# Patient Record
Sex: Female | Born: 1950 | Race: White | Hispanic: No | Smoking: Former smoker
Health system: Southern US, Community
[De-identification: ages and names within clinical notes are randomized; demographics above are authoritative.]

## PROBLEM LIST (undated history)

## (undated) DIAGNOSIS — E119 Type 2 diabetes mellitus without complications: Secondary | ICD-10-CM

## (undated) DIAGNOSIS — T7840XA Allergy, unspecified, initial encounter: Secondary | ICD-10-CM

## (undated) DIAGNOSIS — R011 Cardiac murmur, unspecified: Secondary | ICD-10-CM

## (undated) DIAGNOSIS — M1990 Unspecified osteoarthritis, unspecified site: Secondary | ICD-10-CM

## (undated) DIAGNOSIS — I10 Essential (primary) hypertension: Secondary | ICD-10-CM

## (undated) HISTORY — DX: Unspecified osteoarthritis, unspecified site: M19.90

## (undated) HISTORY — DX: Cardiac murmur, unspecified: R01.1

## (undated) HISTORY — PX: TONSILLECTOMY: SUR1361

## (undated) HISTORY — DX: Type 2 diabetes mellitus without complications: E11.9

## (undated) HISTORY — DX: Essential (primary) hypertension: I10

## (undated) HISTORY — DX: Allergy, unspecified, initial encounter: T78.40XA

## (undated) HISTORY — PX: MEDIAL PARTIAL KNEE REPLACEMENT: SHX5965

## (undated) HISTORY — PX: JOINT REPLACEMENT: SHX530

## (undated) HISTORY — PX: HAMMER TOE SURGERY: SHX385

---

## 2014-09-30 LAB — BASIC METABOLIC PANEL
BUN: 18 mg/dL (ref 4–21)
Creatinine: 0.7 mg/dL (ref 0.5–1.1)
Glucose: 123 mg/dL
Potassium: 4.9 mmol/L (ref 3.4–5.3)
SODIUM: 143 mmol/L (ref 137–147)

## 2014-09-30 LAB — VITAMIN D 25 HYDROXY (VIT D DEFICIENCY, FRACTURES): Vit D, 25-Hydroxy: 14.1

## 2014-09-30 LAB — HEMOGLOBIN A1C: Hemoglobin A1C: 6.6

## 2014-09-30 LAB — CBC AND DIFFERENTIAL
HCT: 41 % (ref 36–46)
HEMOGLOBIN: 14.1 g/dL (ref 12.0–16.0)
NEUTROS ABS: 6 /uL
PLATELETS: 288 10*3/uL (ref 150–399)
WBC: 8.6 10*3/mL

## 2014-09-30 LAB — HEPATIC FUNCTION PANEL
ALK PHOS: 55 U/L (ref 25–125)
ALT: 35 U/L (ref 7–35)
AST: 33 U/L (ref 13–35)
Bilirubin, Total: 0.7 mg/dL

## 2014-09-30 LAB — LIPID PANEL
CHOLESTEROL: 211 mg/dL — AB (ref 0–200)
HDL: 44 mg/dL (ref 35–70)
LDL CALC: 130 mg/dL
TRIGLYCERIDES: 185 mg/dL — AB (ref 40–160)

## 2014-09-30 LAB — TSH: TSH: 2.62 u[IU]/mL (ref 0.41–5.90)

## 2014-11-11 LAB — HM DEXA SCAN: HM Dexa Scan: NORMAL

## 2016-08-11 LAB — BASIC METABOLIC PANEL
BUN: 21 mg/dL (ref 4–21)
Creatinine: 0.8 mg/dL (ref 0.5–1.1)
GLUCOSE: 109 mg/dL
POTASSIUM: 4.4 mmol/L (ref 3.4–5.3)
Sodium: 138 mmol/L (ref 137–147)

## 2016-08-11 LAB — TSH: TSH: 3.23 u[IU]/mL (ref 0.41–5.90)

## 2016-08-11 LAB — CBC AND DIFFERENTIAL
HEMATOCRIT: 43 % (ref 36–46)
Hemoglobin: 15 g/dL (ref 12.0–16.0)
NEUTROS ABS: 5 /uL
PLATELETS: 284 10*3/uL (ref 150–399)
WBC: 7.3 10^3/mL

## 2016-08-11 LAB — HEMOGLOBIN A1C: Hemoglobin A1C: 7.4

## 2016-08-11 LAB — HEPATIC FUNCTION PANEL
ALT: 37 U/L — AB (ref 7–35)
AST: 33 U/L (ref 13–35)
Alkaline Phosphatase: 62 U/L (ref 25–125)
Bilirubin, Total: 0.9 mg/dL

## 2016-08-11 LAB — LIPID PANEL
CHOLESTEROL: 199 mg/dL (ref 0–200)
HDL: 38 mg/dL (ref 35–70)
LDL CALC: 122 mg/dL
Triglycerides: 193 mg/dL — AB (ref 40–160)

## 2016-08-11 LAB — VITAMIN D 25 HYDROXY (VIT D DEFICIENCY, FRACTURES): Vit D, 25-Hydroxy: 16.8

## 2016-11-24 ENCOUNTER — Emergency Department
Admission: EM | Admit: 2016-11-24 | Discharge: 2016-11-24 | Disposition: A | Discharge status: Home / Self Care | Payer: Managed Care, Other (non HMO) | Source: Home / Self Care | Attending: Family Medicine | Admitting: Family Medicine

## 2016-11-24 ENCOUNTER — Encounter: Payer: Self-pay | Admitting: Emergency Medicine

## 2016-11-24 DIAGNOSIS — Z23 Encounter for immunization: Secondary | ICD-10-CM

## 2016-11-24 DIAGNOSIS — L03032 Cellulitis of left toe: Secondary | ICD-10-CM | POA: Diagnosis not present

## 2016-11-24 MED ORDER — TETANUS-DIPHTH-ACELL PERTUSSIS 5-2.5-18.5 LF-MCG/0.5 IM SUSP
0.5000 mL | Freq: Once | INTRAMUSCULAR | Status: AC
  Administered 2016-11-24: 0.5 mL via INTRAMUSCULAR

## 2016-11-24 MED ORDER — CLINDAMYCIN HCL 300 MG PO CAPS: 300.0000 mg | ORAL_CAPSULE | Freq: Three times a day (TID) | ORAL | 0 refills | Status: DC

## 2016-11-24 MED ORDER — MUPIROCIN 2 % EX OINT: 1.0000 "application " | TOPICAL_OINTMENT | Freq: Three times a day (TID) | CUTANEOUS | 0 refills | Status: DC

## 2016-11-24 NOTE — Discharge Instructions (Signed)
Change bandage daily.  Elevate foot/leg.  May apply heating pad 2 tor 3 times daily. If symptoms become significantly worse during the night or over the weekend, proceed to the local emergency room.

## 2016-11-24 NOTE — ED Triage Notes (Signed)
Pt hit his left pinky toe getting out of the bed this morning, painful and some swelling.

## 2016-11-24 NOTE — ED Provider Notes (Signed)
[facility]    CSN: [id number] Arrival date & time: 11/24/16  1400     History   Chief Complaint Chief Complaint  Patient presents with  . Toe Pain    HPI Teresa Spence is a 66 y.o. female.   Patient just moved to the area from [place].  She was unpacking boxes earlier this week, wearing "sneakers."  Four days ago she felt a pinching sensation in her left fifth toe and later noticed a dorsal blister.  The blister has persisted and continues to drain.  Today she had increased soreness and some redness on her left dorsal foot.  She recalls having some chills and fatigue two days ago.  She notes that she has chronic decreased sensation in her feet. She has a history of hypertension and type 2 diabetes.  She has not yet established care with a local PCP. Past history of resection neuroma left foot.   The history is provided by the patient.  Foot Pain  This is a new problem. The current episode started more than 2 days ago. The problem occurs constantly. The problem has been rapidly worsening. The symptoms are aggravated by walking. Nothing relieves the symptoms. Treatments tried: bandage. The treatment provided no relief.    History reviewed. No pertinent past medical history.  There are no active problems to display for this patient.   Past Surgical History:  Procedure Laterality Date  . MEDIAL PARTIAL KNEE REPLACEMENT      OB History    No data available       Home Medications    Prior to Admission medications   Medication Sig Start Date End Date Taking? Authorizing Provider  BISOPROLOL FUMARATE PO Take by mouth.   Yes Historical Provider, MD  Losartan Potassium-HCTZ (HYZAAR PO) Take by mouth.   Yes Historical Provider, MD  meloxicam (MOBIC) 15 MG tablet Take 15 mg by mouth daily.   Yes Historical Provider, MD  metFORMIN (GLUCOPHAGE) 1000 MG tablet Take 1,000 mg by mouth 2 (two) times daily with a meal.   Yes Historical Provider, MD  clindamycin (CLEOCIN) 300  MG capsule Take 1 capsule (300 mg total) by mouth 3 (three) times daily. 11/24/16   Kandra Nicolas, MD  mupirocin ointment (BACTROBAN) 2 % Apply 1 application topically 3 (three) times daily. 11/24/16   Kandra Nicolas, MD    Family History Family History  Problem Relation Age of Onset  . Heart attack Brother   . Emphysema Neg Hx     Social History Social History  Substance Use Topics  . Smoking status: Former Research scientist (life sciences)  . Smokeless tobacco: Never Used  . Alcohol use Yes     Comment: 1-2 weekly     Allergies   Patient has no known allergies.   Review of Systems Review of Systems  Constitutional: Positive for chills and fatigue. Negative for activity change, appetite change, diaphoresis and fever.  Neurological: Positive for numbness.  All other systems reviewed and are negative.    Physical Exam Triage Vital Signs ED Triage Vitals  Enc Vitals Group     BP 11/24/16 1431 138/73     Pulse Rate 11/24/16 1431 75     Resp --      Temp 11/24/16 1431 98.3 F (36.8 C)     Temp Source 11/24/16 1431 Oral     SpO2 11/24/16 1431 93 %     Weight 11/24/16 1432 199 lb 12 oz (90.6 kg)     Height  11/24/16 1432 5\' 7"  (1.702 m)     Head Circumference --      Peak Flow --      Pain Score 11/24/16 1436 0     Pain Loc --      Pain Edu? --      Excl. in [place]? --    No data found.   Updated Vital Signs BP (!) 143/94 (BP Location: Left Arm)   Pulse 72   Temp 98.1 F (36.7 C) (Oral)   Ht 5\' 11"  (1.803 m)   Wt 272 lb 4 oz (123.5 kg)   SpO2 93%   BMI 37.97 kg/m   Visual Acuity Right Eye Distance:   Left Eye Distance:   Bilateral Distance:    Right Eye Near:   Left Eye Near:    Bilateral Near:     Physical Exam  Constitutional: She appears well-developed and well-nourished. No distress.  HENT:  Head: Normocephalic.  Mouth/Throat: Oropharynx is clear and moist.  Eyes: Pupils are equal, round, and reactive to light.  Cardiovascular: Normal rate.   Pulmonary/Chest: Effort  normal.  Musculoskeletal:       Left foot: There is swelling and decreased capillary refill. There is normal range of motion, no tenderness and no bony tenderness.       Feet:  Dorsum of left fifth toe has large bulla containing cloudy fluid.  Erythema extends onto dorsum of distal foot as noted on diagram.  Mild generalized edema of foot.  Neurological: She is alert.  Skin: Skin is warm and dry.  Nursing note and vitals reviewed.    UC Treatments / Results  Labs (all labs ordered are listed, but only abnormal results are displayed) Labs Reviewed  WOUND CULTURE    EKG  EKG Interpretation None       Radiology No results found.  Procedures Procedures  Debridement of left fifth toe.   With sterile technique, cleaned left fifth toe with Betadine and lavaged with saline.  Debrided bulla from dorsum of toe.  Applied Bacitracin ointment followed by wrap of xeroform gauze and Kerlix gauze. Wound culture pending.  Medications Ordered in UC Medications  Tdap (BOOSTRIX) injection 0.5 mL (not administered)     Initial Impression / Assessment and Plan / UC Course  I have reviewed the triage vital signs and the nursing notes.  Pertinent labs & imaging results that were available during my care of the patient were reviewed by me and considered in my medical decision making (see chart for details).    Wound culture pending.  Begin Clindamycin and topical bactroban ointment. Administered Tdap  Change bandage daily.  Elevate foot/leg.  May apply heating pad 2 tor 3 times daily. If symptoms become significantly worse during the night or over the weekend, proceed to the local emergency room.  Followup with Family Doctor in 75 hours (or may return to urgent care clinic). Followup with PCP to establish care.    Final Clinical Impressions(s) / UC Diagnoses   Final diagnoses:  Cellulitis of small toe of left foot    New Prescriptions New Prescriptions   CLINDAMYCIN (CLEOCIN) 300  MG CAPSULE    Take 1 capsule (300 mg total) by mouth 3 (three) times daily.   MUPIROCIN OINTMENT (BACTROBAN) 2 %    Apply 1 application topically 3 (three) times daily.     Kandra Nicolas, MD 11/24/16 [phone]

## 2016-11-24 NOTE — ED Triage Notes (Signed)
Pt c/o a large blister on left pinky toe x 3 days, no real pain, some bleeding and redness.

## 2016-11-26 ENCOUNTER — Emergency Department (INDEPENDENT_AMBULATORY_CARE_PROVIDER_SITE_OTHER)
Admission: EM | Admit: 2016-11-26 | Discharge: 2016-11-26 | Disposition: A | Discharge status: Home / Self Care | Payer: Managed Care, Other (non HMO) | Source: Home / Self Care | Attending: Family Medicine | Admitting: Family Medicine

## 2016-11-26 DIAGNOSIS — Z4800 Encounter for change or removal of nonsurgical wound dressing: Secondary | ICD-10-CM

## 2016-11-26 DIAGNOSIS — Z5189 Encounter for other specified aftercare: Secondary | ICD-10-CM | POA: Diagnosis not present

## 2016-11-26 NOTE — ED Provider Notes (Signed)
CSN: [id number]     Arrival date & time 11/26/16  9735 History   First MD Initiated Contact with Patient 11/26/16 [phone]     Chief Complaint  Patient presents with  . Wound Check   (Consider location/radiation/quality/duration/timing/severity/associated sxs/prior Treatment) HPI Teresa Spence is a 66 y.o. female presenting to UC for a wound recheck and bandage change.  She was seen at [facility] on 11/24/16 with a large blister to her Left little toe as well as redness of foot, fatigue, and chills.  She has been taking Clindamycin and using mupirocin ointment with mild relief. Chills and fatigue have seemed to resolve and wound appears to be a little more dry than it was but the redness looks about the same.  She recently moved from [place] and is in the process of moving so it is difficult for her to find time to keep her foot elevated.  She is leaving later today for a trip to [place] but will be back by the end of the week. No other concerns.    History reviewed. No pertinent past medical history. Past Surgical History:  Procedure Laterality Date  . MEDIAL PARTIAL KNEE REPLACEMENT     Family History  Problem Relation Age of Onset  . Heart attack Brother   . Emphysema Neg Hx    Social History  Substance Use Topics  . Smoking status: Former Research scientist (life sciences)  . Smokeless tobacco: Never Used  . Alcohol use Yes     Comment: 1-2 weekly   OB History    No data available     Review of Systems  Constitutional: Negative for chills, fatigue and fever.  Gastrointestinal: Negative for nausea and vomiting.  Skin: Positive for color change and wound.    Allergies  Patient has no known allergies.  Home Medications   Prior to Admission medications   Medication Sig Start Date End Date Taking? Authorizing Provider  BISOPROLOL FUMARATE PO Take by mouth.    Historical Provider, MD  clindamycin (CLEOCIN) 300 MG capsule Take 1 capsule (300 mg total) by mouth 3 (three) times daily. 11/24/16   Kandra Nicolas, MD   Losartan Potassium-HCTZ (HYZAAR PO) Take by mouth.    Historical Provider, MD  meloxicam (MOBIC) 15 MG tablet Take 15 mg by mouth daily.    Historical Provider, MD  metFORMIN (GLUCOPHAGE) 1000 MG tablet Take 1,000 mg by mouth 2 (two) times daily with a meal.    Historical Provider, MD  mupirocin ointment (BACTROBAN) 2 % Apply 1 application topically 3 (three) times daily. 11/24/16   Kandra Nicolas, MD   Meds Ordered and Administered this Visit  Medications - No data to display  BP 134/84 (BP Location: Left Arm)   Pulse 69   Temp 97.9 F (36.6 C) (Oral)   Ht 5\' 7"  (1.702 m)   Wt 199 lb (90.3 kg)   SpO2 97%   BMI 31.17 kg/m  No data found.   Physical Exam  Constitutional: She appears well-developed and well-nourished. No distress.  Cardiovascular: Normal rate.   Pulmonary/Chest: Effort normal. No respiratory distress.  Musculoskeletal: Normal range of motion. She exhibits no edema or tenderness.  Skin: Capillary refill takes less than 2 seconds. She is not diaphoretic. There is erythema.  Left foot, dorsal aspect: faint erythema over distal aspect of 3rd-5th metatarsals and Left 5th toe. Shallow open sore on Left 5th toe. Scant clear discharge. Non-tender. No induration or fluctuance.   Nursing note and vitals reviewed.   Urgent Care  Course     Procedures (including critical care time)  Labs Review Labs Reviewed - No data to display  Imaging Review No results found.  Old bandage removed, wound appears to be healing well.  Mupirocin ointment applied, covered with Xeroform and gauze.     MDM   1. Visit for wound check   2. Dressing change    Wound appears to be healing well. Dressing changed. Home care instructions provided. Pt will be out of town for 1 week. Encouraged to follow up with a medical provider when out of town if symptoms worsening. She may return to this urgent care as needed if not improving by the end of the week.     Noland Fordyce, PA-C 11/26/16  [phone]

## 2016-11-26 NOTE — Discharge Instructions (Signed)
° °  Be sure to  change the dressing at least once daily, more often if the bandage becomes dirty or wet.  You should also try to elevate your foot at least twice daily for 15-20 minutes at a time as well as apply a warm compress to help with blood flow and wound healing.   Please take antibiotics as prescribed and be sure to complete entire course even if you start to feel better to ensure infection does not come back.  If your wound appears to be healing well without concern, no need to return. If you are concerned it is not improving by the time you get back from your trip, either follow up with a medical provider in your area or you may come back to this urgent care when you return.

## 2016-11-26 NOTE — ED Triage Notes (Signed)
Pt seen here on the 17th with blister on left little toe.  Here today for a recheck.

## 2016-11-27 ENCOUNTER — Telehealth: Payer: Self-pay | Admitting: *Deleted

## 2016-11-27 LAB — WOUND CULTURE

## 2016-11-27 NOTE — Telephone Encounter (Signed)
Spoke to pt given Wcx results. She reports that her wound is looking better today.

## 2016-12-17 ENCOUNTER — Ambulatory Visit: Payer: Managed Care, Other (non HMO) | Admitting: Physician Assistant

## 2016-12-27 ENCOUNTER — Encounter: Payer: Self-pay | Admitting: Physician Assistant

## 2016-12-27 ENCOUNTER — Ambulatory Visit (INDEPENDENT_AMBULATORY_CARE_PROVIDER_SITE_OTHER): Payer: Managed Care, Other (non HMO) | Admitting: Physician Assistant

## 2016-12-27 ENCOUNTER — Ambulatory Visit (INDEPENDENT_AMBULATORY_CARE_PROVIDER_SITE_OTHER): Payer: Managed Care, Other (non HMO)

## 2016-12-27 VITALS — BP 138/77 | HR 88 | Ht 67.5 in | Wt 200.0 lb

## 2016-12-27 DIAGNOSIS — E119 Type 2 diabetes mellitus without complications: Secondary | ICD-10-CM

## 2016-12-27 DIAGNOSIS — R002 Palpitations: Secondary | ICD-10-CM | POA: Insufficient documentation

## 2016-12-27 DIAGNOSIS — M25572 Pain in left ankle and joints of left foot: Secondary | ICD-10-CM

## 2016-12-27 DIAGNOSIS — L03032 Cellulitis of left toe: Secondary | ICD-10-CM

## 2016-12-27 DIAGNOSIS — I10 Essential (primary) hypertension: Secondary | ICD-10-CM | POA: Diagnosis not present

## 2016-12-27 DIAGNOSIS — Z79899 Other long term (current) drug therapy: Secondary | ICD-10-CM

## 2016-12-27 DIAGNOSIS — IMO0002 Reserved for concepts with insufficient information to code with codable children: Secondary | ICD-10-CM | POA: Insufficient documentation

## 2016-12-27 DIAGNOSIS — Z131 Encounter for screening for diabetes mellitus: Secondary | ICD-10-CM | POA: Diagnosis not present

## 2016-12-27 DIAGNOSIS — R0989 Other specified symptoms and signs involving the circulatory and respiratory systems: Secondary | ICD-10-CM

## 2016-12-27 DIAGNOSIS — E1122 Type 2 diabetes mellitus with diabetic chronic kidney disease: Secondary | ICD-10-CM | POA: Insufficient documentation

## 2016-12-27 DIAGNOSIS — R230 Cyanosis: Secondary | ICD-10-CM

## 2016-12-27 DIAGNOSIS — M150 Primary generalized (osteo)arthritis: Secondary | ICD-10-CM

## 2016-12-27 DIAGNOSIS — Z13220 Encounter for screening for lipoid disorders: Secondary | ICD-10-CM

## 2016-12-27 DIAGNOSIS — M159 Polyosteoarthritis, unspecified: Secondary | ICD-10-CM

## 2016-12-27 MED ORDER — MELOXICAM 15 MG PO TABS: 15.0000 mg | ORAL_TABLET | Freq: Every day | ORAL | 5 refills | Status: DC

## 2016-12-27 NOTE — Patient Instructions (Signed)
Palpitations A palpitation is the feeling that your heart:  Has an uneven (irregular) heartbeat.  Is beating faster than normal.  Is fluttering.  Is skipping a beat. This is usually not a serious problem. In some cases, you may need more medical tests. Follow these instructions at home:  Avoid:  Caffeine in coffee, tea, soft drinks, diet pills, and energy drinks.  Chocolate.  Alcohol.  Do not use any tobacco products. These include cigarettes, chewing tobacco, and e-cigarettes. If you need help quitting, ask your doctor.  Try to reduce your stress. These things may help:  Yoga.  Meditation.  Physical activity. Swimming, jogging, and walking are good choices.  A method that helps you use your mind to control things in your body, like heartbeats (biofeedback).  Get plenty of rest and sleep.  Take over-the-counter and prescription medicines only as told by your doctor.  Keep all follow-up visits as told by your doctor. This is important. Contact a doctor if:  Your heartbeat is still fast or uneven after 24 hours.  Your palpitations occur more often. Get help right away if:  You have chest pain.  You feel short of breath.  You have a very bad headache.  You feel dizzy.  You pass out (faint). This information is not intended to replace advice given to you by your health care provider. Make sure you discuss any questions you have with your health care provider. Document Released: 06/05/2008 Document Revised: 02/02/2016 Document Reviewed: 05/12/2015 Elsevier Interactive Patient Education  2017 [employer].

## 2016-12-27 NOTE — Progress Notes (Signed)
Call pt: lots of osteoarthritis but no acute process detected.

## 2016-12-27 NOTE — Progress Notes (Signed)
Subjective:    Patient ID: Teresa Spence, female    DOB: [birth year], 66 y.o.   MRN: [id number]  HPI  Pt is a 66 yo female who presents to the clinic to establish care.   .. Active Ambulatory Problems    Diagnosis Date Noted  . Hypertension 12/27/2016  . Type 2 diabetes mellitus without complication, without long-term current use of insulin ([place]) 12/27/2016  . Palpitations 12/27/2016   Resolved Ambulatory Problems    Diagnosis Date Noted  . No Resolved Ambulatory Problems   Past Medical History:  Diagnosis Date  . Diabetes mellitus without complication ([place])   . Hypertension    .[name] Family History  Problem Relation Age of Onset  . Heart attack Brother   . Diabetes Mother   . Hypertension Mother   . Hypertension Father   . COPD Father   . Stroke Maternal Grandmother   . Heart attack Maternal Grandfather   . Emphysema Neg Hx    .[name] Social History   Social History  . Marital status: Married    Spouse name: N/A  . Number of children: N/A  . Years of education: N/A   Occupational History  . Not on file.   Social History Main Topics  . Smoking status: Former Research scientist (life sciences)  . Smokeless tobacco: Never Used  . Alcohol use Yes     Comment: 1-2 weekly  . Drug use: No  . Sexual activity: Yes   Other Topics Concern  . Not on file   Social History Narrative  . No narrative on file    She comes in to have her left pinky toe looked at due to recent cellulitis. Finished abx. No pain. She is a DM controlled with metformin.   She does need mobic refilled for OA all over body in knees, hands, feet.     Review of Systems  All other systems reviewed and are negative.      Objective:   Physical Exam  Constitutional: She appears well-developed and well-nourished.  HENT:  Head: Normocephalic and atraumatic.  Cardiovascular: Normal rate, regular rhythm and normal heart sounds.   Pulmonary/Chest: Effort normal and breath sounds normal.  Musculoskeletal:  Slightly swollen  purple left 5th metatarsal. No warmth or tenderness. No open lesions. Scab where blister healed.   Neurological:  Pedal pulses 2+ left,  Absent right.           Assessment & Plan:  [name] KitchenMarland KitchenLaruen was seen today for establish care.  Diagnoses and all orders for this visit:  Type 2 diabetes mellitus without complication, without long-term current use of insulin (H[facility]) -     Hemoglobin A1c -     DG Foot Complete Left; Future  Palpitations  Hypertension, unspecified type  Screening for diabetes mellitus -     COMPLETE METABOLIC PANEL WITH GFR  Screening for lipoid disorders -     Lipid panel  Medication management -     B12 -     VITAMIN D 25 Hydroxy (Vit-D Deficiency, Fractures)  Cellulitis of toe of left foot -     DG Foot Complete Left; Future  Primary osteoarthritis involving multiple joints -     meloxicam (MOBIC) 15 MG tablet; Take 1 tablet (15 mg total) by mouth daily.   BP went down on 2nd recheck. She has not had palpitations in months. HO given on triggers and prevention. She has had a full work up by cardiology before she moved down here.   Absent pulses in  right foot which is opposite of the cellulitis. Will get ABI's. She does state at times her right foot toes go "black". Likely raynauds. Discussed in office. Will get xray of left 5th metatarsal due to cellulitis and DM. Appears to be healing well.    Will come back for CPE to discuss health maintanence items. Pt feels like she has had most of it done just need to find documentation.

## 2016-12-29 DIAGNOSIS — L03032 Cellulitis of left toe: Secondary | ICD-10-CM | POA: Insufficient documentation

## 2016-12-29 DIAGNOSIS — R230 Cyanosis: Secondary | ICD-10-CM | POA: Insufficient documentation

## 2016-12-29 DIAGNOSIS — R0989 Other specified symptoms and signs involving the circulatory and respiratory systems: Secondary | ICD-10-CM | POA: Insufficient documentation

## 2017-01-03 ENCOUNTER — Ambulatory Visit (HOSPITAL_COMMUNITY)
Admission: RE | Admit: 2017-01-03 | Discharge: 2017-01-03 | Disposition: A | Discharge status: Home / Self Care | Payer: Managed Care, Other (non HMO) | Source: Ambulatory Visit | Attending: Physician Assistant | Admitting: Physician Assistant

## 2017-01-03 DIAGNOSIS — R230 Cyanosis: Secondary | ICD-10-CM | POA: Diagnosis present

## 2017-01-03 DIAGNOSIS — R0989 Other specified symptoms and signs involving the circulatory and respiratory systems: Secondary | ICD-10-CM | POA: Diagnosis present

## 2017-01-03 DIAGNOSIS — E119 Type 2 diabetes mellitus without complications: Secondary | ICD-10-CM | POA: Insufficient documentation

## 2017-01-03 NOTE — Progress Notes (Signed)
VASCULAR LAB PRELIMINARY  ARTERIAL  ABI completed:    RIGHT    LEFT    PRESSURE WAVEFORM  PRESSURE WAVEFORM  BRACHIAL 121 Triphasic BRACHIAL 127 Triphasic  DP 124 Triphasic DP 121 Biphasic  AT   AT    PT 148 Triphasic PT 137 Triphasic  PER   PER    GREAT TOE  NA GREAT TOE  NA    RIGHT LEFT  ABI 1.17 1.08   ABIs are within normal limits at rest bilaterally.  01/03/2017 10:02 AM Maudry Mayhew, BS, RVT, RDCS, RDMS

## 2017-01-15 ENCOUNTER — Encounter: Payer: Self-pay | Admitting: Physician Assistant

## 2017-02-01 ENCOUNTER — Encounter: Payer: Self-pay | Admitting: Physician Assistant

## 2017-04-05 ENCOUNTER — Encounter: Payer: Self-pay | Admitting: Emergency Medicine

## 2017-04-05 ENCOUNTER — Emergency Department
Admission: EM | Admit: 2017-04-05 | Discharge: 2017-04-05 | Disposition: A | Discharge status: Home / Self Care | Payer: Managed Care, Other (non HMO) | Source: Home / Self Care | Attending: Family Medicine | Admitting: Family Medicine

## 2017-04-05 DIAGNOSIS — R002 Palpitations: Secondary | ICD-10-CM

## 2017-04-05 DIAGNOSIS — Z760 Encounter for issue of repeat prescription: Secondary | ICD-10-CM

## 2017-04-05 DIAGNOSIS — Z8679 Personal history of other diseases of the circulatory system: Secondary | ICD-10-CM

## 2017-04-05 MED ORDER — BISOPROLOL FUMARATE 5 MG PO TABS: 5.0000 mg | ORAL_TABLET | Freq: Every day | ORAL | 0 refills | Status: DC

## 2017-04-05 MED ORDER — LOSARTAN POTASSIUM-HCTZ 100-25 MG PO TABS: 1.0000 | ORAL_TABLET | Freq: Every day | ORAL | 0 refills | Status: DC

## 2017-04-05 NOTE — ED Triage Notes (Signed)
Medication Refill for Bisoprolol 5mg , Losartan 100-25

## 2017-04-05 NOTE — ED Provider Notes (Signed)
CSN: [id number]     Arrival date & time 04/05/17  1928 History   First MD Initiated Contact with Patient 04/05/17 1955     Chief Complaint  Patient presents with  . Medication Refill   (Consider location/radiation/quality/duration/timing/severity/associated sxs/prior Treatment) HPI  Teresa Spence is a 66 y.o. female presenting to UC with request for a medication refill for her bisoprolol, which she takes for palpitations, and losartan-hydrochlorothiazide.  Pt has called her PCP office the last 3 days requesting refills but no one has called her back or called in refills to her pharmacy.  Pt has been out of her bisoprolol for 3 days and has started to have mild palpitations but denies chest pain or SOB. Pt split her Hyzaar in half yesterday in order to have some yesterday and today.  Denies HA or dizziness.    Past Medical History:  Diagnosis Date  . Diabetes mellitus without complication ([place])   . Hypertension    Past Surgical History:  Procedure Laterality Date  . HAMMER TOE SURGERY    . MEDIAL PARTIAL KNEE REPLACEMENT    . TONSILLECTOMY     Family History  Problem Relation Age of Onset  . Heart attack Brother   . Diabetes Mother   . Hypertension Mother   . Hypertension Father   . COPD Father   . Stroke Maternal Grandmother   . Heart attack Maternal Grandfather   . Emphysema Neg Hx    Social History  Substance Use Topics  . Smoking status: Former Research scientist (life sciences)  . Smokeless tobacco: Never Used  . Alcohol use Yes     Comment: 1-2 weekly   OB History    No data available     Review of Systems  Constitutional: Negative for chills and fever.  Respiratory: Negative for cough and shortness of breath.   Cardiovascular: Positive for palpitations. Negative for chest pain.  Gastrointestinal: Negative for abdominal pain, diarrhea, nausea and vomiting.  Musculoskeletal: Negative for arthralgias, back pain and myalgias.  Skin: Negative for rash.  Neurological: Negative for dizziness,  light-headedness and headaches.    Allergies  Patient has no known allergies.  Home Medications   Prior to Admission medications   Medication Sig Start Date End Date Taking? Authorizing Provider  meloxicam (MOBIC) 15 MG tablet Take 1 tablet (15 mg total) by mouth daily. 12/27/16  Yes Breeback, [name] L, PA-C  metFORMIN (GLUCOPHAGE) 1000 MG tablet Take 1,000 mg by mouth 2 (two) times daily with a meal.   Yes [provider]  bisoprolol (ZEBETA) 5 MG tablet Take 1 tablet (5 mg total) by mouth daily. 04/05/17   Noe Gens, PA-C  losartan-hydrochlorothiazide (HYZAAR) 100-25 MG tablet Take 1 tablet by mouth daily. 04/05/17   Noe Gens, PA-C   Meds Ordered and Administered this Visit  Medications - No data to display  BP (!) 162/101 (BP Location: Left Arm)   Pulse (!) 104   Temp 99.2 F (37.3 C) (Oral)   Resp 16   Ht 5\' 7"  (1.702 m)   Wt 200 lb (90.7 kg)   SpO2 94%   BMI 31.32 kg/m  No data found.   Physical Exam  Constitutional: She is oriented to person, place, and time. She appears well-developed and well-nourished. No distress.  HENT:  Head: Normocephalic and atraumatic.  Mouth/Throat: Oropharynx is clear and moist.  Eyes: EOM are normal.  Neck: Normal range of motion.  Cardiovascular: Normal rate and regular rhythm.   Pulmonary/Chest: Effort normal and breath  sounds normal. No respiratory distress. She has no wheezes. She has no rales.  Musculoskeletal: Normal range of motion.  Neurological: She is alert and oriented to person, place, and time.  Skin: Skin is warm and dry. She is not diaphoretic.  Psychiatric: She has a normal mood and affect. Her behavior is normal.  Nursing note and vitals reviewed.   Urgent Care Course     Procedures (including critical care time)  Labs Review Labs Reviewed - No data to display  Imaging Review No results found.   MDM   1. Medication refill   2. History of high blood pressure   3. Palpitations    Hx of  HTN and palpitations. Requesting refill on medications as she has tried to contact her PCP office the last 3 days w/o being able to get through or get a call back.  Pt has had mild palpitations since yesterday but denies chest pain or SOB.  30 day supply of bisoprolol and losartan-hydrochlorothiazide sent to pharmacy. Encouraged to f/u within next 30 days with her PCP. Encouraged to try using MyChart online portal as she may be able to get through to the office easier/faster that way.    Noe Gens, [place] 04/06/17 1004

## 2017-05-06 ENCOUNTER — Ambulatory Visit (INDEPENDENT_AMBULATORY_CARE_PROVIDER_SITE_OTHER): Payer: Managed Care, Other (non HMO) | Admitting: Physician Assistant

## 2017-05-06 ENCOUNTER — Encounter: Payer: Self-pay | Admitting: Physician Assistant

## 2017-05-06 VITALS — BP 129/52 | HR 73 | Temp 98.9°F | Ht 67.0 in | Wt 210.4 lb

## 2017-05-06 DIAGNOSIS — R200 Anesthesia of skin: Secondary | ICD-10-CM | POA: Diagnosis not present

## 2017-05-06 DIAGNOSIS — M159 Polyosteoarthritis, unspecified: Secondary | ICD-10-CM

## 2017-05-06 DIAGNOSIS — Z1231 Encounter for screening mammogram for malignant neoplasm of breast: Secondary | ICD-10-CM

## 2017-05-06 DIAGNOSIS — R202 Paresthesia of skin: Secondary | ICD-10-CM | POA: Diagnosis not present

## 2017-05-06 DIAGNOSIS — T17308D Unspecified foreign body in larynx causing other injury, subsequent encounter: Secondary | ICD-10-CM

## 2017-05-06 DIAGNOSIS — Z23 Encounter for immunization: Secondary | ICD-10-CM

## 2017-05-06 DIAGNOSIS — E119 Type 2 diabetes mellitus without complications: Secondary | ICD-10-CM | POA: Diagnosis not present

## 2017-05-06 DIAGNOSIS — I10 Essential (primary) hypertension: Secondary | ICD-10-CM | POA: Diagnosis not present

## 2017-05-06 DIAGNOSIS — M150 Primary generalized (osteo)arthritis: Secondary | ICD-10-CM

## 2017-05-06 LAB — POCT GLYCOSYLATED HEMOGLOBIN (HGB A1C): HEMOGLOBIN A1C: 6.9

## 2017-05-06 MED ORDER — PNEUMOCOCCAL 13-VAL CONJ VACC IM SUSP: 0.5000 mL | INTRAMUSCULAR | Status: DC

## 2017-05-06 MED ORDER — BISOPROLOL FUMARATE 5 MG PO TABS: 5.0000 mg | ORAL_TABLET | Freq: Every day | ORAL | 1 refills | Status: DC

## 2017-05-06 MED ORDER — MELOXICAM 15 MG PO TABS: 15.0000 mg | ORAL_TABLET | Freq: Every day | ORAL | 5 refills | Status: DC

## 2017-05-06 MED ORDER — PNEUMOCOCCAL 13-VAL CONJ VACC IM SUSP
0.5000 mL | Freq: Once | INTRAMUSCULAR | Status: AC
  Administered 2017-05-06: 0.5 mL via INTRAMUSCULAR

## 2017-05-06 MED ORDER — OMEPRAZOLE 40 MG PO CPDR: 40.0000 mg | DELAYED_RELEASE_CAPSULE | Freq: Every day | ORAL | 3 refills | Status: DC

## 2017-05-06 MED ORDER — METFORMIN HCL 1000 MG PO TABS: 1000.0000 mg | ORAL_TABLET | Freq: Two times a day (BID) | ORAL | 1 refills | Status: DC

## 2017-05-06 MED ORDER — LOSARTAN POTASSIUM-HCTZ 100-25 MG PO TABS: 1.0000 | ORAL_TABLET | Freq: Every day | ORAL | 1 refills | Status: DC

## 2017-05-06 NOTE — Patient Instructions (Addendum)
Start omeprazole.   Carpal Tunnel Syndrome Carpal tunnel syndrome is a condition that causes pain in your hand and arm. The carpal tunnel is a narrow area that is on the palm side of your wrist. Repeated wrist motion or certain diseases may cause swelling in the tunnel. This swelling can pinch the main nerve in the wrist (median nerve). Follow these instructions at home: If you have a splint:  Wear it as told by your doctor. Remove it only as told by your doctor.  Loosen the splint if your fingers: ? Become numb and tingle. ? Turn blue and cold.  Keep the splint clean and dry. General instructions  Take over-the-counter and prescription medicines only as told by your doctor.  Rest your wrist from any activity that may be causing your pain. If needed, talk to your employer about changes that can be made in your work, such as getting a wrist pad to use while typing.  If directed, apply ice to the painful area: ? Put ice in a plastic bag. ? Place a towel between your skin and the bag. ? Leave the ice on for 20 minutes, 2-3 times per day.  Keep all follow-up visits as told by your doctor. This is important.  Do any exercises as told by your doctor, physical therapist, or occupational therapist. Contact a doctor if:  You have new symptoms.  Medicine does not help your pain.  Your symptoms get worse. This information is not intended to replace advice given to you by your health care provider. Make sure you discuss any questions you have with your health care provider. Document Released: 08/16/2011 Document Revised: 02/02/2016 Document Reviewed: 01/12/2015 Elsevier Interactive Patient Education  [employer].

## 2017-05-06 NOTE — Progress Notes (Signed)
Subjective:    Patient ID: Teresa Spence, female    DOB: [birth year], 66 y.o.   MRN: [id number]  HPI  Pt is a 66 yo female who presents to the clinic for follow up  DM- on metformin. Not checking sugars. No overt problems with vision or open sores.   HTN- no problems or concerns. No CP, palpitations, headaches.   Pt is having 2 problems. She is having some bilateral numbness and tingling in both hands and fingers with weakness. Noticed ongoing. Seems to be worse in the mornings. No known injury. She works with quilts and typing all day.   She is also for the last 6 months notice that she has been choking or meats. Seems to be only at dinner and only has happened at her home. When they eat out does not happen. She has noticed chicken more than any other meat. She denies any problems swallowing.   .. Active Ambulatory Problems    Diagnosis Date Noted  . Hypertension 12/27/2016  . Type 2 diabetes mellitus without complication, without long-term current use of insulin ([place]) 12/27/2016  . Palpitations 12/27/2016  . Cellulitis of toe of left foot 12/29/2016  . 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 Absent pedal pulses 12/29/2016   Resolved Ambulatory Problems    Diagnosis Date Noted  . No Resolved Ambulatory Problems   Past Medical History:  Diagnosis Date  . Diabetes mellitus without complication ([place])   . Hypertension         Review of Systems See HPI     Objective:   Physical Exam  Constitutional: She is oriented to person, place, and time. She appears well-developed and well-nourished.  HENT:  Head: Normocephalic and atraumatic.  Neck: Normal range of motion. Neck supple. No thyromegaly present.  Cardiovascular: Normal rate, regular rhythm and normal heart sounds.   Pulmonary/Chest: Effort normal and breath sounds normal.  Musculoskeletal:  Normal ROM and strength of bilateral wrist.  Positive tinels bilaterally.  Positive phalens bilateral in the tips of all fingers.  Negative  finkelstein.   Lymphadenopathy:    She has no cervical adenopathy.  Neurological: She is alert and oriented to person, place, and time.  Psychiatric: She has a normal mood and affect. Her behavior is normal.          Assessment & Plan:  [name] KitchenMarland KitchenKyiah was seen today for medication refill.  Diagnoses and all orders for this visit:  Type 2 diabetes mellitus without complication, without long-term current use of insulin (HCC) -     POCT HgB A1C -     metFORMIN (GLUCOPHAGE) 1000 MG tablet; Take 1 tablet (1,000 mg total) by mouth 2 (two) times daily with a meal.  Primary osteoarthritis involving multiple joints -     meloxicam (MOBIC) 15 MG tablet; Take 1 tablet (15 mg total) by mouth daily.  Visit for screening mammogram -     MM DIAG BREAST TOMO BILATERAL; Future -     MM DIAG BREAST TOMO BILATERAL  Choking, subsequent encounter -     omeprazole (PRILOSEC) 40 MG capsule; Take 1 capsule (40 mg total) by mouth daily.  Numbness and tingling in both hands  Need for pneumococcal vaccine -     Discontinue: pneumococcal 13-valent conjugate vaccine (PREVNAR 13) injection 0.5 mL; Inject 0.5 mLs into the muscle tomorrow at 10 am. -     pneumococcal 13-valent conjugate vaccine (PREVNAR 13) injection 0.5 mL; Inject 0.5 mLs into the muscle once.  Essential hypertension -  losartan-hydrochlorothiazide (HYZAAR) 100-25 MG tablet; Take 1 tablet by mouth daily. -     bisoprolol (ZEBETA) 5 MG tablet; Take 1 tablet (5 mg total) by mouth daily.    Lab Results  Component Value Date   HGBA1C 6.9 05/06/2017   At goal today.  Continue metformin.  Needs annual eye exam.  prevnar 13 given today.  Pt declines flu shot.   Pt discussed she will have colonoscopy in march of next year.  Mammogram ordered.   BP controlled and at goal.   Numbness and tingling in hands sounds like carpel tunnel. Encouraged night splints, icing, rest, mobic. If not improving could schedule EMG's  Choking- only with  meats and only at home. Will try PPI to see if helps in next 2-3 months. If not will make referral to GI.

## 2017-05-07 ENCOUNTER — Encounter: Payer: Self-pay | Admitting: Physician Assistant

## 2017-05-07 NOTE — Progress Notes (Signed)
Spoke with Pharmacy, they did receive Rx.

## 2017-05-08 DIAGNOSIS — M159 Polyosteoarthritis, unspecified: Secondary | ICD-10-CM | POA: Insufficient documentation

## 2017-05-08 DIAGNOSIS — T17308A Unspecified foreign body in larynx causing other injury, initial encounter: Secondary | ICD-10-CM | POA: Insufficient documentation

## 2017-05-08 DIAGNOSIS — R200 Anesthesia of skin: Secondary | ICD-10-CM | POA: Insufficient documentation

## 2017-05-08 DIAGNOSIS — M150 Primary generalized (osteo)arthritis: Secondary | ICD-10-CM

## 2017-05-08 DIAGNOSIS — R202 Paresthesia of skin: Secondary | ICD-10-CM

## 2017-06-10 ENCOUNTER — Encounter: Payer: Self-pay | Admitting: Physician Assistant

## 2017-06-10 ENCOUNTER — Ambulatory Visit (INDEPENDENT_AMBULATORY_CARE_PROVIDER_SITE_OTHER): Payer: Managed Care, Other (non HMO) | Admitting: Physician Assistant

## 2017-06-10 VITALS — BP 130/82 | HR 68 | Ht 67.0 in | Wt 213.0 lb

## 2017-06-10 DIAGNOSIS — S90222A Contusion of left lesser toe(s) with damage to nail, initial encounter: Secondary | ICD-10-CM | POA: Diagnosis not present

## 2017-06-10 DIAGNOSIS — Z131 Encounter for screening for diabetes mellitus: Secondary | ICD-10-CM

## 2017-06-10 DIAGNOSIS — Z13820 Encounter for screening for osteoporosis: Secondary | ICD-10-CM | POA: Diagnosis not present

## 2017-06-10 DIAGNOSIS — Z13220 Encounter for screening for lipoid disorders: Secondary | ICD-10-CM | POA: Diagnosis not present

## 2017-06-10 DIAGNOSIS — R5383 Other fatigue: Secondary | ICD-10-CM

## 2017-06-10 DIAGNOSIS — E119 Type 2 diabetes mellitus without complications: Secondary | ICD-10-CM

## 2017-06-10 NOTE — Progress Notes (Addendum)
   Subjective:    Patient ID: Teresa Spence, female    DOB: Jan 24, 1951, 66 y.o.   MRN: [id number]  HPI The patient is a 66 year old female who presents today with a chief complaint of blood under the left big toe. She reports that she had a pedicure two to three weeks ago. The day after the pedicure she noticed what she believed was blood around the cuticle. She states that she assumed her cuticle was cut too short during her pedicure. Two days ago she noted a black line on the nail where her nail polish had begun to grow out. She removed her nail polish last night and believed that she had blood under the nail. She denies swelling, warmth, or redness of the surrounding tissue. She denies injury to the foot, toe, or nail itself.   She reports a history of fungal nail infection one year ago. She states she was treated with Tolnaftate 1% topical cream. She believed that the fungus resolved after several weeks of treatment.   .. Active Ambulatory Problems    Diagnosis Date Noted  . Hypertension 12/27/2016  . Type 2 diabetes mellitus without complication, without long-term current use of insulin ([place]) 12/27/2016  . Palpitations 12/27/2016  . Cellulitis of toe of left foot 12/29/2016  . 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 Absent pedal pulses 12/29/2016  . Choking 05/08/2017  . Numbness and tingling in both hands 05/08/2017  . Primary osteoarthritis involving multiple joints 05/08/2017   Resolved Ambulatory Problems    Diagnosis Date Noted  . No Resolved Ambulatory Problems   Past Medical History:  Diagnosis Date  . Diabetes mellitus without complication ([place])   . Hypertension       Review of Systems  All other systems reviewed and are negative.      Objective:   Physical Exam  Constitutional: She is oriented to person, place, and time. She appears well-developed and well-nourished.  Cardiovascular: Normal rate, regular rhythm and normal heart sounds.   Neurological: She is alert and  oriented to person, place, and time.  Skin: Skin is warm and dry.  Subungual hematoma of the left hallux noted on visual inspection.  No tenderness to palpation. No surrounding tissue erythema, warmth, or swelling.   Thick left toenail with yellow overall tint.       Assessment & Plan:  [name] KitchenMarland KitchenDiagnoses and all orders for this visit:  Toenail bruise, left, initial encounter  Screening for lipid disorders -     Lipid Panel w/reflex Direct LDL  Screening for diabetes mellitus -     COMPLETE METABOLIC PANEL WITH GFR  Diabetes mellitus without complication (HCC) -     Hemoglobin A1c  Osteoporosis screening -     VITAMIN D 25 Hydroxy (Vit-D Deficiency, Fractures)  No energy -     Vitamin B12  I suspect that the patient has a subungual hematoma of the left hallux.pt denies any trauma to nailbed other than nic with pedicure. We will send a picture to Dr. Caffie Pinto to confirm. At this time I do not believe that treatment is necessary due to patient not having any concerning symptoms. . We will monitor the nail growth and follow up if healthy nail does not begin to grow from the nail bed.  We will start oral Lamisil pending liver enzyme levels for onychomycosis of the left hallux. Lab orders were re-printed today and she will complete fasting labs.

## 2017-06-11 ENCOUNTER — Encounter: Payer: Self-pay | Admitting: Podiatry

## 2017-06-12 NOTE — Progress Notes (Signed)
Will communicate this to patient that our podiatrist recommendation is: see note attached regarding her toenail.

## 2017-06-13 LAB — VITAMIN B12: Vitamin B-12: 500 pg/mL (ref 200–1100)

## 2017-06-13 LAB — COMPLETE METABOLIC PANEL WITH GFR
AG RATIO: 1.8 (calc) (ref 1.0–2.5)
ALBUMIN MSPROF: 4.3 g/dL (ref 3.6–5.1)
ALKALINE PHOSPHATASE (APISO): 54 U/L (ref 33–130)
ALT: 25 U/L (ref 6–29)
AST: 21 U/L (ref 10–35)
BUN: 20 mg/dL (ref 7–25)
CALCIUM: 9.6 mg/dL (ref 8.6–10.4)
CO2: 29 mmol/L (ref 20–32)
CREATININE: 0.85 mg/dL (ref 0.50–0.99)
Chloride: 101 mmol/L (ref 98–110)
GFR, EST NON AFRICAN AMERICAN: 71 mL/min/{1.73_m2} (ref >=60)
GFR, Est African American: 82 mL/min/{1.73_m2} (ref >=60)
GLOBULIN: 2.4 g/dL (ref 1.9–3.7)
Glucose, Bld: 174 mg/dL — ABNORMAL HIGH (ref 65–99)
POTASSIUM: 4.3 mmol/L (ref 3.5–5.3)
SODIUM: 139 mmol/L (ref 135–146)
Total Bilirubin: 0.6 mg/dL (ref 0.2–1.2)
Total Protein: 6.7 g/dL (ref 6.1–8.1)

## 2017-06-13 LAB — HEMOGLOBIN A1C
Hgb A1c MFr Bld: 7.3 % of total Hgb — ABNORMAL HIGH (ref <5.7)
Mean Plasma Glucose: 163 (calc)
eAG (mmol/L): 9 (calc)

## 2017-06-13 LAB — LIPID PANEL W/REFLEX DIRECT LDL
CHOLESTEROL: 183 mg/dL (ref <200)
HDL: 31 mg/dL — AB (ref >50)
LDL CHOLESTEROL (CALC): 117 mg/dL — AB
Non-HDL Cholesterol (Calc): 152 mg/dL (calc) — ABNORMAL HIGH (ref <130)
Total CHOL/HDL Ratio: 5.9 (calc) — ABNORMAL HIGH (ref <5.0)
Triglycerides: 228 mg/dL — ABNORMAL HIGH (ref <150)

## 2017-06-13 LAB — VITAMIN D 25 HYDROXY (VIT D DEFICIENCY, FRACTURES): Vit D, 25-Hydroxy: 22 ng/mL — ABNORMAL LOW (ref 30–100)

## 2017-06-14 NOTE — Progress Notes (Signed)
Call pt: A!C is positive for diabetes. Please schedule a time where we can discuss diagnoses and protocols and increased risk. LDL not at goal consider DM. Really needs statin. I would like to talk to you about all of this.  Vitamin D is low D3 2000 units daily.  Liver looks fine. Would you like to start lamsil for toenail fungus?

## 2017-06-17 MED ORDER — TERBINAFINE HCL 250 MG PO TABS: 250.0000 mg | ORAL_TABLET | Freq: Every day | ORAL | 0 refills | Status: DC

## 2017-06-17 MED ORDER — ATORVASTATIN CALCIUM 40 MG PO TABS: 40.0000 mg | ORAL_TABLET | Freq: Every day | ORAL | 1 refills | Status: DC

## 2017-06-17 NOTE — Addendum Note (Signed)
Addended by: Donella Stade on: 06/17/2017 09:13 AM   Modules accepted: Orders

## 2017-06-17 NOTE — Progress Notes (Signed)
Sent medications.

## 2017-07-03 ENCOUNTER — Telehealth: Payer: Self-pay | Admitting: Physician Assistant

## 2017-07-03 DIAGNOSIS — S90222D Contusion of left lesser toe(s) with damage to nail, subsequent encounter: Secondary | ICD-10-CM

## 2017-07-03 NOTE — Telephone Encounter (Signed)
Patient left vm advising her toe isnt any better and would like to see podiartrist.

## 2017-07-03 NOTE — Telephone Encounter (Signed)
made

## 2017-07-08 ENCOUNTER — Ambulatory Visit (INDEPENDENT_AMBULATORY_CARE_PROVIDER_SITE_OTHER): Payer: Managed Care, Other (non HMO) | Admitting: Podiatry

## 2017-07-08 ENCOUNTER — Encounter: Payer: Self-pay | Admitting: Podiatry

## 2017-07-08 VITALS — BP 142/78 | HR 69 | Ht 67.0 in | Wt 203.0 lb

## 2017-07-08 DIAGNOSIS — M2041 Other hammer toe(s) (acquired), right foot: Secondary | ICD-10-CM | POA: Diagnosis not present

## 2017-07-08 DIAGNOSIS — E0842 Diabetes mellitus due to underlying condition with diabetic polyneuropathy: Secondary | ICD-10-CM | POA: Diagnosis not present

## 2017-07-08 DIAGNOSIS — B351 Tinea unguium: Secondary | ICD-10-CM | POA: Diagnosis not present

## 2017-07-08 NOTE — Progress Notes (Signed)
SUBJECTIVE: 66 y.o. year old female presents complaining of problematic toe nails. She noted of left great toe nail turning dark brown 2 months ago, which was after a visit to her nail salon. Stated that she was prescribed with Lamisil and been taking for the last 2 weeks. She is also been getting upset stomach, diarrhea, and excess gas since taking Lamisil. Last A1C was over 7.  Hx of left foot surgery, neuroma removal, hammer toe repair 2nd and 3rd left. Diagnosed with Osteoarthritis of hands, knee, and feet. Heart palpitation. Bilateral knee surgery 2008 and 2009.  Review of Systems  Constitutional: Negative.   HENT: Negative.   Respiratory: Negative.   Cardiovascular: Negative.   Gastrointestinal: Positive for diarrhea. Negative for abdominal pain, heartburn, nausea and vomiting.  Genitourinary: Negative.   Musculoskeletal: Positive for joint pain. Negative for back pain and neck pain.  Neurological: Negative.   Psychiatric/Behavioral: Negative.    OBJECTIVE: DERMATOLOGIC EXAMINATION: Brown discolored nail left great toe at proximal 1/4. Thick and mild discolored both great toe nails.  VASCULAR EXAMINATION OF LOWER LIMBS: All pedal pulses are palpable with normal pulsation.  Capillary Filling times within 3 seconds in all digits.  No edema or erythema noted. Temperature gradient from tibial crest to dorsum of foot is within normal bilateral.  NEUROLOGIC EXAMINATION OF THE LOWER LIMBS: Achilles DTR is present and within normal. Failed to respond to Monofilament (Semmes-Weinstein 10-gm) sensory testing bilateral. Vibratory sensations(128Hz  turning fork) intact at medial and lateral forefoot bilateral.  Sharp and Dull discriminatory sensations at the plantar ball of hallux is intact bilateral.   MUSCULOSKELETAL EXAMINATION: Positive for hallux valgus with bunion deformity left. Severe contracted 2nd digit right with hyperemia at IPJ dorsum. Post surgical lateral deviation of  2nd and 3rd toe left.   ASSESSMENT: Trauma to nail bed and discolerrating nail left great toe. Mild onychomycosis both great toes. Severe digital deformity 2nd right with risk of skin break down. Digital deformity, post surgical 2nd and 3rd left. Diabetic neuropathy. GI complication with Lamisil.  PLAN: Reviewed findings and available treatment options. May D/C Lamisil to prevent further GI complication. Recommended topical antifungal treatment. and periodic nail debridement.  Reviewed the benefit of soft tissue procedure, Tenotomy of 2nd toe right to prevent skin breakdown.  Patient will return for possible office procedure to reduce digital deformity 2nd toe right foot.

## 2017-07-08 NOTE — Patient Instructions (Signed)
Seen for nail problem left great toe and digital deformity. Noted of possible diabetic neuropathy lower limbs. Severe contracted 2nd digit right. Brown discolored nail at proximal 1/4 from old trauma left great toe. Reviewed findings and available treatment options. May use topical antifungal medication and discontinue Lamisil since developing G-I trouble.  Discussed possible benefit of Tenotomy of 2nd toe right. Patient will return for office surgery.

## 2017-07-30 ENCOUNTER — Ambulatory Visit: Payer: Managed Care, Other (non HMO) | Admitting: Podiatry

## 2017-07-30 ENCOUNTER — Encounter: Payer: Self-pay | Admitting: Podiatry

## 2017-07-30 VITALS — BP 131/82 | HR 74

## 2017-07-30 DIAGNOSIS — E0842 Diabetes mellitus due to underlying condition with diabetic polyneuropathy: Secondary | ICD-10-CM

## 2017-07-30 DIAGNOSIS — M2041 Other hammer toe(s) (acquired), right foot: Secondary | ICD-10-CM | POA: Diagnosis not present

## 2017-07-30 NOTE — Progress Notes (Signed)
Patient presents for scheduled office surgery 2nd toe right. Dx. Severe hammer toe deformity 2nd toe right with normal vascular status. All pedal pulses were palpable. Procedure done: Flexor and extensor tenotomy 2nd toe right. Local used: 5 ml of 50/50 mixture 0.5% Marcaine with epinephrine. Procedure done as follow: Right foot was prepped with Iodine and sterile technique utilized. Extensor tendon was transected at subcutaneous level after making a stab incision at the dorsal surface near the base of proximal phalanx 2nd toe. Dorsal capsul was also release. The digit was found to be still stiff from plantar contracture. Another plantar stab incision placed at the level of proximal inter phalangeal joint level. Flexor tenotomy was also done with #15 blade. The digit was found to be more relaxed with osseous deformity still remaining at the PIPJ. Surgical site flushed well with sterile saline and closed with 5/0 Nylon suture. Betadine soaked gauzed dressing applied with dry sterile dressing. Home care instruction to keep the dressing intact and dry till the next appointment. May take Advil as needed. Return in one week.

## 2017-07-30 NOTE — Patient Instructions (Signed)
Office procedure done 2nd toe right. Flexor and Extensor tenotomy on 2nd toe under local anesthesia done. Tolerated well. Keep the bandage dry and may take Advil as needed. Return in one week.

## 2017-08-06 ENCOUNTER — Ambulatory Visit (INDEPENDENT_AMBULATORY_CARE_PROVIDER_SITE_OTHER): Payer: Managed Care, Other (non HMO) | Admitting: Podiatry

## 2017-08-06 ENCOUNTER — Encounter: Payer: Self-pay | Admitting: Podiatry

## 2017-08-06 DIAGNOSIS — Z9889 Other specified postprocedural states: Secondary | ICD-10-CM

## 2017-08-06 NOTE — Progress Notes (Signed)
Had some discomfort for the first 2 days. Managed pain with Tylenol. Clean dry surgical site without redness or edema. Suture removed. Mefix tape placed and supply dispensed. Ok to get it wet. Return as needed.

## 2017-08-06 NOTE — Patient Instructions (Signed)
1 week post op wound healing normal. Suture removed. May get it wet and use tape provided. Return as needed.

## 2017-09-27 LAB — HM DIABETES EYE EXAM

## 2017-10-15 LAB — HM COLONOSCOPY

## 2017-10-22 ENCOUNTER — Ambulatory Visit: Payer: Managed Care, Other (non HMO) | Admitting: Physician Assistant

## 2017-10-22 ENCOUNTER — Encounter: Payer: Self-pay | Admitting: Physician Assistant

## 2017-10-22 VITALS — BP 129/65 | HR 70 | Ht 67.0 in | Wt 204.0 lb

## 2017-10-22 DIAGNOSIS — B351 Tinea unguium: Secondary | ICD-10-CM | POA: Diagnosis not present

## 2017-10-22 DIAGNOSIS — J0140 Acute pansinusitis, unspecified: Secondary | ICD-10-CM

## 2017-10-22 DIAGNOSIS — J209 Acute bronchitis, unspecified: Secondary | ICD-10-CM | POA: Diagnosis not present

## 2017-10-22 MED ORDER — FLUTICASONE PROPIONATE 50 MCG/ACT NA SUSP: 2.0000 | Freq: Every day | NASAL | 2 refills | Status: DC

## 2017-10-22 MED ORDER — PREDNISONE 20 MG PO TABS: ORAL_TABLET | ORAL | 0 refills | Status: DC

## 2017-10-22 MED ORDER — HYDROCODONE-HOMATROPINE 5-1.5 MG/5ML PO SYRP: 5.0000 mL | ORAL_SOLUTION | Freq: Every evening | ORAL | 0 refills | Status: DC | PRN

## 2017-10-22 NOTE — Patient Instructions (Signed)
Sinusitis, Adult Sinusitis is soreness and inflammation of your sinuses. Sinuses are hollow spaces in the bones around your face. They are located:  Around your eyes.  In the middle of your forehead.  Behind your nose.  In your cheekbones.  Your sinuses and nasal passages are lined with a stringy fluid (mucus). Mucus normally drains out of your sinuses. When your nasal tissues get inflamed or swollen, the mucus can get trapped or blocked so air cannot flow through your sinuses. This lets bacteria, viruses, and funguses grow, and that leads to infection. Follow these instructions at home: Medicines  Take, use, or apply over-the-counter and prescription medicines only as told by your doctor. These may include nasal sprays.  If you were prescribed an antibiotic medicine, take it as told by your doctor. Do not stop taking the antibiotic even if you start to feel better. Hydrate and Humidify  Drink enough water to keep your pee (urine) clear or pale yellow.  Use a cool mist humidifier to keep the humidity level in your home above 50%.  Breathe in steam for 10-15 minutes, 3-4 times a day or as told by your doctor. You can do this in the bathroom while a hot shower is running.  Try not to spend time in cool or dry air. Rest  Rest as much as possible.  Sleep with your head raised (elevated).  Make sure to get enough sleep each night. General instructions  Put a warm, moist washcloth on your face 3-4 times a day or as told by your doctor. This will help with discomfort.  Wash your hands often with soap and water. If there is no soap and water, use hand sanitizer.  Do not smoke. Avoid being around people who are smoking (secondhand smoke).  Keep all follow-up visits as told by your doctor. This is important. Contact a doctor if:  You have a fever.  Your symptoms get worse.  Your symptoms do not get better within 10 days. Get help right away if:  You have a very bad  headache.  You cannot stop throwing up (vomiting).  You have pain or swelling around your face or eyes.  You have trouble seeing.  You feel confused.  Your neck is stiff.  You have trouble breathing. This information is not intended to replace advice given to you by your health care provider. Make sure you discuss any questions you have with your health care provider. Document Released: 02/13/2008 Document Revised: 04/22/2016 Document Reviewed: 06/22/2015 Elsevier Interactive Patient Education  2018 Else[employer] Inc[employer] Acute Bronchitis, Adult Acute bronchitis is when air tubes (bronchi) in the lungs suddenly get swollen. The condition can make it hard to breathe. It can also cause these symptoms:  A cough.  Coughing up clear, yellow, or green mucus.  Wheezing.  Chest congestion.  Shortness of breath.  A fever.  Body aches.  Chills.  A sore throat.  Follow these instructions at home: Medicines  Take over-the-counter and prescription medicines only as told by your doctor.  If you were prescribed an antibiotic medicine, take it as told by your doctor. Do not stop taking the antibiotic even if you start to feel better. General instructions  Rest.  Drink enough fluids to keep your pee (urine) clear or pale yellow.  Avoid smoking and secondhand smoke. If you smoke and you need help quitting, ask your doctor. Quitting will help your lungs heal faster.  Use an inhaler, cool mist vaporizer, or humidifier as told by your   doctor.  Keep all follow-up visits as told by your doctor. This is important. How is this prevented? To lower your risk of getting this condition again:  Wash your hands often with soap and water. If you cannot use soap and water, use hand sanitizer.  Avoid contact with people who have cold symptoms.  Try not to touch your hands to your mouth, nose, or eyes.  Make sure to get the flu shot every year.  Contact a doctor if:  Your symptoms do not get  better in 2 weeks. Get help right away if:  You cough up blood.  You have chest pain.  You have very bad shortness of breath.  You become dehydrated.  You faint (pass out) or keep feeling like you are going to pass out.  You keep throwing up (vomiting).  You have a very bad headache.  Your fever or chills gets worse. This information is not intended to replace advice given to you by your health care provider. Make sure you discuss any questions you have with your health care provider. Document Released: 02/13/2008 Document Revised: 04/04/2016 Document Reviewed: 02/15/2016 Elsevier Interactive Patient Education  2018 Elsevier Inc.  

## 2017-10-22 NOTE — Progress Notes (Signed)
Subjective:    Patient ID: Teresa Spence, female    DOB: 26-Jul-1951, 67 y.o.   MRN: [id number]  HPI  Pt is 67 yo female who presents to the clinic with persistent cough that is productive. 2 weeks ago she was treated in [place] at [facility] with zpak and albuterol for URI. She finished zpak Monday. She is concerned about cough. She denies any fever, ear pain, ST. She does have a lot of sinus pressure and congestion. She states "her head feels full". She continues to take mucinex DM. No wheezing or SOB. Cough is keeping her up at night.   She did go to podiatry for toenail fungus and hammer toe. She did have surgery on hammer toe. She was not able to tolerate lamsil due to GI symptoms. She is using penlac right now. She would like for me to look at this today. Her toenail did fall off a few weeks ago.   .. Active Ambulatory Problems    Diagnosis Date Noted  . Hypertension 12/27/2016  . Type 2 diabetes mellitus without complication, without long-term current use of insulin ([place]) 12/27/2016  . Palpitations 12/27/2016  . Cellulitis of toe of left foot 12/29/2016  . 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 Absent pedal pulses 12/29/2016  . Choking 05/08/2017  . Numbness and tingling in both hands 05/08/2017  . Primary osteoarthritis involving multiple joints 05/08/2017   Resolved Ambulatory Problems    Diagnosis Date Noted  . No Resolved Ambulatory Problems   Past Medical History:  Diagnosis Date  . Diabetes mellitus without complication ([place])   . Hypertension          Review of Systems  All other systems reviewed and are negative.      Objective:   Physical Exam  Constitutional: She appears well-developed and well-nourished.  HENT:  Head: Normocephalic and atraumatic.  Right Ear: External ear normal.  Left Ear: External ear normal.  Mouth/Throat: Oropharynx is clear and moist. No oropharyngeal exudate.  TM"s clear bilaterally.  Bilateral nasal turbinates red and swollen.  No tonsil swelling or  exudate.  Tenderness over maxillary, frontal and nasal bridge.   Eyes: Conjunctivae are normal. Right eye exhibits no discharge. Left eye exhibits no discharge.  Neck: Normal range of motion. Neck supple.  Cardiovascular: Normal rate, regular rhythm and normal heart sounds.  Pulmonary/Chest: Effort normal and breath sounds normal. She has no wheezes.  Lymphadenopathy:    She has no cervical adenopathy.  Skin:  Left great toenail: 1/2 distal portion thick yellow and flaky.  1/2 proximal nail healthy appearance.           Assessment & Plan:  [name] KitchenMarland KitchenDiagnoses and all orders for this visit:  Acute bronchitis, unspecified organism -     fluticasone (FLONASE) 50 MCG/ACT nasal spray; Place 2 sprays into both nostrils daily. -     predniSONE (DELTASONE) 20 MG tablet; Take 3 tablets for 3 days, take 2 tablets for 3 days, take 1 tablet for 3 days, take 1/2 tablet for 4 days. -     HYDROcodone-homatropine (HYCODAN) 5-1.5 MG/5ML syrup; Take 5 mLs by mouth at bedtime as needed.  Acute non-recurrent pansinusitis -     fluticasone (FLONASE) 50 MCG/ACT nasal spray; Place 2 sprays into both nostrils daily. -     predniSONE (DELTASONE) 20 MG tablet; Take 3 tablets for 3 days, take 2 tablets for 3 days, take 1 tablet for 3 days, take 1/2 tablet for 4 days.   Pt just finished zpak.  Pt seems stable on PE today. Vitals great. Will add prednisone, flonase, and cough syrup. Continue albuterol if needed for cough. Continue mucinex D as needed. Consider humidifer. Rest and hydrate. Suck on some cough drops.  [place] controlled substance database reviewed with no concerns.   Reassured I did see new healthy toenail growth. This is great. I do see some nail with fungus as well but only at the tip of left great toe. Continue to use penlac. Follow up as needed.   [name] KitchenSpent 30 minutes with patient and greater than 50 percent of visit spent counseling patient regarding treatment plan.

## 2017-10-25 ENCOUNTER — Telehealth: Payer: Self-pay | Admitting: *Deleted

## 2017-10-25 ENCOUNTER — Encounter: Payer: Self-pay | Admitting: Physician Assistant

## 2017-10-25 DIAGNOSIS — B351 Tinea unguium: Secondary | ICD-10-CM | POA: Insufficient documentation

## 2017-10-25 NOTE — Telephone Encounter (Signed)
Call pt: since she has already had a zpack and the congestion is breaking up I think she is getting better. unfortunatley the cough can linger much longer.  We can all in some prednisone if she would like if she is noticing any shortness of breath or wheezing.  If not some  better by Monday then may need a chest xray.

## 2017-10-25 NOTE — Telephone Encounter (Signed)
Pt notified of recommendations.  She has Prednisone & Hycodan currently so I advised her to continue on those and to call Monday if no better.

## 2017-10-25 NOTE — Telephone Encounter (Addendum)
Pt saw [name] earlier this week for cough.  She left vm stating that she still has the cough.  She stated that her congestion is starting to break up some and the cough syrup is helping her sleep at night but she just can't get rid of it.  Please advise.

## 2017-11-05 ENCOUNTER — Encounter: Payer: Self-pay | Admitting: Physician Assistant

## 2017-11-05 ENCOUNTER — Ambulatory Visit: Payer: Managed Care, Other (non HMO) | Admitting: Physician Assistant

## 2017-11-05 VITALS — BP 128/72 | HR 73 | Ht 67.0 in | Wt 210.0 lb

## 2017-11-05 DIAGNOSIS — R002 Palpitations: Secondary | ICD-10-CM

## 2017-11-05 DIAGNOSIS — E6609 Other obesity due to excess calories: Secondary | ICD-10-CM | POA: Diagnosis not present

## 2017-11-05 DIAGNOSIS — E782 Mixed hyperlipidemia: Secondary | ICD-10-CM

## 2017-11-05 DIAGNOSIS — Z23 Encounter for immunization: Secondary | ICD-10-CM

## 2017-11-05 DIAGNOSIS — I10 Essential (primary) hypertension: Secondary | ICD-10-CM

## 2017-11-05 DIAGNOSIS — Z6830 Body mass index (BMI) 30.0-30.9, adult: Secondary | ICD-10-CM | POA: Insufficient documentation

## 2017-11-05 DIAGNOSIS — Z6832 Body mass index (BMI) 32.0-32.9, adult: Secondary | ICD-10-CM | POA: Diagnosis not present

## 2017-11-05 DIAGNOSIS — E66811 Obesity, class 1: Secondary | ICD-10-CM | POA: Insufficient documentation

## 2017-11-05 DIAGNOSIS — E119 Type 2 diabetes mellitus without complications: Secondary | ICD-10-CM

## 2017-11-05 LAB — POCT GLYCOSYLATED HEMOGLOBIN (HGB A1C): Hemoglobin A1C: 7.6

## 2017-11-05 MED ORDER — DAPAGLIFLOZIN PROPANEDIOL 10 MG PO TABS: 10.0000 mg | ORAL_TABLET | Freq: Every day | ORAL | 2 refills | Status: DC

## 2017-11-05 MED ORDER — ATORVASTATIN CALCIUM 40 MG PO TABS: 40.0000 mg | ORAL_TABLET | Freq: Every day | ORAL | 1 refills | Status: DC

## 2017-11-05 MED ORDER — METFORMIN HCL 1000 MG PO TABS: 1000.0000 mg | ORAL_TABLET | Freq: Two times a day (BID) | ORAL | 1 refills | Status: DC

## 2017-11-05 MED ORDER — LOSARTAN POTASSIUM-HCTZ 100-25 MG PO TABS: 1.0000 | ORAL_TABLET | Freq: Every day | ORAL | 1 refills | Status: DC

## 2017-11-05 NOTE — Progress Notes (Signed)
Subjective:    Patient ID: Teresa Spence, female    DOB: 1951-04-16, 67 y.o.   MRN: [id number]  HPI  Pt is a 67 yo obese female with type II diabetes, HTN, palpitations who presents to the clinic for 3 month DM follow up.   DM- not checking sugars. Only on metformin. Denies any hypoglycemia. She admits she has not been dieting or exericising. She starts a weight loss study at [facility] 11th of march and will be doing a very strict diet and exercise program. Eye exam in December 2018.  She would like to go off of bisoprolol. She has been on for palpitations. She has not had any and trying to limit medications. She was told her palpitations were coming from caffeine consumption. She has really cut back on caffiene since then.   .. Active Ambulatory Problems    Diagnosis Date Noted  . Hypertension 12/27/2016  . Type 2 diabetes mellitus without complication, without long-term current use of insulin ([place]) 12/27/2016  . Palpitations 12/27/2016  . Cellulitis of toe of left foot 12/29/2016  . 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 Absent pedal pulses 12/29/2016  . Choking 05/08/2017  . Numbness and tingling in both hands 05/08/2017  . Primary osteoarthritis involving multiple joints 05/08/2017  . Toenail fungus 10/25/2017  . Class 1 obesity due to excess calories with serious comorbidity and body mass index (BMI) of 32.0 to 32.9 in adult 11/05/2017   Resolved Ambulatory Problems    Diagnosis Date Noted  . No Resolved Ambulatory Problems   Past Medical History:  Diagnosis Date  . Diabetes mellitus without complication ([place])   . Hypertension     Review of Systems  All other systems reviewed and are negative.      Objective:   Physical Exam  Constitutional: She is oriented to person, place, and time. She appears well-developed and well-nourished.  HENT:  Head: Normocephalic and atraumatic.  Neck: Normal range of motion. Neck supple.  Cardiovascular: Normal rate, regular rhythm and normal  heart sounds.  Pulmonary/Chest: Effort normal and breath sounds normal. She has no wheezes.  Lymphadenopathy:    She has no cervical adenopathy.  Neurological: She is alert and oriented to person, place, and time.  Skin:  See foot exam.   Psychiatric: She has a normal mood and affect. Her behavior is normal.          Assessment & Plan:  [name] KitchenMarland KitchenNadeen was seen today for medication follow up.  Diagnoses and all orders for this visit:  Type 2 diabetes mellitus without complication, without long-term current use of insulin (HCC) -     POCT HgB A1C -     dapagliflozin propanediol (FARXIGA) 10 MG TABS tablet; Take 10 mg by mouth daily. -     metFORMIN (GLUCOPHAGE) 1000 MG tablet; Take 1 tablet (1,000 mg total) by mouth 2 (two) times daily with a meal.  Essential hypertension -     losartan-hydrochlorothiazide (HYZAAR) 100-25 MG tablet; Take 1 tablet by mouth daily.  Need for pneumococcal vaccination -     Pneumococcal polysaccharide vaccine 23-valent greater than or equal to 2yo subcutaneous/IM  Class 1 obesity due to excess calories with serious comorbidity and body mass index (BMI) of 32.0 to 32.9 in adult  Palpitations  Mixed hyperlipidemia -     atorvastatin (LIPITOR) 40 MG tablet; Take 1 tablet (40 mg total) by mouth daily.  .. Results for orders placed or performed in visit on 11/05/17  POCT HgB A1C  Result Value Ref Range   Hemoglobin A1C 7.6    A!C has increased to 7.6.  Added farxiga to metformin. Discussed side effects. Checked kidney function and GFR good.  .. Diabetic Foot Exam - Simple   Simple Foot Form Visual Inspection No deformities, no ulcerations, no other skin breakdown bilaterally:  Yes Sensation Testing See comments:  Yes Pulse Check Posterior Tibialis and Dorsalis pulse intact bilaterally:  Yes Comments No sensation to touch bilateral feet. She did have sensation to vibration in the great toe.  Capillary refill 2 sec.     Will call to get eye exam  from my eye doctor in [place].  On ARB.  On STATIN.  On ASA.  pneumococcal 23 given today.   BP on 2nd recheck great. Refilled medication as needed.   Will cut beta blocker in half for 4 weeks then stop. If palpations resume we may go back on BB.    Patient is due for her mammogram, bone density, colonoscopy.  She declines these today.  I strongly urged her to make appointment for Medicare wellness once entering the program.  We can discuss all of her screenings at that time.

## 2018-05-06 ENCOUNTER — Ambulatory Visit (INDEPENDENT_AMBULATORY_CARE_PROVIDER_SITE_OTHER): Payer: Managed Care, Other (non HMO) | Admitting: Physician Assistant

## 2018-05-06 ENCOUNTER — Encounter: Payer: Self-pay | Admitting: Physician Assistant

## 2018-05-06 ENCOUNTER — Telehealth: Payer: Self-pay | Admitting: Physician Assistant

## 2018-05-06 VITALS — BP 144/65 | HR 71 | Ht 67.0 in | Wt 188.0 lb

## 2018-05-06 DIAGNOSIS — D2321 Other benign neoplasm of skin of right ear and external auricular canal: Secondary | ICD-10-CM

## 2018-05-06 DIAGNOSIS — L821 Other seborrheic keratosis: Secondary | ICD-10-CM | POA: Insufficient documentation

## 2018-05-06 DIAGNOSIS — Z1231 Encounter for screening mammogram for malignant neoplasm of breast: Secondary | ICD-10-CM

## 2018-05-06 DIAGNOSIS — E1169 Type 2 diabetes mellitus with other specified complication: Secondary | ICD-10-CM | POA: Diagnosis not present

## 2018-05-06 DIAGNOSIS — Z79899 Other long term (current) drug therapy: Secondary | ICD-10-CM

## 2018-05-06 DIAGNOSIS — E785 Hyperlipidemia, unspecified: Secondary | ICD-10-CM

## 2018-05-06 DIAGNOSIS — Z1211 Encounter for screening for malignant neoplasm of colon: Secondary | ICD-10-CM

## 2018-05-06 DIAGNOSIS — Z131 Encounter for screening for diabetes mellitus: Secondary | ICD-10-CM

## 2018-05-06 DIAGNOSIS — R0789 Other chest pain: Secondary | ICD-10-CM | POA: Insufficient documentation

## 2018-05-06 NOTE — Telephone Encounter (Signed)
My eye doctor in [place] last eye exam for DM.

## 2018-05-06 NOTE — Progress Notes (Signed)
Subjective:    Patient ID: Teresa Spence, female    DOB: 07-13-1951, 67 y.o.   MRN: [id number]  HPI  Pt is a 67 yo female with T2DM, HTN, HLD who presents to the clinic for follow up and to get some referrals.   She is actively working on weight loss. She has lost 22lbs in a [employer] study. She is only using exercise and diet changes. She is only on metformin for DM. Denies any hypoglycemia. Not checking sugars.   She has stopped farxiga and lipitor. She did not like the potential side effects.   She has some skin concerns. She has dark spots on right upper leg in inguinal area as well as around bra line. She also has an ear cyst of right ear lobe. Noticed it when when to put her 2nd piercing in.   She is having some chest tightness but most often in the am and relieved by burping. tums also help. She is taking tylenol pm every night to sleep.   .. Active Ambulatory Problems    Diagnosis Date Noted  . Hypertension 12/27/2016  . Type 2 diabetes mellitus without complication, without long-term current use of insulin ([place]) 12/27/2016  . Palpitations 12/27/2016  . Cellulitis of toe of left foot 12/29/2016  . 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 Absent pedal pulses 12/29/2016  . Choking 05/08/2017  . Numbness and tingling in both hands 05/08/2017  . Primary osteoarthritis involving multiple joints 05/08/2017  . Toenail fungus 10/25/2017  . Class 1 obesity due to excess calories with serious comorbidity and body mass index (BMI) of 32.0 to 32.9 in adult 11/05/2017  . Hyperlipidemia associated with type 2 diabetes mellitus ([place]) 05/06/2018  . Seborrheic keratoses 05/06/2018  . Dermoid cyst of right ear 05/06/2018  . Chest tightness 05/06/2018   Resolved Ambulatory Problems    Diagnosis Date Noted  . No Resolved Ambulatory Problems   Past Medical History:  Diagnosis Date  . Diabetes mellitus without complication ([place])       Review of Systems  All other systems reviewed and are  negative.      Objective:   Physical Exam  Constitutional: She is oriented to person, place, and time. She appears well-developed and well-nourished.  HENT:  Head: Normocephalic and atraumatic.  Cardiovascular: Normal rate and regular rhythm.  Pulmonary/Chest: Effort normal and breath sounds normal.  Neurological: She is alert and oriented to person, place, and time.  Skin:  Right ear lobe 1cm by 1cm firm cyst posteriorly.   Multiple sebhorreic keratosis but right inguinal area have very hyperpigmented asymmetric presentation.   Psychiatric: She has a normal mood and affect. Her behavior is normal.          Assessment & Plan:  [name] KitchenMarland KitchenDiagnoses and all orders for this visit:  Hyperlipidemia associated with type 2 diabetes mellitus ([place]) -     Hemoglobin A1c -     Lipid Panel w/reflex Direct LDL -     COMPLETE METABOLIC PANEL WITH GFR  Screening for diabetes mellitus  Medication management -     Lipid Panel w/reflex Direct LDL -     COMPLETE METABOLIC PANEL WITH GFR  Visit for screening mammogram -     MM 3D SCREEN BREAST BILATERAL  Colon cancer screening -     Ambulatory referral to Gastroenterology  Seborrheic keratoses -     Ambulatory referral to Dermatology  Dermoid cyst of right ear  Chest tightness   Colon cancer and mammogram  ordered.   Derm referral made for SK's and cyst of right ear lobe. Pt aware SK's are benign but concerned somewhat about the asymmetric hyperpigmentation of the right inguinal SK's.   Labs ordered to evaluate DM and HLD. Discussed risk of not controlling the 2. Continue with good diet and exercise changes.   Chest tightness relieved by burping sounds GI related. Could be having tylenol PM stuck in esophagus. Continue to monitor. If first thing in am consider zantac before bed. Needs baseline EKG. Pt will follow up in near future.

## 2018-05-07 LAB — HEMOGLOBIN A1C
EAG (MMOL/L): 7.3 (calc)
Hgb A1c MFr Bld: 6.2 % of total Hgb — ABNORMAL HIGH (ref <5.7)
Mean Plasma Glucose: 131 (calc)

## 2018-05-07 LAB — COMPLETE METABOLIC PANEL WITH GFR
AG RATIO: 1.8 (calc) (ref 1.0–2.5)
ALBUMIN MSPROF: 4.6 g/dL (ref 3.6–5.1)
ALKALINE PHOSPHATASE (APISO): 52 U/L (ref 33–130)
ALT: 12 U/L (ref 6–29)
AST: 16 U/L (ref 10–35)
BILIRUBIN TOTAL: 0.5 mg/dL (ref 0.2–1.2)
BUN / CREAT RATIO: 34 (calc) — AB (ref 6–22)
BUN: 27 mg/dL — ABNORMAL HIGH (ref 7–25)
CHLORIDE: 102 mmol/L (ref 98–110)
CO2: 27 mmol/L (ref 20–32)
Calcium: 9.9 mg/dL (ref 8.6–10.4)
Creat: 0.79 mg/dL (ref 0.50–0.99)
GFR, EST AFRICAN AMERICAN: 89 mL/min/{1.73_m2} (ref >=60)
GFR, Est Non African American: 77 mL/min/{1.73_m2} (ref >=60)
GLOBULIN: 2.5 g/dL (ref 1.9–3.7)
Glucose, Bld: 122 mg/dL — ABNORMAL HIGH (ref 65–99)
Potassium: 4.3 mmol/L (ref 3.5–5.3)
SODIUM: 139 mmol/L (ref 135–146)
TOTAL PROTEIN: 7.1 g/dL (ref 6.1–8.1)

## 2018-05-07 LAB — LIPID PANEL W/REFLEX DIRECT LDL
CHOLESTEROL: 171 mg/dL (ref <200)
HDL: 35 mg/dL — AB (ref >50)
LDL CHOLESTEROL (CALC): 99 mg/dL
Non-HDL Cholesterol (Calc): 136 mg/dL (calc) — ABNORMAL HIGH (ref <130)
Total CHOL/HDL Ratio: 4.9 (calc) (ref <5.0)
Triglycerides: 262 mg/dL — ABNORMAL HIGH (ref <150)

## 2018-05-07 NOTE — Progress Notes (Signed)
Call pt: weight loss and diet changes have done GREAT for you. A!C down from 7.6 to 6.2. Keep it up. I would stay on metformin.  LDL did improve to 99. Goal is under 70 for a diabetic but you are moving in the right direction. We can talk about what guidelines say about statin and diabetes at next visit.  TG still elevated some. I would start fish oil 4000mg  a day.

## 2018-05-08 ENCOUNTER — Ambulatory Visit (INDEPENDENT_AMBULATORY_CARE_PROVIDER_SITE_OTHER): Payer: Medicare Other

## 2018-05-08 DIAGNOSIS — Z1231 Encounter for screening mammogram for malignant neoplasm of breast: Secondary | ICD-10-CM | POA: Diagnosis not present

## 2018-05-13 NOTE — Telephone Encounter (Signed)
Done

## 2018-05-15 DIAGNOSIS — D123 Benign neoplasm of transverse colon: Secondary | ICD-10-CM | POA: Diagnosis not present

## 2018-05-15 DIAGNOSIS — Z86010 Personal history of colonic polyps: Secondary | ICD-10-CM | POA: Diagnosis not present

## 2018-05-15 DIAGNOSIS — D120 Benign neoplasm of cecum: Secondary | ICD-10-CM | POA: Diagnosis not present

## 2018-05-19 ENCOUNTER — Ambulatory Visit: Payer: Self-pay | Admitting: Physician Assistant

## 2018-05-19 NOTE — Progress Notes (Signed)
Call pt: normal mammogram. Follow up in 1 year.

## 2018-05-20 ENCOUNTER — Ambulatory Visit: Payer: Medicare Other | Admitting: Physician Assistant

## 2018-05-21 ENCOUNTER — Ambulatory Visit (INDEPENDENT_AMBULATORY_CARE_PROVIDER_SITE_OTHER): Payer: Medicare Other | Admitting: Physician Assistant

## 2018-05-21 ENCOUNTER — Encounter: Payer: Self-pay | Admitting: Physician Assistant

## 2018-05-21 VITALS — BP 106/57 | HR 78 | Temp 98.4°F | Ht 67.0 in | Wt 189.0 lb

## 2018-05-21 DIAGNOSIS — E119 Type 2 diabetes mellitus without complications: Secondary | ICD-10-CM | POA: Diagnosis not present

## 2018-05-21 DIAGNOSIS — E785 Hyperlipidemia, unspecified: Secondary | ICD-10-CM | POA: Diagnosis not present

## 2018-05-21 DIAGNOSIS — K635 Polyp of colon: Secondary | ICD-10-CM

## 2018-05-21 DIAGNOSIS — E1169 Type 2 diabetes mellitus with other specified complication: Secondary | ICD-10-CM | POA: Diagnosis not present

## 2018-05-21 DIAGNOSIS — R031 Nonspecific low blood-pressure reading: Secondary | ICD-10-CM

## 2018-05-21 DIAGNOSIS — I10 Essential (primary) hypertension: Secondary | ICD-10-CM | POA: Diagnosis not present

## 2018-05-21 MED ORDER — LOSARTAN POTASSIUM-HCTZ 100-25 MG PO TABS: 1.0000 | ORAL_TABLET | Freq: Every day | ORAL | 1 refills | Status: DC

## 2018-05-21 MED ORDER — ATORVASTATIN CALCIUM 40 MG PO TABS: 40.0000 mg | ORAL_TABLET | Freq: Every day | ORAL | 1 refills | Status: DC

## 2018-05-21 MED ORDER — METFORMIN HCL 1000 MG PO TABS: 1000.0000 mg | ORAL_TABLET | Freq: Two times a day (BID) | ORAL | 1 refills | Status: DC

## 2018-05-21 NOTE — Patient Instructions (Signed)
Take one half tablet of hyzaar. Keep monitoring BP at home. Goal under 130/90.

## 2018-05-22 ENCOUNTER — Encounter: Payer: Self-pay | Admitting: Physician Assistant

## 2018-05-22 NOTE — Progress Notes (Signed)
Subjective:    Patient ID: Teresa Spence, female    DOB: 1950-12-29, 67 y.o.   MRN: [id number]  HPI Pt is a 67 yo female who presents to the clinic to go over labs and follow up.   She has no complaints today.   .. Active Ambulatory Problems    Diagnosis Date Noted  . Hypertension 12/27/2016  . Type 2 diabetes mellitus without complication, without long-term current use of insulin ([place]) 12/27/2016  . Palpitations 12/27/2016  . Cellulitis of toe of left foot 12/29/2016  . 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 Absent pedal pulses 12/29/2016  . Choking 05/08/2017  . Numbness and tingling in both hands 05/08/2017  . Primary osteoarthritis involving multiple joints 05/08/2017  . Toenail fungus 10/25/2017  . Hyperlipidemia associated with type 2 diabetes mellitus ([place]) 05/06/2018  . Seborrheic keratoses 05/06/2018  . Dermoid cyst of right ear 05/06/2018  . Chest tightness 05/06/2018  . Colon polyps 05/21/2018   Resolved Ambulatory Problems    Diagnosis Date Noted  . Class 1 obesity due to excess calories with serious comorbidity and body mass index (BMI) of 32.0 to 32.9 in adult 11/05/2017   Past Medical History:  Diagnosis Date  . Diabetes mellitus without complication ([place])       Review of Systems  All other systems reviewed and are negative.      Objective:   Physical Exam  Constitutional: She is oriented to person, place, and time. She appears well-developed and well-nourished.  HENT:  Head: Normocephalic and atraumatic.  Cardiovascular: Normal rate and regular rhythm.  Pulmonary/Chest: Effort normal and breath sounds normal.  Neurological: She is alert and oriented to person, place, and time.  Psychiatric: She has a normal mood and affect. Her behavior is normal.          Assessment & Plan:  [name] KitchenMarland KitchenChavie was seen today for follow-up.  Diagnoses and all orders for this visit:  Hyperlipidemia associated with type 2 diabetes mellitus ([place]) -     atorvastatin (LIPITOR) 40 MG  tablet; Take 1 tablet (40 mg total) by mouth daily.  Polyp of colon, unspecified part of colon, unspecified type  Type 2 diabetes mellitus without complication, without long-term current use of insulin (HCC) -     metFORMIN (GLUCOPHAGE) 1000 MG tablet; Take 1 tablet (1,000 mg total) by mouth 2 (two) times daily with a meal.  Hypertension, unspecified type  Essential hypertension -     losartan-hydrochlorothiazide (HYZAAR) 100-25 MG tablet; Take 1 tablet by mouth daily.  Low blood pressure reading   .[name] Depression screen [facility] 2/9 05/21/2018 05/06/2018 06/10/2017 05/06/2017 12/27/2016  Decreased Interest 0 0 0 0 0  Down, Depressed, Hopeless 0 0 0 0 0  PHQ - 2 Score 0 0 0 0 0  Altered sleeping 0 0 - - -  Tired, decreased energy 0 0 - - -  Change in appetite 0 0 - - -  Feeling bad or failure about yourself  0 0 - - -  Trouble concentrating 0 0 - - -  Moving slowly or fidgety/restless 0 0 - - -  Suicidal thoughts 0 0 - - -  PHQ-9 Score 0 0 - - -   Reviewed labs. A!C keeps coming down. LDL has improved but not to goal of under 70. But patient has not been taking lipitor regularly. She agrees to start taking it daily. She will start fish oil for elevated TG. Continue metformin.  Pt aware she needs eye exam for DM.  Continue to work on weight loss with diet and exercise.   Pt just had colonoscopy. Awaiting official report. She did have polyps uncertain pathology.   BP low today. Complains of some dizziness. Will cut hyzaar in half. Continue to monitor at home. Follow up in 3 months.

## 2018-06-13 ENCOUNTER — Encounter: Payer: Self-pay | Admitting: Physician Assistant

## 2018-06-19 DIAGNOSIS — Z23 Encounter for immunization: Secondary | ICD-10-CM | POA: Diagnosis not present

## 2018-06-23 DIAGNOSIS — H40022 Open angle with borderline findings, high risk, left eye: Secondary | ICD-10-CM | POA: Diagnosis not present

## 2018-06-23 DIAGNOSIS — E119 Type 2 diabetes mellitus without complications: Secondary | ICD-10-CM | POA: Diagnosis not present

## 2018-06-23 DIAGNOSIS — H2513 Age-related nuclear cataract, bilateral: Secondary | ICD-10-CM | POA: Diagnosis not present

## 2018-06-23 DIAGNOSIS — H5213 Myopia, bilateral: Secondary | ICD-10-CM | POA: Diagnosis not present

## 2018-06-23 DIAGNOSIS — H524 Presbyopia: Secondary | ICD-10-CM | POA: Diagnosis not present

## 2018-06-23 LAB — HM DIABETES EYE EXAM

## 2018-07-03 ENCOUNTER — Encounter: Payer: Self-pay | Admitting: Physician Assistant

## 2018-08-12 DIAGNOSIS — H40022 Open angle with borderline findings, high risk, left eye: Secondary | ICD-10-CM | POA: Diagnosis not present

## 2018-08-20 ENCOUNTER — Ambulatory Visit: Payer: Medicare Other | Admitting: Physician Assistant

## 2018-08-27 ENCOUNTER — Ambulatory Visit: Payer: Medicare Other | Admitting: Physician Assistant

## 2018-09-09 ENCOUNTER — Ambulatory Visit (INDEPENDENT_AMBULATORY_CARE_PROVIDER_SITE_OTHER): Payer: Medicare Other | Admitting: Physician Assistant

## 2018-09-09 ENCOUNTER — Encounter: Payer: Self-pay | Admitting: Physician Assistant

## 2018-09-09 VITALS — BP 152/69 | HR 71 | Ht 67.0 in | Wt 188.2 lb

## 2018-09-09 DIAGNOSIS — J069 Acute upper respiratory infection, unspecified: Secondary | ICD-10-CM

## 2018-09-09 DIAGNOSIS — M159 Polyosteoarthritis, unspecified: Secondary | ICD-10-CM

## 2018-09-09 DIAGNOSIS — I10 Essential (primary) hypertension: Secondary | ICD-10-CM

## 2018-09-09 DIAGNOSIS — E119 Type 2 diabetes mellitus without complications: Secondary | ICD-10-CM | POA: Diagnosis not present

## 2018-09-09 LAB — POCT GLYCOSYLATED HEMOGLOBIN (HGB A1C): Hemoglobin A1C: 6.5 % — AB (ref 4.0–5.6)

## 2018-09-09 MED ORDER — DICLOFENAC SODIUM 1 % TD GEL: 4.0000 g | Freq: Four times a day (QID) | TRANSDERMAL | 1 refills | Status: DC

## 2018-09-09 MED ORDER — MELOXICAM 15 MG PO TABS: 15.0000 mg | ORAL_TABLET | Freq: Every day | ORAL | 5 refills | Status: DC

## 2018-09-09 MED ORDER — LOSARTAN POTASSIUM-HCTZ 100-25 MG PO TABS: 1.0000 | ORAL_TABLET | Freq: Every day | ORAL | 1 refills | Status: DC

## 2018-09-09 NOTE — Progress Notes (Signed)
Subjective:    Patient ID: Teresa Spence, female    DOB: [birth year], 67 y.o.   MRN: [id number]  HPI  Pt is a 67 yo female with T2DM, HTN who presents to the clinic for 3 month follow up.   DM- not checking sugars. Denies any hypoglycemia. No open wounds. Taking metformin daily. Enrolled in [employer] [facility] [place]. Continues to maintain weight loss but no more major weight loss.   She does mention sinus and cold symptoms for the last 2-3 weeks. She feels like she is getting over it. She is feeling pretty good today. Wanted to be looked at. Not taken any medications currently. Took some OTC medications when first started.   HTN- doing well on medication. Did not take medication this morning. No CP, palpitations, headache or vision changes.   She does mention some bilateral hand joint pain. Ibuprofen helps some but mobic helped a lot in the past. She has started to do some strengthening exercises. Some days are worse than others.   .. Active Ambulatory Problems    Diagnosis Date Noted  . Hypertension 12/27/2016  . Type 2 diabetes mellitus without complication, without long-term current use of insulin ([place]) 12/27/2016  . Palpitations 12/27/2016  . Cellulitis of toe of left foot 12/29/2016  . 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 Absent pedal pulses 12/29/2016  . Choking 05/08/2017  . Numbness and tingling in both hands 05/08/2017  . Primary osteoarthritis involving multiple joints 05/08/2017  . Toenail fungus 10/25/2017  . Hyperlipidemia associated with type 2 diabetes mellitus ([place]) 05/06/2018  . Seborrheic keratoses 05/06/2018  . Dermoid cyst of right ear 05/06/2018  . Chest tightness 05/06/2018  . Colon polyps 05/21/2018  . Generalized osteoarthritis of hand 09/11/2018   Resolved Ambulatory Problems    Diagnosis Date Noted  . Class 1 obesity due to excess calories with serious comorbidity and body mass index (BMI) of 32.0 to 32.9 in adult 11/05/2017   Past Medical History:  Diagnosis Date   . Diabetes mellitus without complication ([place])          Review of Systems See HPI.     Objective:   Physical Exam Vitals signs reviewed.  Constitutional:      Appearance: Normal appearance.  HENT:     Head: Normocephalic and atraumatic.     Right Ear: Tympanic membrane and ear canal normal.     Left Ear: Tympanic membrane and ear canal normal.     Nose: Nose normal.     Mouth/Throat:     Mouth: Mucous membranes are moist.  Eyes:     Extraocular Movements: Extraocular movements intact.     Conjunctiva/sclera: Conjunctivae normal.     Pupils: Pupils are equal, round, and reactive to light.  Cardiovascular:     Rate and Rhythm: Normal rate and regular rhythm.  Pulmonary:     Effort: Pulmonary effort is normal.     Breath sounds: Normal breath sounds.  Neurological:     General: No focal deficit present.     Mental Status: She is alert and oriented to person, place, and time.  Psychiatric:        Mood and Affect: Mood normal.        Behavior: Behavior normal.           Assessment & Plan:  [name] KitchenMarland KitchenMinervia was seen today for hypertension and diabetes.  Diagnoses and all orders for this visit:  Type 2 diabetes mellitus without complication, without long-term current use of insulin ([place]) -  POCT HgB A1C  Essential hypertension -     losartan-hydrochlorothiazide (HYZAAR) 100-25 MG tablet; Take 1 tablet by mouth daily.  Generalized osteoarthritis of hand -     meloxicam (MOBIC) 15 MG tablet; Take 1 tablet (15 mg total) by mouth daily. -     diclofenac sodium (VOLTAREN) 1 % GEL; Apply 4 g topically 4 (four) times daily. To affected joint.  Viral upper respiratory tract infection    Lab Results  Component Value Date   HGBA1C 6.5 (A) 09/09/2018   A!C looks good.  Stay on same medications.  Continue to work on diet and weight loss.  BP not at goal today but did not take BP medication. Refilled. Encouraged patient to make log and report via mychart.  Vaccines up  to date.   Given hand exercises to help build strength and help with OA. mobic to use as needed. Given voltaren gel as needed as well. Will watch kidney function if taking regularly.   Sounds like had URI. Seems to be improving. Follow up if symptoms do not improve or worsen. Discussed post viral dry cough can last for weeks.

## 2018-09-11 ENCOUNTER — Encounter: Payer: Self-pay | Admitting: Physician Assistant

## 2018-09-11 DIAGNOSIS — M159 Polyosteoarthritis, unspecified: Secondary | ICD-10-CM | POA: Insufficient documentation

## 2018-09-16 DIAGNOSIS — L718 Other rosacea: Secondary | ICD-10-CM | POA: Diagnosis not present

## 2018-09-16 DIAGNOSIS — L720 Epidermal cyst: Secondary | ICD-10-CM | POA: Diagnosis not present

## 2018-09-16 DIAGNOSIS — L821 Other seborrheic keratosis: Secondary | ICD-10-CM | POA: Diagnosis not present

## 2019-02-24 ENCOUNTER — Other Ambulatory Visit: Payer: Self-pay | Admitting: Family Medicine

## 2019-02-24 DIAGNOSIS — E119 Type 2 diabetes mellitus without complications: Secondary | ICD-10-CM

## 2019-04-05 DIAGNOSIS — T25222A Burn of second degree of left foot, initial encounter: Secondary | ICD-10-CM | POA: Diagnosis not present

## 2019-04-05 DIAGNOSIS — T25221A Burn of second degree of right foot, initial encounter: Secondary | ICD-10-CM | POA: Diagnosis not present

## 2019-04-05 DIAGNOSIS — X19XXXA Contact with other heat and hot substances, initial encounter: Secondary | ICD-10-CM | POA: Diagnosis not present

## 2019-04-05 DIAGNOSIS — T25022A Burn of unspecified degree of left foot, initial encounter: Secondary | ICD-10-CM | POA: Diagnosis not present

## 2019-04-05 DIAGNOSIS — E119 Type 2 diabetes mellitus without complications: Secondary | ICD-10-CM | POA: Diagnosis not present

## 2019-04-06 DIAGNOSIS — T25222A Burn of second degree of left foot, initial encounter: Secondary | ICD-10-CM | POA: Diagnosis not present

## 2019-04-06 DIAGNOSIS — T25221A Burn of second degree of right foot, initial encounter: Secondary | ICD-10-CM | POA: Diagnosis not present

## 2019-04-07 ENCOUNTER — Encounter: Payer: Self-pay | Admitting: Physician Assistant

## 2019-04-08 DIAGNOSIS — S91301A Unspecified open wound, right foot, initial encounter: Secondary | ICD-10-CM | POA: Diagnosis not present

## 2019-04-08 DIAGNOSIS — S91302A Unspecified open wound, left foot, initial encounter: Secondary | ICD-10-CM | POA: Diagnosis not present

## 2019-04-10 DIAGNOSIS — S91302A Unspecified open wound, left foot, initial encounter: Secondary | ICD-10-CM | POA: Diagnosis not present

## 2019-04-22 DIAGNOSIS — M21611 Bunion of right foot: Secondary | ICD-10-CM | POA: Diagnosis not present

## 2019-04-22 DIAGNOSIS — M2041 Other hammer toe(s) (acquired), right foot: Secondary | ICD-10-CM | POA: Diagnosis not present

## 2019-04-22 DIAGNOSIS — M25571 Pain in right ankle and joints of right foot: Secondary | ICD-10-CM | POA: Diagnosis not present

## 2019-04-22 DIAGNOSIS — M25572 Pain in left ankle and joints of left foot: Secondary | ICD-10-CM | POA: Diagnosis not present

## 2019-04-22 DIAGNOSIS — M21612 Bunion of left foot: Secondary | ICD-10-CM | POA: Diagnosis not present

## 2019-04-23 DIAGNOSIS — E785 Hyperlipidemia, unspecified: Secondary | ICD-10-CM | POA: Diagnosis not present

## 2019-04-23 DIAGNOSIS — Z8249 Family history of ischemic heart disease and other diseases of the circulatory system: Secondary | ICD-10-CM | POA: Diagnosis not present

## 2019-04-23 DIAGNOSIS — G8929 Other chronic pain: Secondary | ICD-10-CM | POA: Diagnosis not present

## 2019-04-23 DIAGNOSIS — M1990 Unspecified osteoarthritis, unspecified site: Secondary | ICD-10-CM | POA: Diagnosis not present

## 2019-04-23 DIAGNOSIS — E1140 Type 2 diabetes mellitus with diabetic neuropathy, unspecified: Secondary | ICD-10-CM | POA: Diagnosis not present

## 2019-04-23 DIAGNOSIS — Z823 Family history of stroke: Secondary | ICD-10-CM | POA: Diagnosis not present

## 2019-04-23 DIAGNOSIS — K219 Gastro-esophageal reflux disease without esophagitis: Secondary | ICD-10-CM | POA: Diagnosis not present

## 2019-04-23 DIAGNOSIS — Z7984 Long term (current) use of oral hypoglycemic drugs: Secondary | ICD-10-CM | POA: Diagnosis not present

## 2019-04-23 DIAGNOSIS — I10 Essential (primary) hypertension: Secondary | ICD-10-CM | POA: Diagnosis not present

## 2019-04-23 DIAGNOSIS — E669 Obesity, unspecified: Secondary | ICD-10-CM | POA: Diagnosis not present

## 2019-05-06 DIAGNOSIS — R69 Illness, unspecified: Secondary | ICD-10-CM | POA: Diagnosis not present

## 2019-05-09 ENCOUNTER — Other Ambulatory Visit: Payer: Self-pay | Admitting: Physician Assistant

## 2019-05-09 DIAGNOSIS — E1169 Type 2 diabetes mellitus with other specified complication: Secondary | ICD-10-CM

## 2019-05-09 DIAGNOSIS — I10 Essential (primary) hypertension: Secondary | ICD-10-CM

## 2019-05-13 DIAGNOSIS — M21612 Bunion of left foot: Secondary | ICD-10-CM | POA: Diagnosis not present

## 2019-05-13 DIAGNOSIS — Z01812 Encounter for preprocedural laboratory examination: Secondary | ICD-10-CM | POA: Diagnosis not present

## 2019-05-13 DIAGNOSIS — M21611 Bunion of right foot: Secondary | ICD-10-CM | POA: Diagnosis not present

## 2019-05-13 DIAGNOSIS — E119 Type 2 diabetes mellitus without complications: Secondary | ICD-10-CM | POA: Diagnosis not present

## 2019-05-20 DIAGNOSIS — M21611 Bunion of right foot: Secondary | ICD-10-CM | POA: Diagnosis not present

## 2019-05-20 DIAGNOSIS — M2041 Other hammer toe(s) (acquired), right foot: Secondary | ICD-10-CM | POA: Diagnosis not present

## 2019-05-26 ENCOUNTER — Other Ambulatory Visit: Payer: Self-pay | Admitting: Family Medicine

## 2019-05-26 DIAGNOSIS — E119 Type 2 diabetes mellitus without complications: Secondary | ICD-10-CM

## 2019-05-29 ENCOUNTER — Other Ambulatory Visit: Payer: Self-pay | Admitting: Family Medicine

## 2019-05-29 DIAGNOSIS — E119 Type 2 diabetes mellitus without complications: Secondary | ICD-10-CM

## 2019-06-02 NOTE — Telephone Encounter (Signed)
Pt needs follow up for diabetes.

## 2019-06-03 ENCOUNTER — Ambulatory Visit (INDEPENDENT_AMBULATORY_CARE_PROVIDER_SITE_OTHER): Payer: Medicare HMO | Admitting: Physician Assistant

## 2019-06-03 ENCOUNTER — Encounter: Payer: Self-pay | Admitting: Physician Assistant

## 2019-06-03 ENCOUNTER — Other Ambulatory Visit: Payer: Self-pay

## 2019-06-03 VITALS — BP 130/74 | HR 75 | Ht 66.5 in | Wt 194.0 lb

## 2019-06-03 DIAGNOSIS — G629 Polyneuropathy, unspecified: Secondary | ICD-10-CM | POA: Diagnosis not present

## 2019-06-03 DIAGNOSIS — Z6830 Body mass index (BMI) 30.0-30.9, adult: Secondary | ICD-10-CM | POA: Diagnosis not present

## 2019-06-03 DIAGNOSIS — E119 Type 2 diabetes mellitus without complications: Secondary | ICD-10-CM

## 2019-06-03 DIAGNOSIS — E6609 Other obesity due to excess calories: Secondary | ICD-10-CM

## 2019-06-03 DIAGNOSIS — Z780 Asymptomatic menopausal state: Secondary | ICD-10-CM | POA: Insufficient documentation

## 2019-06-03 DIAGNOSIS — I10 Essential (primary) hypertension: Secondary | ICD-10-CM

## 2019-06-03 DIAGNOSIS — Z13820 Encounter for screening for osteoporosis: Secondary | ICD-10-CM | POA: Diagnosis not present

## 2019-06-03 DIAGNOSIS — E1169 Type 2 diabetes mellitus with other specified complication: Secondary | ICD-10-CM

## 2019-06-03 DIAGNOSIS — Z1159 Encounter for screening for other viral diseases: Secondary | ICD-10-CM

## 2019-06-03 DIAGNOSIS — E785 Hyperlipidemia, unspecified: Secondary | ICD-10-CM | POA: Diagnosis not present

## 2019-06-03 LAB — POCT GLYCOSYLATED HEMOGLOBIN (HGB A1C): Hemoglobin A1C: 6.9 % — AB (ref 4.0–5.6)

## 2019-06-03 MED ORDER — ATORVASTATIN CALCIUM 40 MG PO TABS: 40.0000 mg | ORAL_TABLET | Freq: Every day | ORAL | 3 refills | Status: DC

## 2019-06-03 MED ORDER — LOSARTAN POTASSIUM-HCTZ 100-25 MG PO TABS: 1.0000 | ORAL_TABLET | Freq: Every day | ORAL | 3 refills | Status: DC

## 2019-06-03 MED ORDER — METFORMIN HCL 1000 MG PO TABS: 1000.0000 mg | ORAL_TABLET | Freq: Two times a day (BID) | ORAL | 1 refills | Status: DC
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
## 2019-06-03 NOTE — Patient Instructions (Signed)

## 2019-06-04 ENCOUNTER — Encounter: Payer: Self-pay | Admitting: Physician Assistant

## 2019-06-04 LAB — LIPID PANEL W/REFLEX DIRECT LDL
Cholesterol: 110 mg/dL (ref <200)
HDL: 34 mg/dL — ABNORMAL LOW (ref >=50)
LDL Cholesterol (Calc): 52 mg/dL (calc)
Non-HDL Cholesterol (Calc): 76 mg/dL (calc) (ref <130)
Total CHOL/HDL Ratio: 3.2 (calc) (ref <5.0)
Triglycerides: 153 mg/dL — ABNORMAL HIGH (ref <150)

## 2019-06-04 LAB — HEPATITIS C ANTIBODY
Hepatitis C Ab: NONREACTIVE
SIGNAL TO CUT-OFF: 0.04 (ref <1.00)

## 2019-06-04 LAB — COMPLETE METABOLIC PANEL WITH GFR
AG Ratio: 1.8 (calc) (ref 1.0–2.5)
ALT: 25 U/L (ref 6–29)
AST: 23 U/L (ref 10–35)
Albumin: 4.2 g/dL (ref 3.6–5.1)
Alkaline phosphatase (APISO): 51 U/L (ref 37–153)
BUN: 18 mg/dL (ref 7–25)
CO2: 27 mmol/L (ref 20–32)
Calcium: 9.4 mg/dL (ref 8.6–10.4)
Chloride: 101 mmol/L (ref 98–110)
Creat: 0.83 mg/dL (ref 0.50–0.99)
GFR, Est African American: 83 mL/min/{1.73_m2} (ref >=60)
GFR, Est Non African American: 72 mL/min/{1.73_m2} (ref >=60)
Globulin: 2.3 g/dL (calc) (ref 1.9–3.7)
Glucose, Bld: 137 mg/dL — ABNORMAL HIGH (ref 65–99)
Potassium: 3.8 mmol/L (ref 3.5–5.3)
Sodium: 139 mmol/L (ref 135–146)
Total Bilirubin: 0.6 mg/dL (ref 0.2–1.2)
Total Protein: 6.5 g/dL (ref 6.1–8.1)

## 2019-06-04 NOTE — Progress Notes (Signed)
Lan,   LDL to goal under 70. TG just a hair elevated but MUCH better than 1 year ago. Kidney and liver look great.   Hoping for a wonderful outcome with your surgery!  -Luvenia Starch

## 2019-06-05 DIAGNOSIS — M21611 Bunion of right foot: Secondary | ICD-10-CM | POA: Diagnosis not present

## 2019-06-05 DIAGNOSIS — M2041 Other hammer toe(s) (acquired), right foot: Secondary | ICD-10-CM | POA: Diagnosis not present

## 2019-06-10 DIAGNOSIS — M25571 Pain in right ankle and joints of right foot: Secondary | ICD-10-CM | POA: Diagnosis not present

## 2019-06-10 HISTORY — PX: BUNIONECTOMY: SHX129

## 2019-06-22 ENCOUNTER — Other Ambulatory Visit: Payer: Self-pay | Admitting: Physician Assistant

## 2019-06-22 DIAGNOSIS — Z1231 Encounter for screening mammogram for malignant neoplasm of breast: Secondary | ICD-10-CM

## 2019-06-24 DIAGNOSIS — M25571 Pain in right ankle and joints of right foot: Secondary | ICD-10-CM | POA: Diagnosis not present

## 2019-07-08 DIAGNOSIS — M25571 Pain in right ankle and joints of right foot: Secondary | ICD-10-CM | POA: Diagnosis not present

## 2019-07-15 ENCOUNTER — Other Ambulatory Visit: Payer: Self-pay

## 2019-07-15 ENCOUNTER — Ambulatory Visit (INDEPENDENT_AMBULATORY_CARE_PROVIDER_SITE_OTHER): Payer: Medicare HMO

## 2019-07-15 DIAGNOSIS — Z1231 Encounter for screening mammogram for malignant neoplasm of breast: Secondary | ICD-10-CM

## 2019-07-15 DIAGNOSIS — Z13820 Encounter for screening for osteoporosis: Secondary | ICD-10-CM | POA: Diagnosis not present

## 2019-07-15 DIAGNOSIS — Z780 Asymptomatic menopausal state: Secondary | ICD-10-CM | POA: Diagnosis not present

## 2019-07-15 NOTE — Progress Notes (Signed)
GREAT news bone density is normal.

## 2019-07-17 NOTE — Progress Notes (Signed)
Normal mammogram. Follow up next year.

## 2019-07-22 ENCOUNTER — Other Ambulatory Visit: Payer: Medicare HMO

## 2019-07-30 DIAGNOSIS — H5213 Myopia, bilateral: Secondary | ICD-10-CM | POA: Diagnosis not present

## 2019-07-30 DIAGNOSIS — I10 Essential (primary) hypertension: Secondary | ICD-10-CM | POA: Diagnosis not present

## 2019-07-30 DIAGNOSIS — E7800 Pure hypercholesterolemia, unspecified: Secondary | ICD-10-CM | POA: Diagnosis not present

## 2019-07-30 DIAGNOSIS — E13311 Other specified diabetes mellitus with unspecified diabetic retinopathy with macular edema: Secondary | ICD-10-CM | POA: Diagnosis not present

## 2019-08-03 ENCOUNTER — Encounter: Payer: Self-pay | Admitting: Physician Assistant

## 2019-08-31 ENCOUNTER — Encounter: Payer: Self-pay | Admitting: Physician Assistant

## 2019-09-01 NOTE — Telephone Encounter (Signed)
Please schedule this am.

## 2019-09-01 NOTE — Telephone Encounter (Signed)
Ok perfect

## 2019-09-02 ENCOUNTER — Ambulatory Visit: Payer: Medicare HMO | Admitting: Physician Assistant

## 2019-09-08 ENCOUNTER — Other Ambulatory Visit: Payer: Self-pay

## 2019-09-08 ENCOUNTER — Ambulatory Visit (INDEPENDENT_AMBULATORY_CARE_PROVIDER_SITE_OTHER): Payer: Medicare HMO | Admitting: Physician Assistant

## 2019-09-08 VITALS — BP 154/84 | HR 80 | Ht 66.5 in | Wt 196.0 lb

## 2019-09-08 DIAGNOSIS — E66811 Obesity, class 1: Secondary | ICD-10-CM

## 2019-09-08 DIAGNOSIS — J320 Chronic maxillary sinusitis: Secondary | ICD-10-CM

## 2019-09-08 DIAGNOSIS — M150 Primary generalized (osteo)arthritis: Secondary | ICD-10-CM

## 2019-09-08 DIAGNOSIS — E6609 Other obesity due to excess calories: Secondary | ICD-10-CM

## 2019-09-08 DIAGNOSIS — H6983 Other specified disorders of Eustachian tube, bilateral: Secondary | ICD-10-CM

## 2019-09-08 DIAGNOSIS — Z6830 Body mass index (BMI) 30.0-30.9, adult: Secondary | ICD-10-CM | POA: Diagnosis not present

## 2019-09-08 DIAGNOSIS — I10 Essential (primary) hypertension: Secondary | ICD-10-CM | POA: Diagnosis not present

## 2019-09-08 DIAGNOSIS — M8949 Other hypertrophic osteoarthropathy, multiple sites: Secondary | ICD-10-CM

## 2019-09-08 DIAGNOSIS — M159 Polyosteoarthritis, unspecified: Secondary | ICD-10-CM

## 2019-09-08 DIAGNOSIS — H9312 Tinnitus, left ear: Secondary | ICD-10-CM | POA: Diagnosis not present

## 2019-09-08 DIAGNOSIS — H6993 Unspecified Eustachian tube disorder, bilateral: Secondary | ICD-10-CM

## 2019-09-08 DIAGNOSIS — E119 Type 2 diabetes mellitus without complications: Secondary | ICD-10-CM | POA: Diagnosis not present

## 2019-09-08 DIAGNOSIS — G629 Polyneuropathy, unspecified: Secondary | ICD-10-CM

## 2019-09-08 LAB — POCT GLYCOSYLATED HEMOGLOBIN (HGB A1C): Hemoglobin A1C: 7 % — AB (ref 4.0–5.6)

## 2019-09-08 MED ORDER — AMOXICILLIN-POT CLAVULANATE 875-125 MG PO TABS: 1.0000 | ORAL_TABLET | Freq: Two times a day (BID) | ORAL | 0 refills | Status: DC

## 2019-09-08 MED ORDER — AMLODIPINE BESYLATE 2.5 MG PO TABS: 2.5000 mg | ORAL_TABLET | Freq: Every day | ORAL | 2 refills | Status: DC

## 2019-09-08 MED ORDER — METHYLPREDNISOLONE 4 MG PO TBPK: ORAL_TABLET | ORAL | 0 refills | Status: DC

## 2019-09-08 NOTE — Progress Notes (Signed)
Subjective:    Patient ID: Teresa Spence, female    DOB: [birth year], 68 y.o.   MRN: [id number]  HPI  Pt is a 67 yo obese female with T2DM, HTN who presents to the clinic for 3 month follow up.   DM she is not checking her sugars. She is taking metformin regularly. She admits her DM diet is not going very well. She is also not exercising. She denies any hypoglycemic events. No open sores or wounds.   She denies any CP, palpitations, headaches, or vision changes.   She is having some ringing in left ear and intermittent pressure and popping. She had a URI a few weeks ago but now left with left sinus pressure and teeth pain. No fever, cough, SOB.   Her hand arthritis is getting worse. She is using diclofenac gel with little relief. She wonders about gabapentin for pain.   .. Active Ambulatory Problems    Diagnosis Date Noted  . Essential hypertension 12/27/2016  . Type 2 diabetes mellitus without complication, without long-term current use of insulin ([place]) 12/27/2016  . Palpitations 12/27/2016  . Cellulitis of toe of left foot 12/29/2016  . 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 Absent pedal pulses 12/29/2016  . Choking 05/08/2017  . Numbness and tingling in both hands 05/08/2017  . Primary osteoarthritis involving multiple joints 05/08/2017  . Toenail fungus 10/25/2017  . Class 1 obesity due to excess calories with serious comorbidity and body mass index (BMI) of 30.0 to 30.9 in adult 11/05/2017  . Hyperlipidemia associated with type 2 diabetes mellitus ([place]) 05/06/2018  . Seborrheic keratoses 05/06/2018  . Dermoid cyst of right ear 05/06/2018  . Chest tightness 05/06/2018  . Colon polyps 05/21/2018  . Generalized osteoarthritis of hand 09/11/2018  . Neuropathy 06/03/2019  . Post-menopausal 06/03/2019   Resolved Ambulatory Problems    Diagnosis Date Noted  . No Resolved Ambulatory Problems   Past Medical History:  Diagnosis Date  . Diabetes mellitus without complication ([place])   .  Hypertension       Review of Systems See HPI.     Objective:   Physical Exam Vitals reviewed.  Constitutional:      Appearance: Normal appearance. She is obese.  HENT:     Head: Normocephalic.     Comments: Tenderness over left maxillary sinus.     Right Ear: Tympanic membrane, ear canal and external ear normal. There is no impacted cerumen.     Left Ear: Tympanic membrane, ear canal and external ear normal. There is no impacted cerumen.     Nose: Nose normal.     Mouth/Throat:     Mouth: Mucous membranes are moist.  Eyes:     Extraocular Movements: Extraocular movements intact.     Pupils: Pupils are equal, round, and reactive to light.  Cardiovascular:     Rate and Rhythm: Normal rate and regular rhythm.     Pulses: Normal pulses.  Musculoskeletal:     Cervical back: Normal range of motion.  Lymphadenopathy:     Cervical: No cervical adenopathy.  Neurological:     General: No focal deficit present.     Mental Status: She is alert.  Psychiatric:        Mood and Affect: Mood normal.           Assessment & Plan:  [name] KitchenMarland KitchenLatonya was seen today for diabetes.  Diagnoses and all orders for this visit:  Type 2 diabetes mellitus without complication, without long-term current use of insulin (  [place]) -     POCT glycosylated hemoglobin (Hb A1C)  Left maxillary sinusitis -     methylPREDNISolone (MEDROL DOSEPAK) 4 MG TBPK tablet; Take as directed by package insert. -     amoxicillin-clavulanate (AUGMENTIN) 875-125 MG tablet; Take 1 tablet by mouth 2 (two) times daily. Take 1 tab twice a day for 10 days.  ETD (Eustachian tube dysfunction), bilateral  Essential hypertension -     amLODipine (NORVASC) 2.5 MG tablet; Take 1 tablet (2.5 mg total) by mouth daily.  Neuropathy  Primary osteoarthritis involving multiple joints  Class 1 obesity due to excess calories with serious comorbidity and body mass index (BMI) of 30.0 to 30.9 in adult    .[name] Lab Results  Component Value  Date   HGBA1C 7.0 (A) 09/08/2019   A1C is stable but increasing. If no improvement of A1C in 3 month suggest adding another medication.  Discussed DM diet and importance of exercise.  On STATIN.  On ARB. BP not to goal today. Added norvasc. Continue to monitor BP at home.  Flu and pneumonia vaccine UTD.  Eye exam UTD.  Follow up in 3 months.   Appears to have some left maxillary sinusitis. Treated with medrol dose pack and augmentin. Consider OTC flonase. Follow up as needed.   Discussed gabapentin for pain. Will hold for now due to side effects discussed. Consider glucoasamine chondrotin and hand exercises. Follow up in 3 months.

## 2019-09-13 ENCOUNTER — Encounter: Payer: Self-pay | Admitting: Physician Assistant

## 2019-09-14 DIAGNOSIS — H9312 Tinnitus, left ear: Secondary | ICD-10-CM | POA: Insufficient documentation

## 2019-09-14 DIAGNOSIS — H6993 Unspecified Eustachian tube disorder, bilateral: Secondary | ICD-10-CM | POA: Insufficient documentation

## 2019-09-14 DIAGNOSIS — H6983 Other specified disorders of Eustachian tube, bilateral: Secondary | ICD-10-CM | POA: Insufficient documentation

## 2019-09-16 DIAGNOSIS — M25572 Pain in left ankle and joints of left foot: Secondary | ICD-10-CM | POA: Diagnosis not present

## 2019-10-02 DIAGNOSIS — E119 Type 2 diabetes mellitus without complications: Secondary | ICD-10-CM | POA: Diagnosis not present

## 2019-10-02 DIAGNOSIS — Z01812 Encounter for preprocedural laboratory examination: Secondary | ICD-10-CM | POA: Diagnosis not present

## 2019-10-02 DIAGNOSIS — M21612 Bunion of left foot: Secondary | ICD-10-CM | POA: Diagnosis not present

## 2019-10-09 NOTE — Progress Notes (Signed)
Subjective:   Teresa Spence is a 69 y.o. female who presents for an Initial Medicare Annual Wellness Visit.  Review of Systems    No ROS.  Medicare Wellness Virtual Visit.  Visual/audio telehealth visit, UTA vital signs.   See social history for additional risk factors.     Cardiac Risk Factors include: advanced age (>44men, >88 women);diabetes mellitus;hypertension Sleep patterns: Getting 8 hours of sleep a night. Does not wake up to void at night. Wakes up and feels rested and ready for the day.  Home Safety/Smoke Alarms: Feels safe in home. Smoke alarms in place.  Living environment; Lives with husband in a 2 story home and hand rails are on steps. Shower is a walk in shower and grab bars in place. Seat Belt Safety/Bike Helmet: Wears seat belt.   Female:   Pap-  Aged out     Mammo- UTD       Dexa scan- UTD       CCS- UTD     Objective:    Today's Vitals   10/13/19 0931  BP: 138/62  Pulse: 77  Weight: 188 lb (85.3 kg)  Height: 5' 6.5" (1.689 m)   Body mass index is 29.89 kg/m.  Advanced Directives 10/13/2019 12/27/2016  Does Patient Have a Medical Advance Directive? Yes Yes  Type of Paramedic of [place];Living will [place]  Does patient want to make changes to medical advance directive? No - Patient declined -  Copy of [place] in Chart? No - copy requested -    Current Medications (verified) Outpatient Encounter Medications as of 10/13/2019  Medication Sig  . atorvastatin (LIPITOR) 40 MG tablet Take 1 tablet (40 mg total) by mouth daily.  . B Complex-C (B-COMPLEX WITH VITAMIN C) tablet Take 1 tablet by mouth daily.  . cholecalciferol (VITAMIN D3) 25 MCG (1000 UT) tablet Take 1,000 Units by mouth daily.  . diclofenac sodium (VOLTAREN) 1 % GEL Apply 4 g topically 4 (four) times daily. To affected joint.  [name] losartan-hydrochlorothiazide (HYZAAR) 100-25 MG tablet Take 1 tablet by mouth daily.  .  meloxicam (MOBIC) 15 MG tablet Take 1 tablet (15 mg total) by mouth daily.  . metFORMIN (GLUCOPHAGE) 1000 MG tablet Take 1 tablet (1,000 mg total) by mouth 2 (two) times daily with a meal.  . Misc Natural Products (GLUCOSAMINE CHOND COMPLEX/MSM PO) Take by mouth 3 (three) times daily.  . Misc Natural Products (TURMERIC CURCUMIN) CAPS Take by mouth.  [name] amLODipine (NORVASC) 2.5 MG tablet Take 1 tablet (2.5 mg total) by mouth daily. (Patient not taking: Reported on 10/13/2019)  . methylPREDNISolone (MEDROL DOSEPAK) 4 MG TBPK tablet Take as directed by package insert. (Patient not taking: Reported on 10/13/2019)  . [DISCONTINUED] amoxicillin-clavulanate (AUGMENTIN) 875-125 MG tablet Take 1 tablet by mouth 2 (two) times daily. Take 1 tab twice a day for 10 days.   No facility-administered encounter medications on file as of 10/13/2019.    Allergies (verified) Lamisil [terbinafine]   History: Past Medical History:  Diagnosis Date  . Diabetes mellitus without complication ([place])   . Hypertension    Past Surgical History:  Procedure Laterality Date  . BUNIONECTOMY Right 06/10/2019  . HAMMER TOE SURGERY    . MEDIAL PARTIAL KNEE REPLACEMENT    . TONSILLECTOMY     Family History  Problem Relation Age of Onset  . Heart attack Brother   . Diabetes Mother   . Hypertension Mother   . Hypertension  Father   . COPD Father   . Stroke Maternal Grandmother   . Heart attack Maternal Grandfather   . Emphysema Neg Hx    Social History   Socioeconomic History  . Marital status: Married    Spouse name: [name]  . Number of children: 1  . Years of education: 6  . Highest education level: Master's degree (e.g., MA, MS, MEng, MEd, MSW, MBA)  Occupational History  . Occupation: Optometrist    Comment: retired  Tobacco Use  . Smoking status: Former Research scientist (life sciences)  . Smokeless tobacco: Never Used  Substance and Sexual Activity  . Alcohol use: Yes    Alcohol/week: 1.0 standard drinks    Types: 1 Glasses of  wine per week    Comment: 1 glass a month  . Drug use: No  . Sexual activity: Not Currently  Other Topics Concern  . Not on file  Social History Narrative   Clean house. Moved recently and unpacking boxes. Knitting is a hobby, painting is a hobby.   Social Determinants of Health   Financial Resource Strain:   . Difficulty of Paying Living Expenses: Not on file  Food Insecurity:   . Worried About Charity fundraiser in the Last Year: Not on file  . Ran Out of Food in the Last Year: Not on file  Transportation Needs:   . Lack of Transportation (Medical): Not on file  . Lack of Transportation (Non-Medical): Not on file  Physical Activity:   . Days of Exercise per Week: Not on file  . Minutes of Exercise per Session: Not on file  Stress:   . Feeling of Stress : Not on file  Social Connections:   . Frequency of Communication with Friends and Family: Not on file  . Frequency of Social Gatherings with Friends and Family: Not on file  . Attends Religious Services: Not on file  . Active Member of Clubs or Organizations: Not on file  . Attends Archivist Meetings: Not on file  . Marital Status: Not on file    Tobacco Counseling Counseling given: Not Answered   Clinical Intake:  Pre-visit preparation completed: Yes  Pain : No/denies pain     Nutritional Risks: None Diabetes: Yes CBG done?: No Did pt. bring in CBG monitor from home?: No  How often do you need to have someone help you when you read instructions, pamphlets, or other written materials from your doctor or pharmacy?: 1 - Never What is the last grade level you completed in school?: 17  Interpreter Needed?: No  Information entered by :: Orlie Dakin, LPN   Activities of Daily Living In your present state of health, do you have any difficulty performing the following activities: 10/13/2019  Hearing? N  Vision? N  Comment wears contacts  Difficulty concentrating or making decisions? N  Walking or  climbing stairs? N  Dressing or bathing? N  Doing errands, shopping? N  Preparing Food and eating ? N  Using the Toilet? N  In the past six months, have you accidently leaked urine? Y  Comment at ttimes if doesn't get there in time  Do you have problems with loss of bowel control? N  Managing your Medications? N  Managing your Finances? N  Housekeeping or managing your Housekeeping? N  Some recent data might be hidden     Immunizations and Health Maintenance Immunization History  Administered Date(s) Administered  . Influenza,inj,Quad PF,6+ Mos 08/09/2017  . Influenza-Unspecified 06/19/2018  . Pneumococcal Conjugate-13  05/06/2017  . Pneumococcal Polysaccharide-23 11/05/2017  . Tdap 11/24/2016   There are no preventive care reminders to display for this patient.  Patient Care Team: Lavada Mesi as PCP - General (Family Medicine)  Indicate any recent Medical Services you may have received from other than [facility] providers in the past year (date may be approximate).     Assessment:   This is a routine wellness examination for Teresa Spence.Physical assessment deferred to PCP.   Hearing/Vision screen No exam data present  Dietary issues and exercise activities discussed: Current Exercise Habits: The patient does not participate in regular exercise at present, Exercise limited by: None identified Diet Eats a healthy diet of vegetables, fruits and proteins. Breakfast: English muffin, egg and sausage and coffee Lunch: Meat and vegetables or soups Dinner: Fruit and yogurt, waffle with strawberries. Drinks 6-8 glasses of water daily       Goals    . Patient Stated     Patient stated increasing more vegetables into her diet.      Depression Screen PHQ 2/9 Scores 10/13/2019 09/09/2018 05/21/2018 05/06/2018 06/10/2017 05/06/2017 12/27/2016  PHQ - 2 Score 0 0 0 0 0 0 0  PHQ- 9 Score - - 0 0 - - -    Fall Risk Fall Risk  10/13/2019 05/21/2018  Falls in the past year? 0 No  Risk for  fall due to : Impaired balance/gait -  Risk for fall due to: Comment balance issues- one leg shorter than the other -  Follow up Falls prevention discussed -    Is the patient's home free of loose throw rugs in walkways, pet beds, electrical cords, etc?   yes      Grab bars in the bathroom? yes      Handrails on the stairs?   yes      Adequate lighting?   yes  Cognitive Function:     6CIT Screen 10/13/2019  What Year? 0 points  What month? 0 points  What time? 0 points  Count back from 20 0 points  Months in reverse 0 points  Repeat phrase 0 points  Total Score 0    Screening Tests Health Maintenance  Topic Date Due  . HEMOGLOBIN A1C  03/08/2020  . FOOT EXAM  06/02/2020  . OPHTHALMOLOGY EXAM  06/29/2020  . MAMMOGRAM  07/14/2021  . COLONOSCOPY  05/15/2028  . TETANUS/TDAP  04/04/2029  . INFLUENZA VACCINE  Completed  . DEXA SCAN  Completed  . Hepatitis C Screening  Completed  . PNA vac Low Risk Adult  Completed     Plan:  Please schedule your next medicare wellness visit with me in 1 yr.  Teresa Spence , Thank you for taking time to come for your Medicare Wellness Visit. I appreciate your ongoing commitment to your health goals. Please review the following plan we discussed and let me know if I can assist you in the future.  Continue doing brain stimulating activities (puzzles, reading, adult coloring books, staying active) to keep memory sharp.  Bring a copy of your living will and/or healthcare power of attorney to your next office visit.   These are the goals we discussed: Goals    . Patient Stated     Patient stated increasing more vegetables into her diet.       This is a list of the screening recommended for you and due dates:  Health Maintenance  Topic Date Due  . Hemoglobin A1C  03/08/2020  . Complete foot  exam   06/02/2020  . Eye exam for diabetics  06/29/2020  . Mammogram  07/14/2021  . Colon Cancer Screening  05/15/2028  . Tetanus Vaccine  04/04/2029   . Flu Shot  Completed  . DEXA scan (bone density measurement)  Completed  .  Hepatitis C: One time screening is recommended by Center for Disease Control  (CDC) for  adults born from [birth year] through 1966.   Completed  . Pneumonia vaccines  Completed     I have personally reviewed and noted the following in the patient's chart:   . Medical and social history . Use of alcohol, tobacco or illicit drugs  . Current medications and supplements . Functional ability and status . Nutritional status . Physical activity . Advanced directives . List of other physicians . Hospitalizations, surgeries, and ER visits in previous 12 months . Vitals . Screenings to include cognitive, depression, and falls . Referrals and appointments  In addition, I have reviewed and discussed with patient certain preventive protocols, quality metrics, and best practice recommendations. A written personalized care plan for preventive services as well as general preventive health recommendations were provided to patient.     Joanne Chars, LPN   [id number]

## 2019-10-13 ENCOUNTER — Ambulatory Visit (INDEPENDENT_AMBULATORY_CARE_PROVIDER_SITE_OTHER): Payer: Medicare HMO | Admitting: *Deleted

## 2019-10-13 VITALS — BP 138/62 | HR 77 | Ht 66.5 in | Wt 188.0 lb

## 2019-10-13 DIAGNOSIS — Z0000 Encounter for general adult medical examination without abnormal findings: Secondary | ICD-10-CM

## 2019-10-13 NOTE — Patient Instructions (Signed)
Please schedule your next medicare wellness visit with me in 1 yr.  Teresa Spence , Thank you for taking time to come for your Medicare Wellness Visit. I appreciate your ongoing commitment to your health goals. Please review the following plan we discussed and let me know if I can assist you in the future.  Continue doing brain stimulating activities (puzzles, reading, adult coloring books, staying active) to keep memory sharp.  Bring a copy of your living will and/or healthcare power of attorney to your next office visit.  These are the goals we discussed: Goals    . Patient Stated     Patient stated increasing more vegetables into her diet.

## 2019-10-19 DIAGNOSIS — Z01812 Encounter for preprocedural laboratory examination: Secondary | ICD-10-CM | POA: Diagnosis not present

## 2019-10-19 DIAGNOSIS — Z20822 Contact with and (suspected) exposure to covid-19: Secondary | ICD-10-CM | POA: Diagnosis not present

## 2019-10-22 DIAGNOSIS — I119 Hypertensive heart disease without heart failure: Secondary | ICD-10-CM | POA: Diagnosis not present

## 2019-10-22 DIAGNOSIS — K219 Gastro-esophageal reflux disease without esophagitis: Secondary | ICD-10-CM | POA: Diagnosis not present

## 2019-10-22 DIAGNOSIS — M21612 Bunion of left foot: Secondary | ICD-10-CM | POA: Diagnosis not present

## 2019-10-22 DIAGNOSIS — R011 Cardiac murmur, unspecified: Secondary | ICD-10-CM | POA: Diagnosis not present

## 2019-10-22 DIAGNOSIS — I10 Essential (primary) hypertension: Secondary | ICD-10-CM | POA: Diagnosis not present

## 2019-10-22 DIAGNOSIS — E119 Type 2 diabetes mellitus without complications: Secondary | ICD-10-CM | POA: Diagnosis not present

## 2019-10-22 DIAGNOSIS — G8918 Other acute postprocedural pain: Secondary | ICD-10-CM | POA: Diagnosis not present

## 2019-10-22 DIAGNOSIS — Z7984 Long term (current) use of oral hypoglycemic drugs: Secondary | ICD-10-CM | POA: Diagnosis not present

## 2019-10-22 DIAGNOSIS — M2012 Hallux valgus (acquired), left foot: Secondary | ICD-10-CM | POA: Diagnosis not present

## 2019-10-26 DIAGNOSIS — M25572 Pain in left ankle and joints of left foot: Secondary | ICD-10-CM | POA: Diagnosis not present

## 2019-10-28 ENCOUNTER — Other Ambulatory Visit: Payer: Self-pay

## 2019-10-28 ENCOUNTER — Ambulatory Visit (INDEPENDENT_AMBULATORY_CARE_PROVIDER_SITE_OTHER): Payer: Medicare HMO

## 2019-10-28 ENCOUNTER — Encounter: Payer: Self-pay | Admitting: Medical-Surgical

## 2019-10-28 ENCOUNTER — Ambulatory Visit (INDEPENDENT_AMBULATORY_CARE_PROVIDER_SITE_OTHER): Payer: Medicare HMO | Admitting: Medical-Surgical

## 2019-10-28 VITALS — BP 135/77 | HR 82 | Temp 98.0°F | Ht 66.5 in | Wt 198.1 lb

## 2019-10-28 DIAGNOSIS — M62838 Other muscle spasm: Secondary | ICD-10-CM | POA: Diagnosis not present

## 2019-10-28 DIAGNOSIS — M50321 Other cervical disc degeneration at C4-C5 level: Secondary | ICD-10-CM | POA: Diagnosis not present

## 2019-10-28 DIAGNOSIS — R202 Paresthesia of skin: Secondary | ICD-10-CM

## 2019-10-28 MED ORDER — METHOCARBAMOL 500 MG PO TABS: 500.0000 mg | ORAL_TABLET | Freq: Three times a day (TID) | ORAL | 0 refills | Status: DC

## 2019-10-28 NOTE — Progress Notes (Signed)
Subjective:    CC: Left hand numbness/tingling, decreased fine motor movements  HPI: Pleasant 69 year old female presenting today reporting new onset left arm weakness after showering yesterday morning.  Patient is left-handed and able to do gross motor movements without difficulty.  Hand does feel numb with some tingling.  Has tightness in the left shoulder and trapezius area.  Difficulty with activities such as writing with a pen, brushing teeth, and using a knife to make avocado toast.  No difficulty with knitting or water coloring.  Has not taken any medications but did try heating pad which helped some.  Tried her husband's TENS unit, no relief.  Had foot surgery last Friday and did not start prednisone Dosepak that was prescribed for her bilateral hand arthritis.  Also did not start amlodipine 2.5 mg daily as prescribed.  Denies headache, lightheadedness/dizziness, vision changes, hearing changes, confusion, difficulty with speech, headaches, or gait changes.  Endorses history of cervical degeneration diagnosed approximately 10 years ago.  I reviewed the past medical history, family history, social history, surgical history, and allergies today and no changes were needed.  Please see the problem list section below in epic for further details.  Past Medical History: Past Medical History:  Diagnosis Date  . Diabetes mellitus without complication ([place])   . Hypertension    Past Surgical History: Past Surgical History:  Procedure Laterality Date  . BUNIONECTOMY Right 06/10/2019  . HAMMER TOE SURGERY    . MEDIAL PARTIAL KNEE REPLACEMENT    . TONSILLECTOMY     Social History: Social History   Socioeconomic History  . Marital status: Married    Spouse name: [name]  . Number of children: 1  . Years of education: 46  . Highest education level: Master's degree (e.g., MA, MS, MEng, MEd, MSW, MBA)  Occupational History  . Occupation: Optometrist    Comment: retired  Tobacco Use  .  Smoking status: Former Research scientist (life sciences)  . Smokeless tobacco: Never Used  Substance and Sexual Activity  . Alcohol use: Yes    Alcohol/week: 1.0 standard drinks    Types: 1 Glasses of wine per week    Comment: 1 glass a month  . Drug use: No  . Sexual activity: Not Currently  Other Topics Concern  . Not on file  Social History Narrative   Clean house. Moved recently and unpacking boxes. Knitting is a hobby, painting is a hobby.   Social Determinants of Health   Financial Resource Strain:   . Difficulty of Paying Living Expenses: Not on file  Food Insecurity:   . Worried About Charity fundraiser in the Last Year: Not on file  . Ran Out of Food in the Last Year: Not on file  Transportation Needs:   . Lack of Transportation (Medical): Not on file  . Lack of Transportation (Non-Medical): Not on file  Physical Activity:   . Days of Exercise per Week: Not on file  . Minutes of Exercise per Session: Not on file  Stress:   . Feeling of Stress : Not on file  Social Connections:   . Frequency of Communication with Friends and Family: Not on file  . Frequency of Social Gatherings with Friends and Family: Not on file  . Attends Religious Services: Not on file  . Active Member of Clubs or Organizations: Not on file  . Attends Archivist Meetings: Not on file  . Marital Status: Not on file   Family History: Family History  Problem Relation Age of  Onset  . Heart attack Brother   . Diabetes Mother   . Hypertension Mother   . Hypertension Father   . COPD Father   . Stroke Maternal Grandmother   . Heart attack Maternal Grandfather   . Emphysema Neg Hx    Allergies: Allergies  Allergen Reactions  . Lamisil [Terbinafine]     Stomach upset and Diarrhea with excess gas in tummy.    Medications: See med rec.  Review of Systems: No fevers, chills, night sweats, weight loss, chest pain, or shortness of breath.   Objective:    General: Well Developed, well nourished, and in no  acute distress.  Neuro: Alert and oriented x3, sensation grossly intact.  BUE DTRs normal. HEENT: Normocephalic, atraumatic.  Skin: Warm and dry. Cardiac: Regular rate and rhythm, no murmurs rubs or gallops, no lower extremity edema.  Bilateral upper extremity radial and brachial pulses 2+.  Cap refill less than 3 seconds. Respiratory: Clear to auscultation bilaterally. Not using accessory muscles, speaking in full sentences. MSK: Bilateral upper extremity strength 5/5.  Hand strength equal bilaterally.  Left trapezius from lower edge of scapula to top of shoulder tender, firm, swollen.  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  Will get updated cervical spine x-rays today.  Encouraged conservative treatment including heat/ice, stretching, massage, and cervical exercises (handout given).  Discussed starting prednisone.  She will contact her foot surgeon and get clearance for this then start the [place] she has at home.  If no improvement in symptoms, will need to see Dr. Darene Lamer for further evaluation.  Trapezius muscle spasm Methocarbamol 500 mg 3 times daily as needed for muscle spasm.  Conservative treatment as described above.  Return if symptoms worsen or fail to improve.  ___________________________________________ Clearnce Sorrel, DNP, APRN, FNP-BC [facility] [place]

## 2019-10-30 ENCOUNTER — Encounter: Payer: Self-pay | Admitting: Physician Assistant

## 2019-11-09 DIAGNOSIS — M25572 Pain in left ankle and joints of left foot: Secondary | ICD-10-CM | POA: Diagnosis not present

## 2019-11-16 ENCOUNTER — Other Ambulatory Visit: Payer: Self-pay | Admitting: Physician Assistant

## 2019-11-16 DIAGNOSIS — E119 Type 2 diabetes mellitus without complications: Secondary | ICD-10-CM

## 2019-11-16 DIAGNOSIS — M159 Polyosteoarthritis, unspecified: Secondary | ICD-10-CM

## 2019-11-16 NOTE — Telephone Encounter (Signed)
Last filled 09/09/2018 #30 with 5 refills.  Has appt scheduled 11/30/2019

## 2019-11-23 DIAGNOSIS — M25572 Pain in left ankle and joints of left foot: Secondary | ICD-10-CM | POA: Diagnosis not present

## 2019-11-30 ENCOUNTER — Encounter: Payer: Self-pay | Admitting: Physician Assistant

## 2019-11-30 ENCOUNTER — Other Ambulatory Visit: Payer: Self-pay

## 2019-11-30 ENCOUNTER — Ambulatory Visit (INDEPENDENT_AMBULATORY_CARE_PROVIDER_SITE_OTHER): Payer: Medicare HMO | Admitting: Physician Assistant

## 2019-11-30 VITALS — BP 131/63 | HR 100 | Ht 66.5 in | Wt 195.0 lb

## 2019-11-30 DIAGNOSIS — E119 Type 2 diabetes mellitus without complications: Secondary | ICD-10-CM

## 2019-11-30 DIAGNOSIS — M5030 Other cervical disc degeneration, unspecified cervical region: Secondary | ICD-10-CM | POA: Diagnosis not present

## 2019-11-30 DIAGNOSIS — R202 Paresthesia of skin: Secondary | ICD-10-CM

## 2019-11-30 DIAGNOSIS — R200 Anesthesia of skin: Secondary | ICD-10-CM | POA: Diagnosis not present

## 2019-11-30 DIAGNOSIS — I10 Essential (primary) hypertension: Secondary | ICD-10-CM

## 2019-11-30 DIAGNOSIS — R29818 Other symptoms and signs involving the nervous system: Secondary | ICD-10-CM | POA: Diagnosis not present

## 2019-11-30 DIAGNOSIS — R29898 Other symptoms and signs involving the musculoskeletal system: Secondary | ICD-10-CM | POA: Diagnosis not present

## 2019-11-30 LAB — POCT GLYCOSYLATED HEMOGLOBIN (HGB A1C): Hemoglobin A1C: 6.8 % — AB (ref 4.0–5.6)

## 2019-11-30 MED ORDER — METFORMIN HCL 1000 MG PO TABS: ORAL_TABLET | ORAL | 2 refills | Status: DC

## 2019-11-30 MED ORDER — DIAZEPAM 5 MG PO TABS: ORAL_TABLET | ORAL | 0 refills | Status: DC

## 2019-11-30 MED ORDER — AMLODIPINE BESYLATE 2.5 MG PO TABS: 2.5000 mg | ORAL_TABLET | Freq: Every day | ORAL | 1 refills | Status: DC

## 2019-11-30 NOTE — Patient Instructions (Signed)
After MRI schedule with Dr. Darene Lamer.

## 2019-11-30 NOTE — Progress Notes (Signed)
67 

## 2019-11-30 NOTE — Progress Notes (Signed)
Subjective:    Patient ID: Teresa Spence, female    DOB: 1950-10-09, 69 y.o.   MRN: [id number]  HPI  Pt is a 69 yo female with T2DM, HTN, HLD, neuropathy who presents to the clinic for 3 months follow up and medication refills.   HTN- no CP, palpitations, headaches or vision changes. She did add norvasc to hyzaar.    DM- she is not really checking sugars. She is trying to be more active and make better choices. Taking metformin daily. No hypoglycemic events. No open sores or wounds. She has known neuropathy of feet.   She continues to have numbness and tingling of both hands and index fingers. She saw Teresa Bouche, NP in office for left sided numbness and tingling and sudden weakness a month ago after drying her hair and left arm going numb and weak. Her strength came mostly back but she still has fine motor control. If she is not looking at her hands then its like they do not work. It is really hard to knit. Her index and middle fingers are very numb all the time. She is does not have and signficant neck pain. Her upper left back and shoulders feel tight at times. She has been doing exercise given and was told she had a lot of arthritis in neck.   .. Active Ambulatory Problems    Diagnosis Date Noted  . Essential hypertension 12/27/2016  . Type 2 diabetes mellitus without complication, without long-term current use of insulin (Teresa Spence) 12/27/2016  . Palpitations 12/27/2016  . Cellulitis of toe of left foot 12/29/2016  . 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 Absent pedal pulses 12/29/2016  . Choking 05/08/2017  . Numbness and tingling in both hands 05/08/2017  . Primary osteoarthritis involving multiple joints 05/08/2017  . Toenail fungus 10/25/2017  . Class 1 obesity due to excess calories with serious comorbidity and body mass index (BMI) of 30.0 to 30.9 in adult 11/05/2017  . Hyperlipidemia associated with type 2 diabetes mellitus ([place]) 05/06/2018  . Seborrheic keratoses 05/06/2018  . Dermoid cyst of  right ear 05/06/2018  . Chest tightness 05/06/2018  . Colon polyps 05/21/2018  . Generalized osteoarthritis of hand 09/11/2018  . Neuropathy 06/03/2019  . Post-menopausal 06/03/2019  . Tinnitus, left 09/14/2019  . ETD (Eustachian tube dysfunction), bilateral 09/14/2019  . DDD (degenerative disc disease), cervical 12/01/2019  . Fine motor skill loss 12/01/2019   Resolved Ambulatory Problems    Diagnosis Date Noted  . No Resolved Ambulatory Problems   Past Medical History:  Diagnosis Date  . Diabetes mellitus without complication ([place])   . Hypertension       Review of Systems See HPI.     Objective:   Physical Exam Vitals reviewed.  Constitutional:      Appearance: Normal appearance.  HENT:     Head: Normocephalic.  Neck:     Vascular: No carotid bruit.  Cardiovascular:     Rate and Rhythm: Normal rate and regular rhythm.     Pulses: Normal pulses.  Pulmonary:     Effort: Pulmonary effort is normal.     Breath sounds: Normal breath sounds.  Musculoskeletal:     Cervical back: Normal range of motion.     Comments: No pain over cervical spine to palpation.  NROM of neck.  Tightness over upper left back.  NROM of bilateral shoulders.  Negative phalens/tinels.   Lymphadenopathy:     Cervical: No cervical adenopathy.  Neurological:     General: No  focal deficit present.     Mental Status: She is alert and oriented to person, place, and time.  Psychiatric:        Mood and Affect: Mood normal.           Assessment & Plan:  [name] KitchenMarland KitchenShamya was seen today for diabetes.  Diagnoses and all orders for this visit:  Type 2 diabetes mellitus without complication, without long-term current use of insulin (HCC) -     POCT glycosylated hemoglobin (Hb A1C) -     metFORMIN (GLUCOPHAGE) 1000 MG tablet; TAKE ONE TABLET BY MOUTH TWICE A DAY WITH A MEAL  Essential hypertension -     amLODipine (NORVASC) 2.5 MG tablet; Take 1 tablet (2.5 mg total) by mouth daily.  Numbness and  tingling in both hands -     NCV with EMG(electromyography)  DDD (degenerative disc disease), cervical -     NCV with EMG(electromyography)  Other cervical disc degeneration, unspecified cervical region -     MR Cervical Spine Wo Contrast  Fine motor skill loss  Other orders -     diazepam (VALIUM) 5 MG tablet; Take 2 tab PO 1 hours before procedure or imaging.   .. Results for orders placed or performed in visit on 11/30/19  POCT glycosylated hemoglobin (Hb A1C)  Result Value Ref Range   Hemoglobin A1C 6.8 (A) 4.0 - 5.6 %   HbA1c POC (<> result, manual entry)     HbA1c, POC (prediabetic range)     HbA1c, POC (controlled diabetic range)     A1C improving and under goal of 7.  Continue metformin.  Continue to work on diet and staying active.  On ARB. BP to goal. Refilled hyzaar and norvasc. On STATIN.  Eye and foot exam UTD.  Pneumonia and flu shot UTD.   Encouraged covid vaccine and answered questions.   Pt has cervical DDD. Will get MRI of neck and EMG for numbness and tingling.  Continue mobic daily and exercises.  Follow up with Teresa Spence after MRI.  Could be her neuropathy worsening but she has never hand in finger tips. She does have a lot of arthritis in neck.  Valium due to claustraphobia.

## 2019-12-01 DIAGNOSIS — R29898 Other symptoms and signs involving the musculoskeletal system: Secondary | ICD-10-CM | POA: Insufficient documentation

## 2019-12-01 DIAGNOSIS — R29818 Other symptoms and signs involving the nervous system: Secondary | ICD-10-CM | POA: Insufficient documentation

## 2019-12-01 DIAGNOSIS — M5030 Other cervical disc degeneration, unspecified cervical region: Secondary | ICD-10-CM | POA: Insufficient documentation

## 2019-12-04 ENCOUNTER — Encounter: Payer: Self-pay | Admitting: Physician Assistant

## 2019-12-07 ENCOUNTER — Ambulatory Visit: Payer: Medicare HMO | Admitting: Physician Assistant

## 2019-12-09 DIAGNOSIS — S91302A Unspecified open wound, left foot, initial encounter: Secondary | ICD-10-CM | POA: Diagnosis not present

## 2019-12-09 DIAGNOSIS — S91109A Unspecified open wound of unspecified toe(s) without damage to nail, initial encounter: Secondary | ICD-10-CM | POA: Diagnosis not present

## 2019-12-09 DIAGNOSIS — L97522 Non-pressure chronic ulcer of other part of left foot with fat layer exposed: Secondary | ICD-10-CM | POA: Diagnosis not present

## 2019-12-16 DIAGNOSIS — L97522 Non-pressure chronic ulcer of other part of left foot with fat layer exposed: Secondary | ICD-10-CM | POA: Diagnosis not present

## 2019-12-19 ENCOUNTER — Ambulatory Visit (INDEPENDENT_AMBULATORY_CARE_PROVIDER_SITE_OTHER): Payer: Medicare HMO

## 2019-12-19 ENCOUNTER — Other Ambulatory Visit: Payer: Self-pay

## 2019-12-19 DIAGNOSIS — M5030 Other cervical disc degeneration, unspecified cervical region: Secondary | ICD-10-CM

## 2019-12-19 DIAGNOSIS — M4802 Spinal stenosis, cervical region: Secondary | ICD-10-CM | POA: Diagnosis not present

## 2019-12-21 NOTE — Progress Notes (Signed)
Teresa Spence,   MRI shows lots of degeneration/arthritis in neck and thoracic spine. You also have some narrowing in the cervical spine as well. Certainly reason for pain and even radiating symptoms down arms. I would like for you to schedule with Dr. Darene Lamer for treatment planing.

## 2019-12-22 ENCOUNTER — Encounter: Payer: Self-pay | Admitting: Physician Assistant

## 2019-12-22 NOTE — Telephone Encounter (Signed)
Routing to provider  

## 2019-12-23 DIAGNOSIS — Z79899 Other long term (current) drug therapy: Secondary | ICD-10-CM | POA: Diagnosis not present

## 2019-12-23 DIAGNOSIS — M10079 Idiopathic gout, unspecified ankle and foot: Secondary | ICD-10-CM | POA: Diagnosis not present

## 2019-12-23 DIAGNOSIS — M86172 Other acute osteomyelitis, left ankle and foot: Secondary | ICD-10-CM | POA: Diagnosis not present

## 2019-12-25 DIAGNOSIS — R011 Cardiac murmur, unspecified: Secondary | ICD-10-CM | POA: Diagnosis not present

## 2019-12-25 DIAGNOSIS — M86172 Other acute osteomyelitis, left ankle and foot: Secondary | ICD-10-CM | POA: Diagnosis not present

## 2019-12-25 DIAGNOSIS — Z7984 Long term (current) use of oral hypoglycemic drugs: Secondary | ICD-10-CM | POA: Diagnosis not present

## 2019-12-25 DIAGNOSIS — E1169 Type 2 diabetes mellitus with other specified complication: Secondary | ICD-10-CM | POA: Diagnosis not present

## 2019-12-25 DIAGNOSIS — K219 Gastro-esophageal reflux disease without esophagitis: Secondary | ICD-10-CM | POA: Diagnosis not present

## 2019-12-25 DIAGNOSIS — E11621 Type 2 diabetes mellitus with foot ulcer: Secondary | ICD-10-CM | POA: Diagnosis not present

## 2019-12-25 DIAGNOSIS — I119 Hypertensive heart disease without heart failure: Secondary | ICD-10-CM | POA: Diagnosis not present

## 2019-12-25 DIAGNOSIS — L97524 Non-pressure chronic ulcer of other part of left foot with necrosis of bone: Secondary | ICD-10-CM | POA: Diagnosis not present

## 2019-12-25 DIAGNOSIS — M87878 Other osteonecrosis, left toe(s): Secondary | ICD-10-CM | POA: Diagnosis not present

## 2019-12-29 DIAGNOSIS — R269 Unspecified abnormalities of gait and mobility: Secondary | ICD-10-CM | POA: Diagnosis not present

## 2019-12-29 DIAGNOSIS — Z823 Family history of stroke: Secondary | ICD-10-CM | POA: Diagnosis not present

## 2019-12-29 DIAGNOSIS — Z7984 Long term (current) use of oral hypoglycemic drugs: Secondary | ICD-10-CM | POA: Diagnosis not present

## 2019-12-29 DIAGNOSIS — Z008 Encounter for other general examination: Secondary | ICD-10-CM | POA: Diagnosis not present

## 2019-12-29 DIAGNOSIS — G8929 Other chronic pain: Secondary | ICD-10-CM | POA: Diagnosis not present

## 2019-12-29 DIAGNOSIS — M1990 Unspecified osteoarthritis, unspecified site: Secondary | ICD-10-CM | POA: Diagnosis not present

## 2019-12-29 DIAGNOSIS — I10 Essential (primary) hypertension: Secondary | ICD-10-CM | POA: Diagnosis not present

## 2019-12-29 DIAGNOSIS — E785 Hyperlipidemia, unspecified: Secondary | ICD-10-CM | POA: Diagnosis not present

## 2019-12-29 DIAGNOSIS — Z791 Long term (current) use of non-steroidal anti-inflammatories (NSAID): Secondary | ICD-10-CM | POA: Diagnosis not present

## 2019-12-29 DIAGNOSIS — E1140 Type 2 diabetes mellitus with diabetic neuropathy, unspecified: Secondary | ICD-10-CM | POA: Diagnosis not present

## 2019-12-29 DIAGNOSIS — K219 Gastro-esophageal reflux disease without esophagitis: Secondary | ICD-10-CM | POA: Diagnosis not present

## 2019-12-30 DIAGNOSIS — M86172 Other acute osteomyelitis, left ankle and foot: Secondary | ICD-10-CM | POA: Diagnosis not present

## 2020-01-06 DIAGNOSIS — M86172 Other acute osteomyelitis, left ankle and foot: Secondary | ICD-10-CM | POA: Diagnosis not present

## 2020-01-08 ENCOUNTER — Encounter: Payer: Self-pay | Admitting: Physician Assistant

## 2020-01-20 ENCOUNTER — Encounter: Payer: Self-pay | Admitting: Neurology

## 2020-01-20 ENCOUNTER — Ambulatory Visit (INDEPENDENT_AMBULATORY_CARE_PROVIDER_SITE_OTHER): Payer: Medicare HMO | Admitting: Neurology

## 2020-01-20 ENCOUNTER — Ambulatory Visit: Payer: Medicare HMO | Admitting: Neurology

## 2020-01-20 ENCOUNTER — Other Ambulatory Visit: Payer: Self-pay

## 2020-01-20 DIAGNOSIS — Z79899 Other long term (current) drug therapy: Secondary | ICD-10-CM | POA: Diagnosis not present

## 2020-01-20 DIAGNOSIS — G5603 Carpal tunnel syndrome, bilateral upper limbs: Secondary | ICD-10-CM

## 2020-01-20 DIAGNOSIS — M86172 Other acute osteomyelitis, left ankle and foot: Secondary | ICD-10-CM | POA: Diagnosis not present

## 2020-01-20 HISTORY — DX: Carpal tunnel syndrome, bilateral upper limbs: G56.03

## 2020-01-20 NOTE — Progress Notes (Signed)
Please refer to EMG and nerve conduction procedure note.  

## 2020-01-20 NOTE — Progress Notes (Signed)
[place]    Nerve / Sites Muscle Latency Ref. Amplitude Ref. Rel Amp Segments Distance Velocity Ref. Area    ms ms mV mV %  cm m/s m/s mVms  R Median - APB     Wrist APB 8.1 ?4.4 5.4 ?4.0 100 Wrist - APB 7   27.0     Upper arm APB 12.9  5.3  97.7 Upper arm - Wrist 23 49 ?49 27.3  L Median - APB     Wrist APB 9.8 ?4.4 1.6 ?4.0 100 Wrist - APB 7   5.7     Upper arm APB 13.4  2.8  178 Upper arm - Wrist 20 56 ?49 14.3  R Ulnar - ADM     Wrist ADM 3.2 ?3.3 10.0 ?6.0 100 Wrist - ADM 7   33.6     B.Elbow ADM 7.3  8.5  84.3 B.Elbow - Wrist 20 49 ?49 28.7     A.Elbow ADM 10.7  8.0  94.3 A.Elbow - B.Elbow 10 30 ?49 30.3  L Ulnar - ADM     Wrist ADM 3.3 ?3.3 12.3 ?6.0 100 Wrist - ADM 7   35.2     B.Elbow ADM 7.6  10.4  84.4 B.Elbow - Wrist 21 49 ?49 34.6     A.Elbow ADM 9.6  10.2  98.9 A.Elbow - B.Elbow 10 49 ?49 34.8             SNC    Nerve / Sites Rec. Site Peak Lat Ref.  Amp Ref. Segments Distance    ms ms V V  cm  R Radial - Anatomical snuff box (Forearm)     Forearm Wrist 2.9 ?2.9 40 ?15 Forearm - Wrist 10  L Radial - Anatomical snuff box (Forearm)     Forearm Wrist 2.7 ?2.9 18 ?15 Forearm - Wrist 10  R Median - Orthodromic (Dig II, Mid palm)     Dig II Wrist NR ?3.4 NR ?10 Dig II - Wrist 13  L Median - Orthodromic (Dig II, Mid palm)     Dig II Wrist NR ?3.4 NR ?10 Dig II - Wrist 13  R Ulnar - Orthodromic, (Dig V, Mid palm)     Dig V Wrist 3.6 ?3.1 3 ?5 Dig V - Wrist 11  L Ulnar - Orthodromic, (Dig V, Mid palm)     Dig V Wrist 3.4 ?3.1 3 ?5 Dig V - Wrist 66                 F  Wave    Nerve F Lat Ref.   ms ms  R Ulnar - ADM 36.1 ?32.0  L Ulnar - ADM 31.6 ?32.0

## 2020-01-20 NOTE — Procedures (Signed)
     HISTORY:  Teresa Spence is a 69 year old patient with a history of diabetes who noted sudden onset of weakness and numbness of the left hand about 3 to 4 months ago.  Over time, this has gradually improved.  Most of the improvement occurred within 2 hours.  The patient denied any pain in the neck radiating down the arm, she does have some occasional neck pain.  For the most part, the event was painless.  She has noted some numbness in the hands bilaterally since that time.  She has numbness in the feet associated with her diabetes.  NERVE CONDUCTION STUDIES:  Nerve conduction studies were performed on both upper extremities.  The distal motor latencies for the median nerves were significantly prolonged bilaterally with a low motor amplitude on the left and normal motor amplitude on the right.  The distal motor latencies and motor amplitudes for the ulnar nerves were normal bilaterally.  The nerve conduction velocities for the median nerves were normal bilaterally but were slowed across the elbow for the right ulnar nerve, normal for the left ulnar nerve.  The radial sensory latencies were within normal limits bilaterally.  The median sensory latencies were unobtainable bilaterally.  Prolongation of the ulnar sensory latencies were seen bilaterally.  The F-wave latency for the right ulnar nerve was prolonged, normal for the left ulnar nerve.  EMG STUDIES:  EMG study was performed on the left upper extremity:  The first dorsal interosseous muscle reveals 2 to 4 K units with full recruitment. No fibrillations or positive waves were noted. The abductor pollicis brevis muscle reveals 2 to 6 K units with significantly reduced recruitment. No fibrillations or positive waves were noted. The extensor indicis proprius muscle reveals 1 to 3 K units with full recruitment. No fibrillations or positive waves were noted. The pronator teres muscle reveals 2 to 3 K units with full recruitment. No fibrillations  or positive waves were noted. The biceps muscle reveals 1 to 2 K units with full recruitment. No fibrillations or positive waves were noted. The triceps muscle reveals 2 to 4 K units with full recruitment. No fibrillations or positive waves were noted. The anterior deltoid muscle reveals 2 to 3 K units with full recruitment. No fibrillations or positive waves were noted. The cervical paraspinal muscles were tested at 2 levels. No abnormalities of insertional activity were seen at either level tested. There was poor relaxation.   IMPRESSION:  Nerve conduction studies done on both upper extremities shows evidence of bilateral carpal tunnel syndrome of moderate severity on the right and moderate to severe severity on the left.  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  EMG evaluation of the left upper extremity shows findings consistent with carpal tunnel syndrome, otherwise there is no evidence of an overlying cervical radiculopathy.  Jill Alexanders MD 01/20/2020 1:52 PM  [facility] [address] [place], [place] [address]  Phone [phone] Fax [phone]

## 2020-01-25 ENCOUNTER — Encounter: Payer: Self-pay | Admitting: Physician Assistant

## 2020-01-25 NOTE — Progress Notes (Signed)
Teresa Spence,   Your EMG nerve studies show moderate carpal tunnel on right and moderate to severe on the left. It does not look like your symptoms are coming from your neck which is great. Would you like to be referred to surgeon? That would likely be your best option due to severity. You could also consider a procedure Dr. Darene Lamer does in office called hydrodissection of the median nerve that can provide temporary relief of symptoms. Thoughts??

## 2020-01-27 ENCOUNTER — Telehealth (INDEPENDENT_AMBULATORY_CARE_PROVIDER_SITE_OTHER): Payer: Medicare HMO | Admitting: Physician Assistant

## 2020-01-27 NOTE — Progress Notes (Signed)
Canceled due to death in family.

## 2020-02-17 DIAGNOSIS — M7661 Achilles tendinitis, right leg: Secondary | ICD-10-CM | POA: Diagnosis not present

## 2020-02-17 DIAGNOSIS — Z79899 Other long term (current) drug therapy: Secondary | ICD-10-CM | POA: Diagnosis not present

## 2020-02-17 DIAGNOSIS — M86172 Other acute osteomyelitis, left ankle and foot: Secondary | ICD-10-CM | POA: Diagnosis not present

## 2020-02-29 DIAGNOSIS — M7661 Achilles tendinitis, right leg: Secondary | ICD-10-CM | POA: Diagnosis not present

## 2020-02-29 DIAGNOSIS — R2689 Other abnormalities of gait and mobility: Secondary | ICD-10-CM | POA: Diagnosis not present

## 2020-02-29 DIAGNOSIS — M7662 Achilles tendinitis, left leg: Secondary | ICD-10-CM | POA: Diagnosis not present

## 2020-02-29 DIAGNOSIS — R531 Weakness: Secondary | ICD-10-CM | POA: Diagnosis not present

## 2020-03-11 DIAGNOSIS — R531 Weakness: Secondary | ICD-10-CM | POA: Diagnosis not present

## 2020-03-11 DIAGNOSIS — R2689 Other abnormalities of gait and mobility: Secondary | ICD-10-CM | POA: Diagnosis not present

## 2020-03-11 DIAGNOSIS — M7662 Achilles tendinitis, left leg: Secondary | ICD-10-CM | POA: Diagnosis not present

## 2020-03-11 DIAGNOSIS — M7661 Achilles tendinitis, right leg: Secondary | ICD-10-CM | POA: Diagnosis not present

## 2020-03-15 DIAGNOSIS — R2689 Other abnormalities of gait and mobility: Secondary | ICD-10-CM | POA: Diagnosis not present

## 2020-03-15 DIAGNOSIS — M7661 Achilles tendinitis, right leg: Secondary | ICD-10-CM | POA: Diagnosis not present

## 2020-03-15 DIAGNOSIS — M7662 Achilles tendinitis, left leg: Secondary | ICD-10-CM | POA: Diagnosis not present

## 2020-03-15 DIAGNOSIS — R531 Weakness: Secondary | ICD-10-CM | POA: Diagnosis not present

## 2020-03-17 DIAGNOSIS — R2689 Other abnormalities of gait and mobility: Secondary | ICD-10-CM | POA: Diagnosis not present

## 2020-03-17 DIAGNOSIS — M7661 Achilles tendinitis, right leg: Secondary | ICD-10-CM | POA: Diagnosis not present

## 2020-03-17 DIAGNOSIS — M7662 Achilles tendinitis, left leg: Secondary | ICD-10-CM | POA: Diagnosis not present

## 2020-03-17 DIAGNOSIS — R531 Weakness: Secondary | ICD-10-CM | POA: Diagnosis not present

## 2020-03-24 DIAGNOSIS — R2689 Other abnormalities of gait and mobility: Secondary | ICD-10-CM | POA: Diagnosis not present

## 2020-03-24 DIAGNOSIS — M7661 Achilles tendinitis, right leg: Secondary | ICD-10-CM | POA: Diagnosis not present

## 2020-03-24 DIAGNOSIS — M7662 Achilles tendinitis, left leg: Secondary | ICD-10-CM | POA: Diagnosis not present

## 2020-03-24 DIAGNOSIS — R531 Weakness: Secondary | ICD-10-CM | POA: Diagnosis not present

## 2020-03-29 DIAGNOSIS — M7661 Achilles tendinitis, right leg: Secondary | ICD-10-CM | POA: Diagnosis not present

## 2020-03-29 DIAGNOSIS — M7662 Achilles tendinitis, left leg: Secondary | ICD-10-CM | POA: Diagnosis not present

## 2020-03-29 DIAGNOSIS — R2689 Other abnormalities of gait and mobility: Secondary | ICD-10-CM | POA: Diagnosis not present

## 2020-03-29 DIAGNOSIS — R531 Weakness: Secondary | ICD-10-CM | POA: Diagnosis not present

## 2020-03-30 DIAGNOSIS — M25572 Pain in left ankle and joints of left foot: Secondary | ICD-10-CM | POA: Diagnosis not present

## 2020-03-31 DIAGNOSIS — R2689 Other abnormalities of gait and mobility: Secondary | ICD-10-CM | POA: Diagnosis not present

## 2020-03-31 DIAGNOSIS — R531 Weakness: Secondary | ICD-10-CM | POA: Diagnosis not present

## 2020-03-31 DIAGNOSIS — M7661 Achilles tendinitis, right leg: Secondary | ICD-10-CM | POA: Diagnosis not present

## 2020-03-31 DIAGNOSIS — M7662 Achilles tendinitis, left leg: Secondary | ICD-10-CM | POA: Diagnosis not present

## 2020-04-04 ENCOUNTER — Encounter: Payer: Self-pay | Admitting: Physician Assistant

## 2020-04-04 NOTE — Telephone Encounter (Signed)
Teresa Spence, can the no show fee be removed for the pt? Pls advise, thanks.

## 2020-04-07 DIAGNOSIS — R531 Weakness: Secondary | ICD-10-CM | POA: Diagnosis not present

## 2020-04-07 DIAGNOSIS — R2689 Other abnormalities of gait and mobility: Secondary | ICD-10-CM | POA: Diagnosis not present

## 2020-04-07 DIAGNOSIS — M7662 Achilles tendinitis, left leg: Secondary | ICD-10-CM | POA: Diagnosis not present

## 2020-04-07 DIAGNOSIS — M7661 Achilles tendinitis, right leg: Secondary | ICD-10-CM | POA: Diagnosis not present

## 2020-04-12 DIAGNOSIS — Z20822 Contact with and (suspected) exposure to covid-19: Secondary | ICD-10-CM | POA: Diagnosis not present

## 2020-04-18 DIAGNOSIS — R531 Weakness: Secondary | ICD-10-CM | POA: Diagnosis not present

## 2020-04-18 DIAGNOSIS — M7662 Achilles tendinitis, left leg: Secondary | ICD-10-CM | POA: Diagnosis not present

## 2020-04-18 DIAGNOSIS — M7661 Achilles tendinitis, right leg: Secondary | ICD-10-CM | POA: Diagnosis not present

## 2020-04-18 DIAGNOSIS — R2689 Other abnormalities of gait and mobility: Secondary | ICD-10-CM | POA: Diagnosis not present

## 2020-04-20 DIAGNOSIS — R531 Weakness: Secondary | ICD-10-CM | POA: Diagnosis not present

## 2020-04-20 DIAGNOSIS — M7661 Achilles tendinitis, right leg: Secondary | ICD-10-CM | POA: Diagnosis not present

## 2020-04-20 DIAGNOSIS — M7662 Achilles tendinitis, left leg: Secondary | ICD-10-CM | POA: Diagnosis not present

## 2020-04-20 DIAGNOSIS — R2689 Other abnormalities of gait and mobility: Secondary | ICD-10-CM | POA: Diagnosis not present

## 2020-04-27 DIAGNOSIS — R2689 Other abnormalities of gait and mobility: Secondary | ICD-10-CM | POA: Diagnosis not present

## 2020-04-27 DIAGNOSIS — M7662 Achilles tendinitis, left leg: Secondary | ICD-10-CM | POA: Diagnosis not present

## 2020-04-27 DIAGNOSIS — R531 Weakness: Secondary | ICD-10-CM | POA: Diagnosis not present

## 2020-04-27 DIAGNOSIS — M7661 Achilles tendinitis, right leg: Secondary | ICD-10-CM | POA: Diagnosis not present

## 2020-05-02 DIAGNOSIS — M7662 Achilles tendinitis, left leg: Secondary | ICD-10-CM | POA: Diagnosis not present

## 2020-05-02 DIAGNOSIS — M7661 Achilles tendinitis, right leg: Secondary | ICD-10-CM | POA: Diagnosis not present

## 2020-05-02 DIAGNOSIS — R531 Weakness: Secondary | ICD-10-CM | POA: Diagnosis not present

## 2020-05-02 DIAGNOSIS — R2689 Other abnormalities of gait and mobility: Secondary | ICD-10-CM | POA: Diagnosis not present

## 2020-05-04 DIAGNOSIS — M7661 Achilles tendinitis, right leg: Secondary | ICD-10-CM | POA: Diagnosis not present

## 2020-05-04 DIAGNOSIS — R531 Weakness: Secondary | ICD-10-CM | POA: Diagnosis not present

## 2020-05-04 DIAGNOSIS — M7662 Achilles tendinitis, left leg: Secondary | ICD-10-CM | POA: Diagnosis not present

## 2020-05-04 DIAGNOSIS — R2689 Other abnormalities of gait and mobility: Secondary | ICD-10-CM | POA: Diagnosis not present

## 2020-05-09 DIAGNOSIS — M7661 Achilles tendinitis, right leg: Secondary | ICD-10-CM | POA: Diagnosis not present

## 2020-05-09 DIAGNOSIS — R531 Weakness: Secondary | ICD-10-CM | POA: Diagnosis not present

## 2020-05-09 DIAGNOSIS — R2689 Other abnormalities of gait and mobility: Secondary | ICD-10-CM | POA: Diagnosis not present

## 2020-05-09 DIAGNOSIS — M7662 Achilles tendinitis, left leg: Secondary | ICD-10-CM | POA: Diagnosis not present

## 2020-05-11 DIAGNOSIS — R2689 Other abnormalities of gait and mobility: Secondary | ICD-10-CM | POA: Diagnosis not present

## 2020-05-11 DIAGNOSIS — M7662 Achilles tendinitis, left leg: Secondary | ICD-10-CM | POA: Diagnosis not present

## 2020-05-11 DIAGNOSIS — M7661 Achilles tendinitis, right leg: Secondary | ICD-10-CM | POA: Diagnosis not present

## 2020-05-11 DIAGNOSIS — R531 Weakness: Secondary | ICD-10-CM | POA: Diagnosis not present

## 2020-05-17 DIAGNOSIS — R531 Weakness: Secondary | ICD-10-CM | POA: Diagnosis not present

## 2020-05-17 DIAGNOSIS — M7661 Achilles tendinitis, right leg: Secondary | ICD-10-CM | POA: Diagnosis not present

## 2020-05-17 DIAGNOSIS — M7662 Achilles tendinitis, left leg: Secondary | ICD-10-CM | POA: Diagnosis not present

## 2020-05-17 DIAGNOSIS — R2689 Other abnormalities of gait and mobility: Secondary | ICD-10-CM | POA: Diagnosis not present

## 2020-05-19 DIAGNOSIS — R2689 Other abnormalities of gait and mobility: Secondary | ICD-10-CM | POA: Diagnosis not present

## 2020-05-19 DIAGNOSIS — M7662 Achilles tendinitis, left leg: Secondary | ICD-10-CM | POA: Diagnosis not present

## 2020-05-19 DIAGNOSIS — R531 Weakness: Secondary | ICD-10-CM | POA: Diagnosis not present

## 2020-05-19 DIAGNOSIS — M7661 Achilles tendinitis, right leg: Secondary | ICD-10-CM | POA: Diagnosis not present

## 2020-05-23 DIAGNOSIS — M7662 Achilles tendinitis, left leg: Secondary | ICD-10-CM | POA: Diagnosis not present

## 2020-05-23 DIAGNOSIS — R531 Weakness: Secondary | ICD-10-CM | POA: Diagnosis not present

## 2020-05-23 DIAGNOSIS — M7661 Achilles tendinitis, right leg: Secondary | ICD-10-CM | POA: Diagnosis not present

## 2020-05-23 DIAGNOSIS — R2689 Other abnormalities of gait and mobility: Secondary | ICD-10-CM | POA: Diagnosis not present

## 2020-05-25 DIAGNOSIS — R531 Weakness: Secondary | ICD-10-CM | POA: Diagnosis not present

## 2020-05-25 DIAGNOSIS — R2689 Other abnormalities of gait and mobility: Secondary | ICD-10-CM | POA: Diagnosis not present

## 2020-05-25 DIAGNOSIS — M7661 Achilles tendinitis, right leg: Secondary | ICD-10-CM | POA: Diagnosis not present

## 2020-05-25 DIAGNOSIS — M7662 Achilles tendinitis, left leg: Secondary | ICD-10-CM | POA: Diagnosis not present

## 2020-05-26 DIAGNOSIS — R69 Illness, unspecified: Secondary | ICD-10-CM | POA: Diagnosis not present

## 2020-05-30 DIAGNOSIS — M7662 Achilles tendinitis, left leg: Secondary | ICD-10-CM | POA: Diagnosis not present

## 2020-05-30 DIAGNOSIS — R531 Weakness: Secondary | ICD-10-CM | POA: Diagnosis not present

## 2020-05-30 DIAGNOSIS — M7661 Achilles tendinitis, right leg: Secondary | ICD-10-CM | POA: Diagnosis not present

## 2020-05-30 DIAGNOSIS — R2689 Other abnormalities of gait and mobility: Secondary | ICD-10-CM | POA: Diagnosis not present

## 2020-06-01 DIAGNOSIS — M7662 Achilles tendinitis, left leg: Secondary | ICD-10-CM | POA: Diagnosis not present

## 2020-06-01 DIAGNOSIS — R531 Weakness: Secondary | ICD-10-CM | POA: Diagnosis not present

## 2020-06-01 DIAGNOSIS — M7661 Achilles tendinitis, right leg: Secondary | ICD-10-CM | POA: Diagnosis not present

## 2020-06-01 DIAGNOSIS — R2689 Other abnormalities of gait and mobility: Secondary | ICD-10-CM | POA: Diagnosis not present

## 2020-06-02 ENCOUNTER — Telehealth: Payer: Self-pay | Admitting: Physician Assistant

## 2020-06-02 NOTE — Telephone Encounter (Signed)
Can we please forgive the no show fee the day her husband died and she missed her appt?

## 2020-06-02 NOTE — Telephone Encounter (Signed)
mychart message

## 2020-06-06 NOTE — Telephone Encounter (Signed)
Do you think we can get the no show fee forgiven since her husband died that day?

## 2020-06-15 DIAGNOSIS — R2689 Other abnormalities of gait and mobility: Secondary | ICD-10-CM | POA: Diagnosis not present

## 2020-06-15 DIAGNOSIS — M7662 Achilles tendinitis, left leg: Secondary | ICD-10-CM | POA: Diagnosis not present

## 2020-06-15 DIAGNOSIS — R531 Weakness: Secondary | ICD-10-CM | POA: Diagnosis not present

## 2020-06-15 DIAGNOSIS — M7661 Achilles tendinitis, right leg: Secondary | ICD-10-CM | POA: Diagnosis not present

## 2020-06-20 DIAGNOSIS — R2689 Other abnormalities of gait and mobility: Secondary | ICD-10-CM | POA: Diagnosis not present

## 2020-06-20 DIAGNOSIS — M7662 Achilles tendinitis, left leg: Secondary | ICD-10-CM | POA: Diagnosis not present

## 2020-06-20 DIAGNOSIS — M7661 Achilles tendinitis, right leg: Secondary | ICD-10-CM | POA: Diagnosis not present

## 2020-06-20 DIAGNOSIS — R531 Weakness: Secondary | ICD-10-CM | POA: Diagnosis not present

## 2020-07-05 DIAGNOSIS — R2689 Other abnormalities of gait and mobility: Secondary | ICD-10-CM | POA: Diagnosis not present

## 2020-07-05 DIAGNOSIS — M7662 Achilles tendinitis, left leg: Secondary | ICD-10-CM | POA: Diagnosis not present

## 2020-07-05 DIAGNOSIS — M7661 Achilles tendinitis, right leg: Secondary | ICD-10-CM | POA: Diagnosis not present

## 2020-07-05 DIAGNOSIS — R531 Weakness: Secondary | ICD-10-CM | POA: Diagnosis not present

## 2020-07-08 DIAGNOSIS — R531 Weakness: Secondary | ICD-10-CM | POA: Diagnosis not present

## 2020-07-08 DIAGNOSIS — R2689 Other abnormalities of gait and mobility: Secondary | ICD-10-CM | POA: Diagnosis not present

## 2020-07-08 DIAGNOSIS — M7662 Achilles tendinitis, left leg: Secondary | ICD-10-CM | POA: Diagnosis not present

## 2020-07-08 DIAGNOSIS — M7661 Achilles tendinitis, right leg: Secondary | ICD-10-CM | POA: Diagnosis not present

## 2020-07-11 DIAGNOSIS — M7662 Achilles tendinitis, left leg: Secondary | ICD-10-CM | POA: Diagnosis not present

## 2020-07-11 DIAGNOSIS — R2689 Other abnormalities of gait and mobility: Secondary | ICD-10-CM | POA: Diagnosis not present

## 2020-07-11 DIAGNOSIS — M7661 Achilles tendinitis, right leg: Secondary | ICD-10-CM | POA: Diagnosis not present

## 2020-07-11 DIAGNOSIS — R531 Weakness: Secondary | ICD-10-CM | POA: Diagnosis not present

## 2020-07-13 DIAGNOSIS — M7662 Achilles tendinitis, left leg: Secondary | ICD-10-CM | POA: Diagnosis not present

## 2020-07-13 DIAGNOSIS — R531 Weakness: Secondary | ICD-10-CM | POA: Diagnosis not present

## 2020-07-13 DIAGNOSIS — M7661 Achilles tendinitis, right leg: Secondary | ICD-10-CM | POA: Diagnosis not present

## 2020-07-13 DIAGNOSIS — R2689 Other abnormalities of gait and mobility: Secondary | ICD-10-CM | POA: Diagnosis not present

## 2020-08-23 ENCOUNTER — Other Ambulatory Visit: Payer: Self-pay

## 2020-08-23 ENCOUNTER — Ambulatory Visit (INDEPENDENT_AMBULATORY_CARE_PROVIDER_SITE_OTHER): Payer: Medicare HMO | Admitting: Physician Assistant

## 2020-08-23 ENCOUNTER — Ambulatory Visit (INDEPENDENT_AMBULATORY_CARE_PROVIDER_SITE_OTHER): Payer: Medicare HMO

## 2020-08-23 VITALS — BP 128/62 | HR 76 | Ht 66.5 in | Wt 189.0 lb

## 2020-08-23 DIAGNOSIS — E119 Type 2 diabetes mellitus without complications: Secondary | ICD-10-CM

## 2020-08-23 DIAGNOSIS — L97523 Non-pressure chronic ulcer of other part of left foot with necrosis of muscle: Secondary | ICD-10-CM | POA: Diagnosis not present

## 2020-08-23 DIAGNOSIS — M19072 Primary osteoarthritis, left ankle and foot: Secondary | ICD-10-CM | POA: Diagnosis not present

## 2020-08-23 DIAGNOSIS — S91109S Unspecified open wound of unspecified toe(s) without damage to nail, sequela: Secondary | ICD-10-CM | POA: Insufficient documentation

## 2020-08-23 DIAGNOSIS — L97529 Non-pressure chronic ulcer of other part of left foot with unspecified severity: Secondary | ICD-10-CM | POA: Diagnosis not present

## 2020-08-23 DIAGNOSIS — E785 Hyperlipidemia, unspecified: Secondary | ICD-10-CM | POA: Diagnosis not present

## 2020-08-23 DIAGNOSIS — Z79899 Other long term (current) drug therapy: Secondary | ICD-10-CM | POA: Diagnosis not present

## 2020-08-23 LAB — POCT GLYCOSYLATED HEMOGLOBIN (HGB A1C): Hemoglobin A1C: 7.1 % — AB (ref 4.0–5.6)

## 2020-08-23 NOTE — Progress Notes (Signed)
Subjective:    Patient ID: Teresa Spence, female    DOB: 10-05-1950, 69 y.o.   MRN: [id number]  HPI  Pt is a 69 yo obese female with T2DM, HTN, HLD who presents to the clinic for follow up. She has not been seen since April. She has had a very hard year. Her husband died from covid in Jan 31, 2023.   She has had multiple problems and complications since her bunion surgery in Feb 2021. Her left foot 4th toe became infected and had to have a surgery to remove some bone from it. It is fine but disfigured. She noticed a few months ago a spot on 2nd distal left toe. It has continue to grow and now has a black appearance in the middle. It is tender but not painful. It does not drain. No fever, chills.   Taking metformin. Not checking sugars. Denies any hypoglycemia.    .. Active Ambulatory Problems    Diagnosis Date Noted  . Essential hypertension 12/27/2016  . Type 2 diabetes mellitus without complication, without long-term current use of insulin ([place]) 12/27/2016  . Palpitations 12/27/2016  . Cellulitis of toe of left foot 12/29/2016  . 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 Absent pedal pulses 12/29/2016  . Choking 05/08/2017  . Numbness and tingling in both hands 05/08/2017  . Primary osteoarthritis involving multiple joints 05/08/2017  . Toenail fungus 10/25/2017  . Class 1 obesity due to excess calories with serious comorbidity and body mass index (BMI) of 30.0 to 30.9 in adult 11/05/2017  . Hyperlipidemia associated with type 2 diabetes mellitus ([place]) 05/06/2018  . Seborrheic keratoses 05/06/2018  . Dermoid cyst of right ear 05/06/2018  . Chest tightness 05/06/2018  . Colon polyps 05/21/2018  . Generalized osteoarthritis of hand 09/11/2018  . Neuropathy 06/03/2019  . Post-menopausal 06/03/2019  . Tinnitus, left 09/14/2019  . ETD (Eustachian tube dysfunction), bilateral 09/14/2019  . DDD (degenerative disc disease), cervical 12/01/2019  . Fine motor skill loss 12/01/2019  . Bilateral carpal tunnel  syndrome 01/20/2020  . Open toe wound, sequela 08/23/2020  . Hyperlipidemia LDL goal <70 08/23/2020  . Toe ulcer, left, with necrosis of muscle ([place]) 08/24/2020   Resolved Ambulatory Problems    Diagnosis Date Noted  . No Resolved Ambulatory Problems   Past Medical History:  Diagnosis Date  . Diabetes mellitus without complication ([place])   . Hypertension       Review of Systems  All other systems reviewed and are negative.      Objective:   Physical Exam Vitals reviewed.  Constitutional:      Appearance: Normal appearance.  HENT:     Head: Normocephalic.  Cardiovascular:     Rate and Rhythm: Normal rate and regular rhythm.     Pulses: Normal pulses.  Pulmonary:     Effort: Pulmonary effort is normal.     Breath sounds: Normal breath sounds.  Musculoskeletal:     Right lower leg: No edema.     Left lower leg: No edema.     Comments: Left:  2nd toe at tip 2cm by 2.5cm of dead keratoic tissue with central ulcer with appearance of necrosis. No erythema, warmth, swelling of toe or foot.    Skin:    Comments: Good pedal pulses 2+.  No edema in feet.   Neurological:     General: No focal deficit present.     Mental Status: She is alert and oriented to person, place, and time.  Psychiatric:  Mood and Affect: Mood normal.           Assessment & Plan:  [name] KitchenMarland KitchenCharnay was seen today for foot injury.  Diagnoses and all orders for this visit:  Type 2 diabetes mellitus without complication, without long-term current use of insulin (HCC) -     POCT glycosylated hemoglobin (Hb A1C) -     Cancel: DG Toe 2nd Right -     DG Toe 2nd Left  Hyperlipidemia LDL goal <70 -     Lipid Panel w/reflex Direct LDL  Medication management -     Lipid Panel w/reflex Direct LDL -     COMPLETE METABOLIC PANEL WITH GFR  Open toe wound, sequela  Toe ulcer, left, with necrosis of muscle (HCC)    Lab Results  Component Value Date   HGBA1C 7.1 (A) 08/23/2020    Not controlled  A1C.  On metformin.  Discussed addition of GLP-1 or SGLT-2. Pt declined for now stating she wanted to work on her diet first.  Pt on statin.  BP to goal.  .. Diabetic Foot Exam - Simple   Simple Foot Form Diabetic Foot exam was performed with the following findings: Yes 08/23/2020  9:47 AM  Visual Inspection See comments: Yes Sensation Testing See comments: Yes Pulse Check Posterior Tibialis and Dorsalis pulse intact bilaterally: Yes Comments Patient has deformities in toes from past surgeries. Issues with monofilament testing in most areas except some toes and heels.     Eye exam needs to schedule.  Covid, pneumonia flu UTD.  Follow up in 3 months.   Concerns about left foot. 2nd toe is ulcered with dead tissue formation and appearance of necrosis underneath. Needs podiatry. Has appt 25th. Will call to see if can move sooner. Xray ordered to look for any signs of osteomyelitis. Reassuring no systemic symptoms or redness/warmth/discharge. Discussed rolled up gauze and placed under 2nd toe to keep it from rubbing.

## 2020-08-23 NOTE — Progress Notes (Signed)
Great news. No evidence of osteomyelitis.

## 2020-08-23 NOTE — Patient Instructions (Signed)
Trulicity ozempic

## 2020-08-24 ENCOUNTER — Telehealth: Payer: Self-pay | Admitting: Physician Assistant

## 2020-08-24 ENCOUNTER — Encounter: Payer: Self-pay | Admitting: Physician Assistant

## 2020-08-24 DIAGNOSIS — L97523 Non-pressure chronic ulcer of other part of left foot with necrosis of muscle: Secondary | ICD-10-CM | POA: Insufficient documentation

## 2020-08-24 LAB — LIPID PANEL W/REFLEX DIRECT LDL
Cholesterol: 112 mg/dL (ref <200)
HDL: 36 mg/dL — ABNORMAL LOW (ref >=50)
LDL Cholesterol (Calc): 50 mg/dL (calc)
Non-HDL Cholesterol (Calc): 76 mg/dL (calc) (ref <130)
Total CHOL/HDL Ratio: 3.1 (calc) (ref <5.0)
Triglycerides: 191 mg/dL — ABNORMAL HIGH (ref <150)

## 2020-08-24 LAB — COMPLETE METABOLIC PANEL WITH GFR
AG Ratio: 1.7 (calc) (ref 1.0–2.5)
ALT: 15 U/L (ref 6–29)
AST: 19 U/L (ref 10–35)
Albumin: 4.4 g/dL (ref 3.6–5.1)
Alkaline phosphatase (APISO): 63 U/L (ref 37–153)
BUN: 20 mg/dL (ref 7–25)
CO2: 28 mmol/L (ref 20–32)
Calcium: 9.8 mg/dL (ref 8.6–10.4)
Chloride: 101 mmol/L (ref 98–110)
Creat: 0.86 mg/dL (ref 0.60–0.93)
GFR, Est African American: 79 mL/min/{1.73_m2} (ref >=60)
GFR, Est Non African American: 68 mL/min/{1.73_m2} (ref >=60)
Globulin: 2.6 g/dL (calc) (ref 1.9–3.7)
Glucose, Bld: 136 mg/dL — ABNORMAL HIGH (ref 65–99)
Potassium: 4.7 mmol/L (ref 3.5–5.3)
Sodium: 139 mmol/L (ref 135–146)
Total Bilirubin: 0.7 mg/dL (ref 0.2–1.2)
Total Protein: 7 g/dL (ref 6.1–8.1)

## 2020-08-24 NOTE — Telephone Encounter (Signed)
Hey Teresa Spence,   Can we please look at billing for this patient. She was a charged a no show fee on Feb 26, 2023 the day her husband died from covid. Can we please take it away.

## 2020-08-24 NOTE — Progress Notes (Signed)
[place],   LDL to goal.  HDL a little low. Being more active could help with this.  Kidney and liver look great.

## 2020-08-27 ENCOUNTER — Other Ambulatory Visit: Payer: Self-pay | Admitting: Physician Assistant

## 2020-08-27 DIAGNOSIS — E1169 Type 2 diabetes mellitus with other specified complication: Secondary | ICD-10-CM

## 2020-08-27 DIAGNOSIS — I10 Essential (primary) hypertension: Secondary | ICD-10-CM

## 2020-08-27 DIAGNOSIS — E785 Hyperlipidemia, unspecified: Secondary | ICD-10-CM

## 2020-08-29 ENCOUNTER — Telehealth: Payer: Self-pay | Admitting: Neurology

## 2020-08-29 NOTE — Telephone Encounter (Signed)
They said they were going to call her right, Teresa Spence?

## 2020-08-29 NOTE — Telephone Encounter (Signed)
Patient left vm stating Dr.Sheard's office hasn't heard about moving her appt sooner (to evaluate toe). Who is supposed to be calling to get this done? Does she need a new referral?

## 2020-08-30 DIAGNOSIS — H02882 Meibomian gland dysfunction right lower eyelid: Secondary | ICD-10-CM | POA: Diagnosis not present

## 2020-08-30 DIAGNOSIS — H5210 Myopia, unspecified eye: Secondary | ICD-10-CM | POA: Diagnosis not present

## 2020-08-30 DIAGNOSIS — Z135 Encounter for screening for eye and ear disorders: Secondary | ICD-10-CM | POA: Diagnosis not present

## 2020-08-30 DIAGNOSIS — E119 Type 2 diabetes mellitus without complications: Secondary | ICD-10-CM | POA: Diagnosis not present

## 2020-08-30 DIAGNOSIS — H02885 Meibomian gland dysfunction left lower eyelid: Secondary | ICD-10-CM | POA: Diagnosis not present

## 2020-08-30 DIAGNOSIS — Z0100 Encounter for examination of eyes and vision without abnormal findings: Secondary | ICD-10-CM | POA: Diagnosis not present

## 2020-08-30 DIAGNOSIS — H2513 Age-related nuclear cataract, bilateral: Secondary | ICD-10-CM | POA: Diagnosis not present

## 2020-08-30 LAB — HM DIABETES EYE EXAM

## 2020-08-30 NOTE — Telephone Encounter (Signed)
I spoke with Dr Moise Boring office that is where the patient is scheduled and yes they stated they would call the patient. I will try to call them again today to see what is going on. - CF

## 2020-08-30 NOTE — Telephone Encounter (Signed)
Thanks either way just let the patient know we have reached out to them.

## 2020-09-10 ENCOUNTER — Encounter: Payer: Self-pay | Admitting: Physician Assistant

## 2020-09-12 ENCOUNTER — Other Ambulatory Visit: Payer: Self-pay | Admitting: Physician Assistant

## 2020-09-12 ENCOUNTER — Encounter: Payer: Self-pay | Admitting: Physician Assistant

## 2020-09-12 DIAGNOSIS — E119 Type 2 diabetes mellitus without complications: Secondary | ICD-10-CM

## 2020-09-12 DIAGNOSIS — L97523 Non-pressure chronic ulcer of other part of left foot with necrosis of muscle: Secondary | ICD-10-CM

## 2020-09-12 NOTE — Progress Notes (Signed)
Pt needed referral she already had appt but not able to get in.

## 2020-09-12 NOTE — Telephone Encounter (Signed)
I called this office to get patient moved up they stated they were going to call patient with an appointment if they could get her in sooner. I am going to refax the referral to their office. - CF

## 2020-09-13 DIAGNOSIS — M25572 Pain in left ankle and joints of left foot: Secondary | ICD-10-CM | POA: Diagnosis not present

## 2020-09-13 DIAGNOSIS — M2042 Other hammer toe(s) (acquired), left foot: Secondary | ICD-10-CM | POA: Diagnosis not present

## 2020-09-13 DIAGNOSIS — L851 Acquired keratosis [keratoderma] palmaris et plantaris: Secondary | ICD-10-CM | POA: Diagnosis not present

## 2020-09-26 ENCOUNTER — Encounter: Payer: Self-pay | Admitting: Physician Assistant

## 2020-10-10 DIAGNOSIS — E119 Type 2 diabetes mellitus without complications: Secondary | ICD-10-CM | POA: Diagnosis not present

## 2020-10-10 DIAGNOSIS — M21612 Bunion of left foot: Secondary | ICD-10-CM | POA: Diagnosis not present

## 2020-10-13 DIAGNOSIS — E119 Type 2 diabetes mellitus without complications: Secondary | ICD-10-CM | POA: Diagnosis not present

## 2020-10-13 DIAGNOSIS — M21612 Bunion of left foot: Secondary | ICD-10-CM | POA: Diagnosis not present

## 2020-10-17 DIAGNOSIS — M21612 Bunion of left foot: Secondary | ICD-10-CM | POA: Diagnosis not present

## 2020-10-17 DIAGNOSIS — E119 Type 2 diabetes mellitus without complications: Secondary | ICD-10-CM | POA: Diagnosis not present

## 2020-10-20 DIAGNOSIS — M21612 Bunion of left foot: Secondary | ICD-10-CM | POA: Diagnosis not present

## 2020-10-20 DIAGNOSIS — E119 Type 2 diabetes mellitus without complications: Secondary | ICD-10-CM | POA: Diagnosis not present

## 2020-10-25 DIAGNOSIS — M21612 Bunion of left foot: Secondary | ICD-10-CM | POA: Diagnosis not present

## 2020-10-25 DIAGNOSIS — E119 Type 2 diabetes mellitus without complications: Secondary | ICD-10-CM | POA: Diagnosis not present

## 2020-10-28 DIAGNOSIS — M21612 Bunion of left foot: Secondary | ICD-10-CM | POA: Diagnosis not present

## 2020-10-28 DIAGNOSIS — E119 Type 2 diabetes mellitus without complications: Secondary | ICD-10-CM | POA: Diagnosis not present

## 2020-11-01 DIAGNOSIS — E119 Type 2 diabetes mellitus without complications: Secondary | ICD-10-CM | POA: Diagnosis not present

## 2020-11-01 DIAGNOSIS — M21612 Bunion of left foot: Secondary | ICD-10-CM | POA: Diagnosis not present

## 2020-11-04 DIAGNOSIS — E119 Type 2 diabetes mellitus without complications: Secondary | ICD-10-CM | POA: Diagnosis not present

## 2020-11-04 DIAGNOSIS — M21612 Bunion of left foot: Secondary | ICD-10-CM | POA: Diagnosis not present

## 2020-11-10 DIAGNOSIS — M21612 Bunion of left foot: Secondary | ICD-10-CM | POA: Diagnosis not present

## 2020-11-10 DIAGNOSIS — E119 Type 2 diabetes mellitus without complications: Secondary | ICD-10-CM | POA: Diagnosis not present

## 2020-11-14 ENCOUNTER — Other Ambulatory Visit: Payer: Self-pay | Admitting: Physician Assistant

## 2020-11-14 DIAGNOSIS — I10 Essential (primary) hypertension: Secondary | ICD-10-CM

## 2020-11-14 DIAGNOSIS — Z01812 Encounter for preprocedural laboratory examination: Secondary | ICD-10-CM | POA: Diagnosis not present

## 2020-11-14 DIAGNOSIS — Z20822 Contact with and (suspected) exposure to covid-19: Secondary | ICD-10-CM | POA: Diagnosis not present

## 2020-11-17 DIAGNOSIS — M2042 Other hammer toe(s) (acquired), left foot: Secondary | ICD-10-CM | POA: Diagnosis not present

## 2020-11-17 DIAGNOSIS — M79672 Pain in left foot: Secondary | ICD-10-CM | POA: Diagnosis not present

## 2020-11-17 DIAGNOSIS — L84 Corns and callosities: Secondary | ICD-10-CM | POA: Diagnosis not present

## 2020-11-17 DIAGNOSIS — E119 Type 2 diabetes mellitus without complications: Secondary | ICD-10-CM | POA: Diagnosis not present

## 2020-11-17 DIAGNOSIS — I10 Essential (primary) hypertension: Secondary | ICD-10-CM | POA: Diagnosis not present

## 2020-11-17 DIAGNOSIS — L851 Acquired keratosis [keratoderma] palmaris et plantaris: Secondary | ICD-10-CM | POA: Diagnosis not present

## 2020-11-21 DIAGNOSIS — E785 Hyperlipidemia, unspecified: Secondary | ICD-10-CM | POA: Diagnosis not present

## 2020-11-21 DIAGNOSIS — R52 Pain, unspecified: Secondary | ICD-10-CM | POA: Diagnosis not present

## 2020-11-21 DIAGNOSIS — I10 Essential (primary) hypertension: Secondary | ICD-10-CM | POA: Diagnosis not present

## 2020-11-21 DIAGNOSIS — M1990 Unspecified osteoarthritis, unspecified site: Secondary | ICD-10-CM | POA: Diagnosis not present

## 2020-11-21 DIAGNOSIS — Z7722 Contact with and (suspected) exposure to environmental tobacco smoke (acute) (chronic): Secondary | ICD-10-CM | POA: Diagnosis not present

## 2020-11-21 DIAGNOSIS — R269 Unspecified abnormalities of gait and mobility: Secondary | ICD-10-CM | POA: Diagnosis not present

## 2020-11-21 DIAGNOSIS — Z6830 Body mass index (BMI) 30.0-30.9, adult: Secondary | ICD-10-CM | POA: Diagnosis not present

## 2020-11-21 DIAGNOSIS — E119 Type 2 diabetes mellitus without complications: Secondary | ICD-10-CM | POA: Diagnosis not present

## 2020-11-21 DIAGNOSIS — G8918 Other acute postprocedural pain: Secondary | ICD-10-CM | POA: Diagnosis not present

## 2020-11-21 DIAGNOSIS — Z7984 Long term (current) use of oral hypoglycemic drugs: Secondary | ICD-10-CM | POA: Diagnosis not present

## 2020-11-21 DIAGNOSIS — E669 Obesity, unspecified: Secondary | ICD-10-CM | POA: Diagnosis not present

## 2020-11-30 ENCOUNTER — Other Ambulatory Visit: Payer: Self-pay | Admitting: Physician Assistant

## 2020-11-30 DIAGNOSIS — I10 Essential (primary) hypertension: Secondary | ICD-10-CM

## 2021-02-11 ENCOUNTER — Other Ambulatory Visit: Payer: Self-pay | Admitting: Physician Assistant

## 2021-02-11 DIAGNOSIS — E119 Type 2 diabetes mellitus without complications: Secondary | ICD-10-CM

## 2021-02-21 ENCOUNTER — Other Ambulatory Visit: Payer: Self-pay

## 2021-02-21 ENCOUNTER — Emergency Department
Admission: EM | Admit: 2021-02-21 | Discharge: 2021-02-21 | Disposition: A | Discharge status: Home / Self Care | Payer: Medicare HMO | Source: Home / Self Care

## 2021-02-21 DIAGNOSIS — B9789 Other viral agents as the cause of diseases classified elsewhere: Secondary | ICD-10-CM | POA: Diagnosis not present

## 2021-02-21 DIAGNOSIS — J302 Other seasonal allergic rhinitis: Secondary | ICD-10-CM

## 2021-02-21 DIAGNOSIS — J019 Acute sinusitis, unspecified: Secondary | ICD-10-CM

## 2021-02-21 MED ORDER — PREDNISONE 50 MG PO TABS: ORAL_TABLET | ORAL | 0 refills | Status: DC

## 2021-02-21 NOTE — ED Triage Notes (Signed)
Pt presents to Urgent Care with c/o sore throat, cough, nasal congestion, and L facial/upper teeth pain x 4 days. Reports negative home COVID test today; pt vaccinated.

## 2021-02-21 NOTE — Discharge Instructions (Addendum)
Use Flonase tonight after doing the Netie pot rinses.   Watch this video who to do saline nose rinses.  [email or web address]  Do not use tap water, only boiled water that has been cooled down.   Do it twice a day  for 5-7 days, but avoid bed time.    If you develop a fever of 100.5 or more then you need to be seen

## 2021-02-21 NOTE — ED Provider Notes (Signed)
[facility]    CSN: [id number] Arrival date & time: 02/21/21  1903      History   Chief Complaint Chief Complaint  Patient presents with   Cough   Nasal Congestion   Facial Pain    HPI Darrell Leonhardt is a 70 y.o. female who presents with onset of ST, cough, nose congestion and L face pain when she bends over x 4 days.  Has not had a fever. Has hx of frequent infections and is triggered by allergies. Was outside for one week last week, and been drying her air with her car top down.      Past Medical History:  Diagnosis Date   Bilateral carpal tunnel syndrome 01/20/2020   Diabetes mellitus without complication ([place])    Hypertension     Patient Active Problem List   Diagnosis Date Noted   Toe ulcer, left, with necrosis of muscle ([place]) 08/24/2020   Open toe wound, sequela 08/23/2020   Hyperlipidemia LDL goal <70 08/23/2020   Bilateral carpal tunnel syndrome 01/20/2020   DDD (degenerative disc disease), cervical 12/01/2019   Fine motor skill loss 12/01/2019   Tinnitus, left 09/14/2019   ETD (Eustachian tube dysfunction), bilateral 09/14/2019   Neuropathy 06/03/2019   Post-menopausal 06/03/2019   Generalized osteoarthritis of hand 09/11/2018   Colon polyps 05/21/2018   Hyperlipidemia associated with type 2 diabetes mellitus ([place]) 05/06/2018   Seborrheic keratoses 05/06/2018   Dermoid cyst of right ear 05/06/2018   Chest tightness 05/06/2018   Class 1 obesity due to excess calories with serious comorbidity and body mass index (BMI) of 30.0 to 30.9 in adult 11/05/2017   Toenail fungus 10/25/2017   Choking 05/08/2017   Numbness and tingling in both hands 05/08/2017   Primary osteoarthritis involving multiple joints 05/08/2017   Cellulitis of toe of left foot 12/29/2016   Blue toes 12/29/2016   Absent pedal pulses 12/29/2016   Essential hypertension 12/27/2016   Type 2 diabetes mellitus without complication, without long-term current use of insulin ([place])  12/27/2016   Palpitations 12/27/2016    Past Surgical History:  Procedure Laterality Date   BUNIONECTOMY Right 06/10/2019   HAMMER TOE SURGERY     MEDIAL PARTIAL KNEE REPLACEMENT     TONSILLECTOMY      OB History   No obstetric history on file.      Home Medications    Prior to Admission medications   Medication Sig Start Date End Date Taking? Authorizing Provider  predniSONE (DELTASONE) 50 MG tablet One qd 02/21/21  Yes Rodriguez-Southworth, Sunday Spillers, PA-C  amLODipine (NORVASC) 2.5 MG tablet Take 1 tablet (2.5 mg total) by mouth daily. Needs appt 11/30/20   Donella Stade, PA-C  Ascorbic Acid (VITAMIN C) 100 MG tablet Take 100 mg by mouth daily.    [provider]  atorvastatin (LIPITOR) 40 MG tablet TAKE ONE TABLET BY MOUTH DAILY 08/29/20   Breeback, [name] L, PA-C  cholecalciferol (VITAMIN D3) 25 MCG (1000 UT) tablet Take 1,000 Units by mouth daily.    [provider]  diazepam (VALIUM) 5 MG tablet Take 2 tab PO 1 hours before procedure or imaging. 11/30/19   Breeback, Royetta Car, PA-C  losartan-hydrochlorothiazide (HYZAAR) 100-25 MG tablet TAKE ONE TABLET BY MOUTH DAILY 08/29/20   Breeback, [name] L, PA-C  magnesium 30 MG tablet Take 30 mg by mouth 2 (two) times daily.    [provider]  metFORMIN (GLUCOPHAGE) 1000 MG tablet TAKE ONE TABLET BY MOUTH TWICE A DAY WITH  MEALS/ NEEDS APPT 02/13/21   Donella Stade, PA-C    Family History Family History  Problem Relation Age of Onset   Diabetes Mother    Hypertension Mother    Hypertension Father    COPD Father    Heart attack Brother    Stroke Maternal Grandmother    Heart attack Maternal Grandfather    Emphysema Neg Hx     Social History Social History   Tobacco Use   Smoking status: Former    Pack years: 0.00   Smokeless tobacco: Never  Vaping Use   Vaping Use: Never used  Substance Use Topics   Alcohol use: Yes    Alcohol/week: 1.0 standard drink    Types: 1 Glasses of wine per week     Comment: 1 glass a month   Drug use: No     Allergies   Lamisil [terbinafine]   Review of Systems Review of Systems  Constitutional:  Negative for chills, diaphoresis and fever.  HENT:  Positive for congestion, postnasal drip, rhinorrhea, sinus pressure and sinus pain. Negative for dental problem, ear discharge, ear pain, sore throat and trouble swallowing.   Eyes:  Negative for discharge.  Respiratory:  Positive for cough.   Musculoskeletal:  Negative for myalgias.  Skin:  Negative for rash.  Allergic/Immunologic: Positive for environmental allergies.  Neurological:  Positive for headaches.  Hematological:  Negative for adenopathy.    Physical Exam Triage Vital Signs ED Triage Vitals  Enc Vitals Group     BP 02/21/21 1929 (!) 162/65     Pulse Rate 02/21/21 1929 86     Resp 02/21/21 1929 20     Temp 02/21/21 1929 99.2 F (37.3 C)     Temp Source 02/21/21 1929 Oral     SpO2 02/21/21 1929 97 %     Weight 02/21/21 1923 195 lb (88.5 kg)     Height 02/21/21 1923 5' 6.5" (1.689 m)     Head Circumference --      Peak Flow --      Pain Score 02/21/21 1923 7     Pain Loc --      Pain Edu? --      Excl. in [place]? --    No data found.  Updated Vital Signs BP (!) 162/65 (BP Location: Right Arm)   Pulse 86   Temp 99.2 F (37.3 C) (Oral)   Resp 20   Ht 5' 6.5" (1.689 m)   Wt 195 lb (88.5 kg)   SpO2 97%   BMI 31.00 kg/m   Visual Acuity Right Eye Distance:   Left Eye Distance:   Bilateral Distance:    Right Eye Near:   Left Eye Near:    Bilateral Near:     Physical Exam Vitals and nursing note reviewed.  Constitutional:      General: She is not in acute distress.    Appearance: She is not toxic-appearing.  HENT:     Right Ear: Tympanic membrane, ear canal and external ear normal.     Left Ear: Tympanic membrane, ear canal and external ear normal.     Nose: Congestion present.     Comments: Her maxillary sinuses are tender, but all with good translumination     Mouth/Throat:     Mouth: Mucous membranes are moist.     Pharynx: Oropharynx is clear.  Eyes:     General: No scleral icterus.    Conjunctiva/sclera: Conjunctivae normal.  Pulmonary:     Effort:  Pulmonary effort is normal.     Breath sounds: Normal breath sounds. No rhonchi.  Musculoskeletal:        General: Normal range of motion.     Cervical back: Neck supple.  Skin:    General: Skin is warm and dry.     Findings: No rash.  Neurological:     Mental Status: She is alert and oriented to person, place, and time.     Gait: Gait normal.  Psychiatric:        Mood and Affect: Mood normal.        Behavior: Behavior normal.        Thought Content: Thought content normal.        Judgment: Judgment normal.     UC Treatments / Results  Labs (all labs ordered are listed, but only abnormal results are displayed) Labs Reviewed - No data to display  EKG   Radiology No results found.  Procedures Procedures (including critical care time)  Medications Ordered in UC Medications - No data to display  Initial Impression / Assessment and Plan / UC Course  I have reviewed the triage vital signs and the nursing notes. Viral sinusitis Allergic rhinitis I educated her about the difference between bacterial and viral sinusitis.  I dont believe she has bacterial sinusitis.    Final Clinical Impressions(s) / UC Diagnoses   Final diagnoses:  Seasonal allergies  Acute viral sinusitis     Discharge Instructions      Use Flonase tonight after doing the Netie pot rinses.   Watch this video who to do saline nose rinses.  [email or web address]  Do not use tap water, only boiled water that has been cooled down.   Do it twice a day  for 5-7 days, but avoid bed time.    If you develop a fever of 100.5 or more then you need to be seen      ED Prescriptions      Medication Sig Dispense Auth. Provider   predniSONE (DELTASONE) 50 MG tablet One qd 5 tablet Rodriguez-Southworth, Sunday Spillers, PA-C      PDMP not reviewed this encounter.   Shelby Mattocks, Hershal Coria 02/21/21 2031

## 2021-03-15 ENCOUNTER — Other Ambulatory Visit: Payer: Self-pay | Admitting: Physician Assistant

## 2021-03-15 DIAGNOSIS — E119 Type 2 diabetes mellitus without complications: Secondary | ICD-10-CM

## 2021-04-03 ENCOUNTER — Other Ambulatory Visit: Payer: Self-pay | Admitting: Physician Assistant

## 2021-04-03 DIAGNOSIS — E119 Type 2 diabetes mellitus without complications: Secondary | ICD-10-CM

## 2021-04-06 ENCOUNTER — Other Ambulatory Visit: Payer: Self-pay | Admitting: Physician Assistant

## 2021-04-06 DIAGNOSIS — E119 Type 2 diabetes mellitus without complications: Secondary | ICD-10-CM

## 2021-05-05 ENCOUNTER — Encounter: Payer: Medicare HMO | Admitting: Physician Assistant

## 2021-05-26 ENCOUNTER — Encounter: Payer: Self-pay | Admitting: Physician Assistant

## 2021-05-26 ENCOUNTER — Ambulatory Visit (INDEPENDENT_AMBULATORY_CARE_PROVIDER_SITE_OTHER): Payer: Medicare HMO | Admitting: Physician Assistant

## 2021-05-26 ENCOUNTER — Other Ambulatory Visit: Payer: Self-pay

## 2021-05-26 VITALS — BP 131/72 | HR 82 | Temp 98.6°F | Ht 65.5 in | Wt 186.7 lb

## 2021-05-26 DIAGNOSIS — Z13220 Encounter for screening for lipoid disorders: Secondary | ICD-10-CM | POA: Diagnosis not present

## 2021-05-26 DIAGNOSIS — N183 Chronic kidney disease, stage 3 unspecified: Secondary | ICD-10-CM | POA: Diagnosis not present

## 2021-05-26 DIAGNOSIS — Z13820 Encounter for screening for osteoporosis: Secondary | ICD-10-CM

## 2021-05-26 DIAGNOSIS — IMO0002 Reserved for concepts with insufficient information to code with codable children: Secondary | ICD-10-CM

## 2021-05-26 DIAGNOSIS — Z131 Encounter for screening for diabetes mellitus: Secondary | ICD-10-CM | POA: Diagnosis not present

## 2021-05-26 DIAGNOSIS — E119 Type 2 diabetes mellitus without complications: Secondary | ICD-10-CM | POA: Diagnosis not present

## 2021-05-26 DIAGNOSIS — E1169 Type 2 diabetes mellitus with other specified complication: Secondary | ICD-10-CM

## 2021-05-26 DIAGNOSIS — Z79899 Other long term (current) drug therapy: Secondary | ICD-10-CM | POA: Diagnosis not present

## 2021-05-26 DIAGNOSIS — Z0000 Encounter for general adult medical examination without abnormal findings: Secondary | ICD-10-CM | POA: Diagnosis not present

## 2021-05-26 DIAGNOSIS — E785 Hyperlipidemia, unspecified: Secondary | ICD-10-CM

## 2021-05-26 DIAGNOSIS — I10 Essential (primary) hypertension: Secondary | ICD-10-CM

## 2021-05-26 DIAGNOSIS — Z1329 Encounter for screening for other suspected endocrine disorder: Secondary | ICD-10-CM | POA: Diagnosis not present

## 2021-05-26 DIAGNOSIS — E1165 Type 2 diabetes mellitus with hyperglycemia: Secondary | ICD-10-CM | POA: Diagnosis not present

## 2021-05-26 DIAGNOSIS — Z23 Encounter for immunization: Secondary | ICD-10-CM | POA: Diagnosis not present

## 2021-05-26 DIAGNOSIS — E1122 Type 2 diabetes mellitus with diabetic chronic kidney disease: Secondary | ICD-10-CM

## 2021-05-26 LAB — COMPLETE METABOLIC PANEL WITH GFR
AG Ratio: 1.7 (calc) (ref 1.0–2.5)
ALT: 16 U/L (ref 6–29)
AST: 18 U/L (ref 10–35)
Albumin: 4.5 g/dL (ref 3.6–5.1)
Alkaline phosphatase (APISO): 73 U/L (ref 37–153)
BUN/Creatinine Ratio: 22 (calc) (ref 6–22)
BUN: 23 mg/dL (ref 7–25)
CO2: 29 mmol/L (ref 20–32)
Calcium: 10.3 mg/dL (ref 8.6–10.4)
Chloride: 98 mmol/L (ref 98–110)
Creat: 1.06 mg/dL — ABNORMAL HIGH (ref 0.60–1.00)
Globulin: 2.7 g/dL (calc) (ref 1.9–3.7)
Glucose, Bld: 325 mg/dL — ABNORMAL HIGH (ref 65–99)
Potassium: 4.5 mmol/L (ref 3.5–5.3)
Sodium: 137 mmol/L (ref 135–146)
Total Bilirubin: 0.7 mg/dL (ref 0.2–1.2)
Total Protein: 7.2 g/dL (ref 6.1–8.1)
eGFR: 56 mL/min/{1.73_m2} — ABNORMAL LOW (ref >=60)

## 2021-05-26 LAB — CBC
HCT: 42.5 % (ref 35.0–45.0)
Hemoglobin: 14.2 g/dL (ref 11.7–15.5)
MCH: 30.6 pg (ref 27.0–33.0)
MCHC: 33.4 g/dL (ref 32.0–36.0)
MCV: 91.6 fL (ref 80.0–100.0)
MPV: 10.2 fL (ref 7.5–12.5)
Platelets: 337 10*3/uL (ref 140–400)
RBC: 4.64 10*6/uL (ref 3.80–5.10)
RDW: 11.4 % (ref 11.0–15.0)
WBC: 6.6 10*3/uL (ref 3.8–10.8)

## 2021-05-26 LAB — POCT GLYCOSYLATED HEMOGLOBIN (HGB A1C): Hemoglobin A1C: 10.5 % — AB (ref 4.0–5.6)

## 2021-05-26 LAB — LIPID PANEL W/REFLEX DIRECT LDL
Cholesterol: 117 mg/dL (ref <200)
HDL: 34 mg/dL — ABNORMAL LOW (ref >=50)
LDL Cholesterol (Calc): 53 mg/dL (calc)
Non-HDL Cholesterol (Calc): 83 mg/dL (calc) (ref <130)
Total CHOL/HDL Ratio: 3.4 (calc) (ref <5.0)
Triglycerides: 257 mg/dL — ABNORMAL HIGH (ref <150)

## 2021-05-26 LAB — TSH: TSH: 2.08 mIU/L (ref 0.40–4.50)

## 2021-05-26 MED ORDER — METFORMIN HCL 1000 MG PO TABS: ORAL_TABLET | ORAL | 1 refills | Status: DC

## 2021-05-26 MED ORDER — OZEMPIC (0.25 OR 0.5 MG/DOSE) 2 MG/1.5ML ~~LOC~~ SOPN: 0.5000 mg | PEN_INJECTOR | SUBCUTANEOUS | 2 refills | Status: DC

## 2021-05-26 MED ORDER — LOSARTAN POTASSIUM-HCTZ 100-25 MG PO TABS: 1.0000 | ORAL_TABLET | Freq: Every day | ORAL | 3 refills | Status: DC

## 2021-05-26 MED ORDER — ATORVASTATIN CALCIUM 40 MG PO TABS: 40.0000 mg | ORAL_TABLET | Freq: Every day | ORAL | 3 refills | Status: DC

## 2021-05-26 NOTE — Patient Instructions (Addendum)
Flonase 2 sprays each nostril   Health Maintenance for Postmenopausal Women Menopause is a normal process in which your ability to get pregnant comes to an end. This process happens slowly over many months or years, usually between the ages of 31 and 48. Menopause is complete when you have missed your menstrual periods for 12 months. It is important to talk with your health care provider about some of the most common conditions that affect women after menopause (postmenopausal women). These include heart disease, cancer, and bone loss (osteoporosis). Adopting a healthy lifestyle and getting preventive care can help to promote your health and wellness. The actions you take can also lower your chances of developing some of these common conditions. What should I know about menopause? During menopause, you may get a number of symptoms, such as: Hot flashes. These can be moderate or severe. Night sweats. Decrease in sex drive. Mood swings. Headaches. Tiredness. Irritability. Memory problems. Insomnia. Choosing to treat or not to treat these symptoms is a decision that you make with your health care provider. Do I need hormone replacement therapy? Hormone replacement therapy is effective in treating symptoms that are caused by menopause, such as hot flashes and night sweats. Hormone replacement carries certain risks, especially as you become older. If you are thinking about using estrogen or estrogen with progestin, discuss the benefits and risks with your health care provider. What is my risk for heart disease and stroke? The risk of heart disease, heart attack, and stroke increases as you age. One of the causes may be a change in the body's hormones during menopause. This can affect how your body uses dietary fats, triglycerides, and cholesterol. Heart attack and stroke are medical emergencies. There are many things that you can do to help prevent heart disease and stroke. Watch your blood  pressure High blood pressure causes heart disease and increases the risk of stroke. This is more likely to develop in people who have high blood pressure readings, are of African descent, or are overweight. Have your blood pressure checked: Every 3-5 years if you are 30-50 years of age. Every year if you are 29 years old or older. Eat a healthy diet  Eat a diet that includes plenty of vegetables, fruits, low-fat dairy products, and lean protein. Do not eat a lot of foods that are high in solid fats, added sugars, or sodium. Get regular exercise Get regular exercise. This is one of the most important things you can do for your health. Most adults should: Try to exercise for at least 150 minutes each week. The exercise should increase your heart rate and make you sweat (moderate-intensity exercise). Try to do strengthening exercises at least twice each week. Do these in addition to the moderate-intensity exercise. Spend less time sitting. Even light physical activity can be beneficial. Other tips Work with your health care provider to achieve or maintain a healthy weight. Do not use any products that contain nicotine or tobacco, such as cigarettes, e-cigarettes, and chewing tobacco. If you need help quitting, ask your health care provider. Know your numbers. Ask your health care provider to check your cholesterol and your blood sugar (glucose). Continue to have your blood tested as directed by your health care provider. Do I need screening for cancer? Depending on your health history and family history, you may need to have cancer screening at different stages of your life. This may include screening for: Breast cancer. Cervical cancer. Lung cancer. Colorectal cancer. What is my risk  for osteoporosis? After menopause, you may be at increased risk for osteoporosis. Osteoporosis is a condition in which bone destruction happens more quickly than new bone creation. To help prevent osteoporosis or  the bone fractures that can happen because of osteoporosis, you may take the following actions: If you are 3-52 years old, get at least 1,000 mg of calcium and at least 600 mg of vitamin D per day. If you are older than age 75 but younger than age 17, get at least 1,200 mg of calcium and at least 600 mg of vitamin D per day. If you are older than age 63, get at least 1,200 mg of calcium and at least 800 mg of vitamin D per day. Smoking and drinking excessive alcohol increase the risk of osteoporosis. Eat foods that are rich in calcium and vitamin D, and do weight-bearing exercises several times each week as directed by your health care provider. How does menopause affect my mental health? Depression may occur at any age, but it is more common as you become older. Common symptoms of depression include: Low or sad mood. Changes in sleep patterns. Changes in appetite or eating patterns. Feeling an overall lack of motivation or enjoyment of activities that you previously enjoyed. Frequent crying spells. Talk with your health care provider if you think that you are experiencing depression. General instructions See your health care provider for regular wellness exams and vaccines. This may include: Scheduling regular health, dental, and eye exams. Getting and maintaining your vaccines. These include: Influenza vaccine. Get this vaccine each year before the flu season begins. Pneumonia vaccine. Shingles vaccine. Tetanus, diphtheria, and pertussis (Tdap) booster vaccine. Your health care provider may also recommend other immunizations. Tell your health care provider if you have ever been abused or do not feel safe at home. Summary Menopause is a normal process in which your ability to get pregnant comes to an end. This condition causes hot flashes, night sweats, decreased interest in sex, mood swings, headaches, or lack of sleep. Treatment for this condition may include hormone replacement  therapy. Take actions to keep yourself healthy, including exercising regularly, eating a healthy diet, watching your weight, and checking your blood pressure and blood sugar levels. Get screened for cancer and depression. Make sure that you are up to date with all your vaccines. This information is not intended to replace advice given to you by your health care provider. Make sure you discuss any questions you have with your health care provider. Document Revised: 08/20/2018 Document Reviewed: 08/20/2018 Elsevier Patient Education  [place].   Influenza (Flu) Vaccine (Inactivated or Recombinant): What You Need to Know 1. Why get vaccinated? Influenza vaccine can prevent influenza (flu). Flu is a contagious disease that spreads around the [place] every year, usually between October and May. Anyone can get the flu, but it is more dangerous for some people. Infants and young children, people 48 years and older, pregnant people, and people with certain health conditions or a weakened immune system are at greatest risk of flu complications. Pneumonia, bronchitis, sinus infections, and ear infections are examples of flu-related complications. If you have a medical condition, such as heart disease, cancer, or diabetes, flu can make it worse. Flu can cause fever and chills, sore throat, muscle aches, fatigue, cough, headache, and runny or stuffy nose. Some people may have vomiting and diarrhea, though this is more common in children than adults. In an average year, thousands of people in the [place] die  from flu, and many more are hospitalized. Flu vaccine prevents millions of illnesses and flu-related visits to the doctor each year. 2. Influenza vaccines CDC recommends everyone 6 months and older get vaccinated every flu season. Children 6 months through 79 years of age may need 2 doses during a single flu season. Everyone else needs only 1 dose each flu season. It takes about 2 weeks  for protection to develop after vaccination. There are many flu viruses, and they are always changing. Each year a new flu vaccine is made to protect against the influenza viruses believed to be likely to cause disease in the upcoming flu season. Even when the vaccine doesn't exactly match these viruses, it may still provide some protection. Influenza vaccine does not cause flu. Influenza vaccine may be given at the same time as other vaccines. 3. Talk with your health care provider Tell your vaccination provider if the person getting the vaccine: Has had an allergic reaction after a previous dose of influenza vaccine, or has any severe, life-threatening allergies Has ever had Guillain-Barr Syndrome (also called "GBS") In some cases, your health care provider may decide to postpone influenza vaccination until a future visit. Influenza vaccine can be administered at any time during pregnancy. People who are or will be pregnant during influenza season should receive inactivated influenza vaccine. People with minor illnesses, such as a cold, may be vaccinated. People who are moderately or severely ill should usually wait until they recover before getting influenza vaccine. Your health care provider can give you more information. 4. Risks of a vaccine reaction Soreness, redness, and swelling where the shot is given, fever, muscle aches, and headache can happen after influenza vaccination. There may be a very small increased risk of Guillain-Barr Syndrome (GBS) after inactivated influenza vaccine (the flu shot). Young children who get the flu shot along with pneumococcal vaccine (PCV13) and/or DTaP vaccine at the same time might be slightly more likely to have a seizure caused by fever. Tell your health care provider if a child who is getting flu vaccine has ever had a seizure. People sometimes faint after medical procedures, including vaccination. Tell your provider if you feel dizzy or have vision  changes or ringing in the ears. As with any medicine, there is a very remote chance of a vaccine causing a severe allergic reaction, other serious injury, or death. 5. What if there is a serious problem? An allergic reaction could occur after the vaccinated person leaves the clinic. If you see signs of a severe allergic reaction (hives, swelling of the face and throat, difficulty breathing, a fast heartbeat, dizziness, or weakness), call 9-1-1 and get the person to the nearest hospital. For other signs that concern you, call your health care provider. Adverse reactions should be reported to the Vaccine Adverse Event Reporting System (VAERS). Your health care provider will usually file this report, or you can do it yourself. Visit the VAERS website at www.vaers.SamedayNews.es or call 226-689-3634. VAERS is only for reporting reactions, and VAERS staff members do not give medical advice. 6. The National Vaccine Injury Compensation Program The [employer] Vaccine Injury Compensation Program (VICP) is a federal program that was created to compensate people who may have been injured by certain vaccines. Claims regarding alleged injury or death due to vaccination have a time limit for filing, which may be as short as two years. Visit the VICP website at GoldCloset.com.ee or call 6040120725 to learn about the program and about filing a claim. 7. How can  I learn more? Ask your health care provider. Call your local or state health department. Visit the website of the Food and Drug Administration (FDA) for vaccine package inserts and additional information at TraderRating.uy. Contact the Centers for Disease Control and Prevention (CDC): Call (337)641-9744 (1-800-CDC-INFO) or Visit CDC's website at https://gibson.com/. Vaccine Information Statement Inactivated Influenza Vaccine (04/15/2020) This information is not intended to replace advice given to you by your health care  provider. Make sure you discuss any questions you have with your health care provider. Document Revised: 06/02/2020 Document Reviewed: 06/02/2020 Elsevier Patient Education  [place].   Recombinant Zoster (Shingles) Vaccine: What You Need to Know 1. Why get vaccinated? Recombinant zoster (shingles) vaccine can prevent shingles. Shingles (also called herpes zoster, or just zoster) is a painful skin rash, usually with blisters. In addition to the rash, shingles can cause fever, headache, chills, or upset stomach. Rarely, shingles can lead to complications such as pneumonia, hearing problems, blindness, brain inflammation (encephalitis), or death. The risk of shingles increases with age. The most common complication of shingles is long-term nerve pain called postherpetic neuralgia (PHN). PHN occurs in the areas where the shingles rash was and can last for months or years after the rash goes away. The pain from PHN can be severe and debilitating. The risk of PHN increases with age. An older adult with shingles is more likely to develop PHN and have longer lasting and more severe pain than a younger person. People with weakened immune systems also have a higher risk of getting shingles and complications from the disease. Shingles is caused by varicella-zoster virus, the same virus that causes chickenpox. After you have chickenpox, the virus stays in your body and can cause shingles later in life. Shingles cannot be passed from one person to another, but the virus that causes shingles can spread and cause chickenpox in someone who has never had chickenpox or has never received chickenpox vaccine. 2. Recombinant shingles vaccine Recombinant shingles vaccine provides strong protection against shingles. By preventing shingles, recombinant shingles vaccine also protects against PHN and other complications. Recombinant shingles vaccine is recommended for: Adults 4 years and older Adults 19 years and  older who have a weakened immune system because of disease or treatments Shingles vaccine is given as a two-dose series. For most people, the second dose should be given 2 to 6 months after the first dose. Some people who have or will have a weakened immune system can get the second dose 1 to 2 months after the first dose. Ask your health care provider for guidance. People who have had shingles in the past and people who have received varicella (chickenpox) vaccine are recommended to get recombinant shingles vaccine. The vaccine is also recommended for people who have already gotten another type of shingles vaccine, the live shingles vaccine. There is no live virus in recombinant shingles vaccine. Shingles vaccine may be given at the same time as other vaccines. 3. Talk with your health care provider Tell your vaccination provider if the person getting the vaccine: Has had an allergic reaction after a previous dose of recombinant shingles vaccine, or has any severe, life-threatening allergies Is currently experiencing an episode of shingles Is pregnant In some cases, your health care provider may decide to postpone shingles vaccination until a future visit. People with minor illnesses, such as a cold, may be vaccinated. People who are moderately or severely ill should usually wait until they recover before getting recombinant shingles vaccine. Your health care  provider can give you more information. 4. Risks of a vaccine reaction A sore arm with mild or moderate pain is very common after recombinant shingles vaccine. Redness and swelling can also happen at the site of the injection. Tiredness, muscle pain, headache, shivering, fever, stomach pain, and nausea are common after recombinant shingles vaccine. These side effects may temporarily prevent a vaccinated person from doing regular activities. Symptoms usually go away on their own in 2 to 3 days. You should still get the second dose of recombinant  shingles vaccine even if you had one of these reactions after the first dose. Guillain-Barr syndrome (GBS), a serious nervous system disorder, has been reported very rarely after recombinant zoster vaccine. People sometimes faint after medical procedures, including vaccination. Tell your provider if you feel dizzy or have vision changes or ringing in the ears. As with any medicine, there is a very remote chance of a vaccine causing a severe allergic reaction, other serious injury, or death. 5. What if there is a serious problem? An allergic reaction could occur after the vaccinated person leaves the clinic. If you see signs of a severe allergic reaction (hives, swelling of the face and throat, difficulty breathing, a fast heartbeat, dizziness, or weakness), call 9-1-1 and get the person to the nearest hospital. For other signs that concern you, call your health care provider. Adverse reactions should be reported to the Vaccine Adverse Event Reporting System (VAERS). Your health care provider will usually file this report, or you can do it yourself. Visit the VAERS website at www.vaers.SamedayNews.es or call 628-831-4042. VAERS is only for reporting reactions, and VAERS staff members do not give medical advice. 6. How can I learn more? Ask your health care provider. Call your local or state health department. Visit the website of the Food and Drug Administration (FDA) for vaccine package inserts and additional information at http://lopez-wang.org/. Contact the Centers for Disease Control and Prevention (CDC): Call 9408667106 (1-800-CDC-INFO) or Visit CDC's website at http://hunter.com/. Vaccine Information Statement Recombinant Zoster Vaccine (10/14/2020) This information is not intended to replace advice given to you by your health care provider. Make sure you discuss any questions you have with your health care provider. Document Revised: 10/28/2020 Document Reviewed:  10/28/2020 Elsevier Patient Education  2022 [employer].

## 2021-05-26 NOTE — Progress Notes (Signed)
Subjective:     Teresa Spence is a 70 y.o. female and is here for a comprehensive physical exam. The patient reports problems - no problems .  Social History   Socioeconomic History   Marital status: Widowed    Spouse name: Teresa Spence   Number of children: 1   Years of education: 17   Highest education level: Bachelor's degree (e.g., BA, AB, BS)  Occupational History   Occupation: Optometrist    Comment: retired  Tobacco Use   Smoking status: Former   Smokeless tobacco: Never  Scientific laboratory technician Use: Never used  Substance and Sexual Activity   Alcohol use: Yes    Alcohol/week: 1.0 standard drink    Types: 1 Glasses of wine per week    Comment: 1 glass a month   Drug use: No   Sexual activity: Not Currently  Other Topics Concern   Not on file  Social History Narrative   Lives alone. She has one child. She enjoys reading, gardening, knitting and painting in her free time.   Social Determinants of Health   Financial Resource Strain: Low Risk    Difficulty of Paying Living Expenses: Not hard at all  Food Insecurity: No Food Insecurity   Worried About Charity fundraiser in the Last Year: Never true   [place] in the Last Year: Never true  Transportation Needs: No Transportation Needs   Lack of Transportation (Medical): No   Lack of Transportation (Non-Medical): No  Physical Activity: Inactive   Days of Exercise per Week: 0 days   Minutes of Exercise per Session: 0 min  Stress: No Stress Concern Present   Feeling of Stress : Not at all  Social Connections: Moderately Integrated   Frequency of Communication with Friends and Family: More than three times a week   Frequency of Social Gatherings with Friends and Family: More than three times a week   Attends Religious Services: More than 4 times per year   Active Member of [employer] or Organizations: Yes   Attends Archivist Meetings: More than 4 times per year   Marital Status: Widowed  Human resources officer  Violence: Not At Risk   Fear of Current or Ex-Partner: No   Emotionally Abused: No   Physically Abused: No   Sexually Abused: No   Health Maintenance  Topic Date Due   COVID-19 Vaccine (3 - Booster for Janssen series) 06/11/2021 (Originally 11/10/2020)   MAMMOGRAM  07/14/2021   Zoster Vaccines- Shingrix (2 of 2) 07/21/2021   FOOT EXAM  08/23/2021   OPHTHALMOLOGY EXAM  08/30/2021   HEMOGLOBIN A1C  11/23/2021   COLONOSCOPY (Pts 45-59yr Insurance coverage will need to be confirmed)  05/16/2023   TETANUS/TDAP  04/04/2029   INFLUENZA VACCINE  Completed   DEXA SCAN  Completed   Hepatitis C Screening  Completed   HPV VACCINES  Aged Out    The following portions of the patient's history were reviewed and updated as appropriate: allergies, current medications, past family history, past medical history, past social history, past surgical history, and problem list.  Review of Systems Pertinent items noted in HPI and remainder of comprehensive ROS otherwise negative.   Objective:    BP 131/72   Pulse 82   Temp 98.6 F (37 C)   Ht 5' 5.5" (1.664 m)   Wt 186 lb 11.2 oz (84.7 kg)   SpO2 99%   BMI 30.60 kg/m  General appearance: alert, cooperative, appears stated age,  and moderately obese Head: Normocephalic, without obvious abnormality, atraumatic Eyes: conjunctivae/corneas clear. PERRL, EOM's intact. Fundi benign. Ears: normal TM's and external ear canals both ears Nose: Nares normal. Septum midline. Mucosa normal. No drainage or sinus tenderness. Throat: lips, mucosa, and tongue normal; teeth and gums normal Neck: no adenopathy, no carotid bruit, no JVD, supple, symmetrical, trachea midline, and thyroid not enlarged, symmetric, no tenderness/mass/nodules Back: symmetric, no curvature. ROM normal. No CVA tenderness. Lungs: clear to auscultation bilaterally Heart: regular rate and rhythm, S1, S2 normal, no murmur, click, rub or gallop Abdomen: soft, non-tender; bowel sounds normal; no  masses,  no organomegaly Extremities: extremities normal, atraumatic, no cyanosis or edema Skin: Skin color, texture, turgor normal. No rashes or lesions Neurologic: Grossly normal   .. Depression screen [facility] 2/9 05/29/2021 10/13/2019 09/09/2018 05/21/2018 05/06/2018  Decreased Interest 0 0 0 0 0  Down, Depressed, Hopeless 0 0 0 0 0  PHQ - 2 Score 0 0 0 0 0  Altered sleeping - - - 0 0  Tired, decreased energy - - - 0 0  Change in appetite - - - 0 0  Feeling bad or failure about yourself  - - - 0 0  Trouble concentrating - - - 0 0  Moving slowly or fidgety/restless - - - 0 0  Suicidal thoughts - - - 0 0  PHQ-9 Score - - - 0 0   .[name] Lab Results  Component Value Date   HGBA1C 10.5 (A) 05/26/2021     Assessment:    Healthy female exam.     Plan:     [name] KitchenMarland KitchenKevionna was seen today for annual exam, diabetes and hypertension.  Diagnoses and all orders for this visit:  Routine physical examination -     Lipid Panel w/reflex Direct LDL -     COMPLETE METABOLIC PANEL WITH GFR -     TSH -     CBC -     Referral to Nutrition and Diabetes Services  Screening for diabetes mellitus -     COMPLETE METABOLIC PANEL WITH GFR  Screening for lipid disorders -     Lipid Panel w/reflex Direct LDL  Screening for thyroid disorder -     TSH  Medication management -     Lipid Panel w/reflex Direct LDL -     COMPLETE METABOLIC PANEL WITH GFR -     TSH -     CBC  Essential hypertension -     losartan-hydrochlorothiazide (HYZAAR) 100-25 MG tablet; Take 1 tablet by mouth daily.  Hyperlipidemia associated with type 2 diabetes mellitus (HCC) -     atorvastatin (LIPITOR) 40 MG tablet; Take 1 tablet (40 mg total) by mouth daily.  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 -     Referral to Nutrition and Diabetes Services -     Semaglutide,0.25 or 0.'5MG'$ /DOS, (OZEMPIC, 0.25 OR 0.5 MG/DOSE,) 2 MG/1.5ML SOPN; Inject 0.5 mg into the skin once a week. -     POCT glycosylated hemoglobin (Hb A1C)   Discussed 150 minutes of exercise a week.  Encouraged vitamin D 1000 units and Calcium '1300mg'$   or 4 servings of dairy a day.  PHQ to goal.  Fasting labs ordered.  Bone density ordered for November.  Mammogram UTD.  Colonoscopy UTD.  Shingrix 1st dose given today. 2nd dose in 3 months.  Covid x2. Pneumonia vaccine UTD.   A1C not controlled.  Discussed nutrition. Made nutrition referral.  Continue metformin.  Start ozempic. Discussed side effects.  On ARB. BP to goal.  On statin.  Eye exam UTD.  Foot exam see podiatry.  Follow up in 3 months.    See After Visit Summary for Counseling Recommendations

## 2021-05-28 NOTE — Progress Notes (Signed)
Zeola,   LDL under 70 and to goal.  TG are elevated but likely due to glucose/diabetes.  If not taking fish oil I would start '4000mg'$  a day.   Thyroid looks great.   Kidney function has decreased a bit. Likely due to diabetes. I would like to start a medication that has shown to protect your kidneys from further decline and also can help your sugars even more in the process. Its called farxiga. It will cause you to urinate a little more. Are you ok to try?

## 2021-05-29 ENCOUNTER — Ambulatory Visit (INDEPENDENT_AMBULATORY_CARE_PROVIDER_SITE_OTHER): Payer: Medicare HMO | Admitting: Physician Assistant

## 2021-05-29 DIAGNOSIS — Z1231 Encounter for screening mammogram for malignant neoplasm of breast: Secondary | ICD-10-CM | POA: Diagnosis not present

## 2021-05-29 DIAGNOSIS — Z0000 Encounter for general adult medical examination without abnormal findings: Secondary | ICD-10-CM | POA: Diagnosis not present

## 2021-05-29 NOTE — Progress Notes (Signed)
MEDICARE ANNUAL WELLNESS VISIT  05/29/2021  Telephone Visit Disclaimer This Medicare AWV was conducted by telephone due to national recommendations for restrictions regarding the COVID-19 Pandemic (e.g. social distancing).  I verified, using two identifiers, that I am speaking with Teresa Spence or their authorized healthcare agent. I discussed the limitations, risks, security, and privacy concerns of performing an evaluation and management service by telephone and the potential availability of an in-person appointment in the future. The patient expressed understanding and agreed to proceed.  Location of Patient: Home Location of Provider (nurse):  In the office.  Subjective:    Rebacca Spence is a 70 y.o. female patient of Donella Stade, PA-C who had a [employer] Visit today via telephone. Nakoma is Retired and lives alone. she has 1 child. she reports that she is socially active and does interact with friends/family regularly. she is minimally physically active and enjoys gardening, knitting and painting.  Patient Care Team: Lavada Mesi as PCP - General (Family Medicine)  Advanced Directives 05/29/2021 10/13/2019 12/27/2016  Does Patient Have a Medical Advance Directive? Yes Yes Yes  Type of Advance Directive Living will [place];Living will [place]  Does patient want to make changes to medical advance directive? No - Patient declined No - Patient declined -  Copy of [place] in Chart? - No - copy requested [facility] Utilization Over the Past 12 Months: # of hospitalizations or ER visits: 0 # of surgeries: 0  Review of Systems    Patient reports that her overall health is worse compared to last year.  History obtained from chart review and the patient  Patient Reported Readings (BP, Pulse, CBG, Weight, etc) none  Pain Assessment Pain : No/denies pain     Current Medications &  Allergies (verified) Allergies as of 05/29/2021       Reactions   Lamisil [terbinafine]    Stomach upset and Diarrhea with excess gas in tummy.         Medication List        Accurate as of May 29, 2021 11:28 AM. If you have any questions, ask your nurse or doctor.          atorvastatin 40 MG tablet Commonly known as: LIPITOR Take 1 tablet (40 mg total) by mouth daily.   cholecalciferol 25 MCG (1000 UNIT) tablet Commonly known as: VITAMIN D3 Take 1,000 Units by mouth daily.   losartan-hydrochlorothiazide 100-25 MG tablet Commonly known as: HYZAAR Take 1 tablet by mouth daily.   magnesium 30 MG tablet Take 30 mg by mouth 2 (two) times daily.   metFORMIN 1000 MG tablet Commonly known as: GLUCOPHAGE TAKE ONE TABLET BY MOUTH TWICE A DAY WITH MEALS.   Ozempic (0.25 or 0.5 MG/DOSE) 2 MG/1.5ML Sopn Generic drug: Semaglutide(0.25 or 0.'5MG'$ /DOS) Inject 0.5 mg into the skin once a week.   vitamin C 100 MG tablet Take 100 mg by mouth daily.        History (reviewed): Past Medical History:  Diagnosis Date   Bilateral carpal tunnel syndrome 01/20/2020   Diabetes mellitus without complication ([place])    Hypertension    Past Surgical History:  Procedure Laterality Date   BUNIONECTOMY Right 06/10/2019   HAMMER TOE SURGERY     MEDIAL PARTIAL KNEE REPLACEMENT     TONSILLECTOMY     Family History  Problem Relation Age of Onset   Diabetes Mother  Hypertension Mother    Hypertension Father    COPD Father    Heart attack Brother    Stroke Maternal Grandmother    Heart attack Maternal Grandfather    Emphysema Neg Hx    Social History   Socioeconomic History   Marital status: Widowed    Spouse name: Teresa Spence   Number of children: 1   Years of education: 17   Highest education level: Bachelor's degree (e.g., BA, AB, BS)  Occupational History   Occupation: Optometrist    Comment: retired  Tobacco Use   Smoking status: Former   Smokeless tobacco: Never   Scientific laboratory technician Use: Never used  Substance and Sexual Activity   Alcohol use: Yes    Alcohol/week: 1.0 standard drink    Types: 1 Glasses of wine per week    Comment: 1 glass a month   Drug use: No   Sexual activity: Not Currently  Other Topics Concern   Not on file  Social History Narrative   Lives alone. She has one child. She enjoys reading, gardening, knitting and painting in her free time.   Social Determinants of Health   Financial Resource Strain: Low Risk    Difficulty of Paying Living Expenses: Not hard at all  Food Insecurity: No Food Insecurity   Worried About Charity fundraiser in the Last Year: Never true   [place] in the Last Year: Never true  Transportation Needs: No Transportation Needs   Lack of Transportation (Medical): No   Lack of Transportation (Non-Medical): No  Physical Activity: Inactive   Days of Exercise per Week: 0 days   Minutes of Exercise per Session: 0 min  Stress: No Stress Concern Present   Feeling of Stress : Not at all  Social Connections: Moderately Integrated   Frequency of Communication with Friends and Family: More than three times a week   Frequency of Social Gatherings with Friends and Family: More than three times a week   Attends Religious Services: More than 4 times per year   Active Member of [employer] or Organizations: Yes   Attends Archivist Meetings: More than 4 times per year   Marital Status: Widowed    Activities of Daily Living In your present state of health, do you have any difficulty performing the following activities: 05/29/2021  Hearing? N  Vision? N  Difficulty concentrating or making decisions? N  Walking or climbing stairs? N  Dressing or bathing? N  Doing errands, shopping? N  Preparing Food and eating ? N  Using the Toilet? N  In the past six months, have you accidently leaked urine? N  Comment urine urgency.  Do you have problems with loss of bowel control? N  Managing your  Medications? N  Managing your Finances? N  Housekeeping or managing your Housekeeping? N  Some recent data might be hidden    Patient Education/ Literacy How often do you need to have someone help you when you read instructions, pamphlets, or other written materials from your doctor or pharmacy?: 1 - Never What is the last grade level you completed in school?: Bachelor's degree and other creditionals.  Exercise Current Exercise Habits: The patient does not participate in regular exercise at present, Exercise limited by: None identified  Diet Patient reports consuming  2-3  meals a day and 3-4 snack(s) a day Patient reports that her primary diet is: Regular Patient reports that she does have regular access to food.  Depression Screen PHQ 2/9 Scores 05/29/2021 10/13/2019 09/09/2018 05/21/2018 05/06/2018 06/10/2017 05/06/2017  PHQ - 2 Score 0 0 0 0 0 0 0  PHQ- 9 Score - - - 0 0 - -     Fall Risk Fall Risk  05/29/2021 10/13/2019 05/21/2018  Falls in the past year? 0 0 No  Number falls in past yr: 0 - -  Injury with Fall? 0 - -  Risk for fall due to : No Fall Risks Impaired balance/gait -  Risk for fall due to: Comment - balance issues- one leg shorter than the other -  Follow up Falls evaluation completed Falls prevention discussed -     Objective:  Teresa Spence seemed alert and oriented and she participated appropriately during our telephone visit.  Blood Pressure Weight BMI  BP Readings from Last 3 Encounters:  05/26/21 131/72  02/21/21 (!) 162/65  08/23/20 128/62   Wt Readings from Last 3 Encounters:  05/26/21 186 lb 11.2 oz (84.7 kg)  02/21/21 195 lb (88.5 kg)  08/23/20 189 lb (85.7 kg)   BMI Readings from Last 1 Encounters:  05/26/21 30.60 kg/m    *Unable to obtain current vital signs, weight, and BMI due to telephone visit type  Hearing/Vision  [place] did not seem to have difficulty with hearing/understanding during the telephone conversation Reports that she has had a  formal eye exam by an eye care professional within the past year Reports that she has not had a formal hearing evaluation within the past year *Unable to fully assess hearing and vision during telephone visit type  Cognitive Function: 6CIT Screen 05/29/2021 10/13/2019  What Year? 0 points 0 points  What month? 0 points 0 points  What time? 0 points 0 points  Count back from 20 0 points 0 points  Months in reverse 0 points 0 points  Repeat phrase 0 points 0 points  Total Score 0 0   (Normal:0-7, Significant for Dysfunction: >8)  Normal Cognitive Function Screening: Yes   Immunization & Health Maintenance Record Immunization History  Administered Date(s) Administered   Fluad Quad(high Dose 65+) 05/26/2021   Influenza, High Dose Seasonal PF 05/06/2019   Influenza,inj,Quad PF,6+ Mos 08/09/2017   Influenza-Unspecified 06/19/2018, 05/11/2020   Janssen (J&J) SARS-COV-2 Vaccination 12/19/2019   PFIZER(Purple Top)SARS-COV-2 Vaccination 09/15/2020   Pneumococcal Conjugate-13 05/06/2017   Pneumococcal Polysaccharide-23 11/05/2017   Tdap 11/24/2016, 04/05/2019   Zoster Recombinat (Shingrix) 05/26/2021    Health Maintenance  Topic Date Due   COVID-19 Vaccine (3 - Booster for Janssen series) 06/11/2021 (Originally 11/10/2020)   MAMMOGRAM  07/14/2021   Zoster Vaccines- Shingrix (2 of 2) 07/21/2021   FOOT EXAM  08/23/2021   OPHTHALMOLOGY EXAM  08/30/2021   HEMOGLOBIN A1C  11/23/2021   COLONOSCOPY (Pts 45-36yr Insurance coverage will need to be confirmed)  05/16/2023   TETANUS/TDAP  04/04/2029   INFLUENZA VACCINE  Completed   DEXA SCAN  Completed   Hepatitis C Screening  Completed   HPV VACCINES  Aged Out       Assessment  This is a routine wellness examination for [employer]  Health Maintenance: Due or Overdue There are no preventive care reminders to display for this patient.  AMarta Lamasdoes not need a referral for Community Assistance: Care Management:   no Social  Work:    no Prescription Assistance:  no Nutrition/Diabetes Education:  no   Plan:  Personalized Goals  Goals Addressed  This Visit's Progress     Patient Stated (pt-stated)        05/29/2021 AWV Goal: Diabetes Management  Patient will maintain an A1C level below 8.0 Patient will not develop any diabetic foot complications Patient will not experience any hypoglycemic episodes over the next 3 months Patient will notify our office of any CBG readings outside of the provider recommended range by calling [phone] Patient will adhere to provider recommendations for diabetes management  Patient Self Management Activities take all medications as prescribed and report any negative side effects monitor and record blood sugar readings as directed adhere to a low carbohydrate diet that incorporates lean proteins, vegetables, whole grains, low glycemic fruits check feet daily noting any sores, cracks, injuries, or callous formations see PCP or podiatrist if she notices any changes in her legs, feet, or toenails Patient will visit PCP and have an A1C level checked every 3 to 6 months as directed  have a yearly eye exam to monitor for vascular changes associated with diabetes and will request that the report be sent to her pcp.  consult with her PCP regarding any changes in her health or new or worsening symptoms        Personalized Health Maintenance & Screening Recommendations  Screening mammography 2nd shot - Shingrix  Lung Cancer Screening Recommended: no (Low Dose CT Chest recommended if Age 18-80 years, 30 pack-year currently smoking OR have quit w/in past 15 years) Hepatitis C Screening recommended: no HIV Screening recommended: no  Advanced Directives: Written information was not prepared per patient's request.  Referrals & Orders No orders of the defined types were placed in this encounter.   Follow-up Plan Follow-up with Donella Stade, PA-C as  planned Mammogram referral has been sent and they will call you to schedule. Medicare wellness visit in one year. Patient will access AVS on my chart.   I have personally reviewed and noted the following in the patient's chart:   Medical and social history Use of alcohol, tobacco or illicit drugs  Current medications and supplements Functional ability and status Nutritional status Physical activity Advanced directives List of other physicians Hospitalizations, surgeries, and ER visits in previous 12 months Vitals Screenings to include cognitive, depression, and falls Referrals and appointments  In addition, I have reviewed and discussed with Teresa Spence certain preventive protocols, quality metrics, and best practice recommendations. A written personalized care plan for preventive services as well as general preventive health recommendations is available and can be mailed to the patient at her request.      Tinnie Gens, RN  05/29/2021

## 2021-05-29 NOTE — Patient Instructions (Addendum)
[place] Maintenance Summary and Written Plan of Care  Teresa Spence ,  Thank you for allowing me to perform your Medicare Annual Wellness Visit and for your ongoing commitment to your health.   Health Maintenance & Immunization History Health Maintenance  Topic Date Due  . COVID-19 Vaccine (3 - Booster for Janssen series) 06/11/2021 (Originally 11/10/2020)  . MAMMOGRAM  07/14/2021  . Zoster Vaccines- Shingrix (2 of 2) 07/21/2021  . FOOT EXAM  08/23/2021  . OPHTHALMOLOGY EXAM  08/30/2021  . HEMOGLOBIN A1C  11/23/2021  . COLONOSCOPY (Pts 45-52yr Insurance coverage will need to be confirmed)  05/16/2023  . TETANUS/TDAP  04/04/2029  . INFLUENZA VACCINE  Completed  . DEXA SCAN  Completed  . Hepatitis C Screening  Completed  . HPV VACCINES  Aged Out   Immunization History  Administered Date(s) Administered  . Fluad Quad(high Dose 65+) 05/26/2021  . Influenza, High Dose Seasonal PF 05/06/2019  . Influenza,inj,Quad PF,6+ Mos 08/09/2017  . Influenza-Unspecified 06/19/2018, 05/11/2020  . Janssen (J&J) SARS-COV-2 Vaccination 12/19/2019  . PFIZER(Purple Top)SARS-COV-2 Vaccination 09/15/2020  . Pneumococcal Conjugate-13 05/06/2017  . Pneumococcal Polysaccharide-23 11/05/2017  . Tdap 11/24/2016, 04/05/2019  . Zoster Recombinat (Shingrix) 05/26/2021    These are the patient goals that we discussed:  Goals Addressed              This Visit's Progress   .  Patient Stated (pt-stated)        05/29/2021 AWV Goal: Diabetes Management  Patient will maintain an A1C level below 8.0 Patient will not develop any diabetic foot complications Patient will not experience any hypoglycemic episodes over the next 3 months Patient will notify our office of any CBG readings outside of the provider recommended range by calling [phone] will adhere to provider recommendations for diabetes management  Patient Self Management Activities take all medications  as prescribed and report any negative side effects monitor and record blood sugar readings as directed adhere to a low carbohydrate diet that incorporates lean proteins, vegetables, whole grains, low glycemic fruits check feet daily noting any sores, cracks, injuries, or callous formations see PCP or podiatrist if she notices any changes in her legs, feet, or toenails Patient will visit PCP and have an A1C level checked every 3 to 6 months as directed  have a yearly eye exam to monitor for vascular changes associated with diabetes and will request that the report be sent to her pcp.  consult with her PCP regarding any changes in her health or new or worsening symptoms         This is a list of Health Maintenance Items that are overdue or due now: Screening mammography 2nd shot - Shingrix  Orders/Referrals Placed Today: Orders Placed This Encounter  Procedures  . Mammogram 3D SCREEN BREAST BILATERAL    Standing Status:   Future    Standing Expiration Date:   05/29/2022    Scheduling Instructions:     Please call patient to schedule.    Order Specific Question:   Reason for Exam (SYMPTOM  OR DIAGNOSIS REQUIRED)    Answer:   Breast cancer screening    Order Specific Question:   Preferred imaging location?    Answer:   [facility]   (Contact our referral department at [phone] you have not spoken with someone about your referral appointment within the next 5 days)    Follow-up Plan Follow-up with BDonella Stade PA-C as planned Mammogram referral has been  sent and they will call you to schedule. Medicare wellness visit in one year. Patient will access AVS on my chart.      Health Maintenance, Female Adopting a healthy lifestyle and getting preventive care are important in promoting health and wellness. Ask your health care provider about: The right schedule for you to have regular tests and exams. Things you can do on your own to prevent diseases and keep  yourself healthy. What should I know about diet, weight, and exercise? Eat a healthy diet  Eat a diet that includes plenty of vegetables, fruits, low-fat dairy products, and lean protein. Do not eat a lot of foods that are high in solid fats, added sugars, or sodium. Maintain a healthy weight Body mass index (BMI) is used to identify weight problems. It estimates body fat based on height and weight. Your health care provider can help determine your BMI and help you achieve or maintain a healthy weight. Get regular exercise Get regular exercise. This is one of the most important things you can do for your health. Most adults should: Exercise for at least 150 minutes each week. The exercise should increase your heart rate and make you sweat (moderate-intensity exercise). Do strengthening exercises at least twice a week. This is in addition to the moderate-intensity exercise. Spend less time sitting. Even light physical activity can be beneficial. Watch cholesterol and blood lipids Have your blood tested for lipids and cholesterol at 71 years of age, then have this test every 5 years. Have your cholesterol levels checked more often if: Your lipid or cholesterol levels are high. You are older than 71 years of age. You are at high risk for heart disease. What should I know about cancer screening? Depending on your health history and family history, you may need to have cancer screening at various ages. This may include screening for: Breast cancer. Cervical cancer. Colorectal cancer. Skin cancer. Lung cancer. What should I know about heart disease, diabetes, and high blood pressure? Blood pressure and heart disease High blood pressure causes heart disease and increases the risk of stroke. This is more likely to develop in people who have high blood pressure readings, are of African descent, or are overweight. Have your blood pressure checked: Every 3-5 years if you are 18-39 years of  age. Every year if you are 43 years old or older. Diabetes Have regular diabetes screenings. This checks your fasting blood sugar level. Have the screening done: Once every three years after age 60 if you are at a normal weight and have a low risk for diabetes. More often and at a younger age if you are overweight or have a high risk for diabetes. What should I know about preventing infection? Hepatitis B If you have a higher risk for hepatitis B, you should be screened for this virus. Talk with your health care provider to find out if you are at risk for hepatitis B infection. Hepatitis C Testing is recommended for: Everyone born from [birth year] through 1966. Anyone with known risk factors for hepatitis C. Sexually transmitted infections (STIs) Get screened for STIs, including gonorrhea and chlamydia, if: You are sexually active and are younger than 71 years of age. You are older than 71 years of age and your health care provider tells you that you are at risk for this type of infection. Your sexual activity has changed since you were last screened, and you are at increased risk for chlamydia or gonorrhea. Ask your health care provider if  you are at risk. Ask your health care provider about whether you are at high risk for HIV. Your health care provider may recommend a prescription medicine to help prevent HIV infection. If you choose to take medicine to prevent HIV, you should first get tested for HIV. You should then be tested every 3 months for as long as you are taking the medicine. Pregnancy If you are about to stop having your period (premenopausal) and you may become pregnant, seek counseling before you get pregnant. Take 400 to 800 micrograms (mcg) of folic acid every day if you become pregnant. Ask for birth control (contraception) if you want to prevent pregnancy. Osteoporosis and menopause Osteoporosis is a disease in which the bones lose minerals and strength with aging. This can  result in bone fractures. If you are 52 years old or older, or if you are at risk for osteoporosis and fractures, ask your health care provider if you should: Be screened for bone loss. Take a calcium or vitamin D supplement to lower your risk of fractures. Be given hormone replacement therapy (HRT) to treat symptoms of menopause. Follow these instructions at home: Lifestyle Do not use any products that contain nicotine or tobacco, such as cigarettes, e-cigarettes, and chewing tobacco. If you need help quitting, ask your health care provider. Do not use street drugs. Do not share needles. Ask your health care provider for help if you need support or information about quitting drugs. Alcohol use Do not drink alcohol if: Your health care provider tells you not to drink. You are pregnant, may be pregnant, or are planning to become pregnant. If you drink alcohol: Limit how much you use to 0-1 drink a day. Limit intake if you are breastfeeding. Be aware of how much alcohol is in your drink. In the [place] one drink equals one 12 oz bottle of beer (355 mL), one 5 oz glass of wine (148 mL), or one 1 oz glass of hard liquor (44 mL). General instructions Schedule regular health, dental, and eye exams. Stay current with your vaccines. Tell your health care provider if: You often feel depressed. You have ever been abused or do not feel safe at home. Summary Adopting a healthy lifestyle and getting preventive care are important in promoting health and wellness. Follow your health care provider's instructions about healthy diet, exercising, and getting tested or screened for diseases. Follow your health care provider's instructions on monitoring your cholesterol and blood pressure. This information is not intended to replace advice given to you by your health care provider. Make sure you discuss any questions you have with your health care provider. Document Revised: 11/04/2020 Document Reviewed:  08/20/2018 Elsevier Patient Education  2022 [employer].

## 2021-05-30 ENCOUNTER — Encounter: Payer: Self-pay | Admitting: Physician Assistant

## 2021-07-12 ENCOUNTER — Other Ambulatory Visit: Payer: Self-pay

## 2021-07-12 ENCOUNTER — Ambulatory Visit (INDEPENDENT_AMBULATORY_CARE_PROVIDER_SITE_OTHER): Payer: Medicare HMO | Admitting: Physician Assistant

## 2021-07-12 DIAGNOSIS — Z7189 Other specified counseling: Secondary | ICD-10-CM

## 2021-07-12 NOTE — Progress Notes (Signed)
Patient presented for a teaching on self injection for Ozempic. Patient was informed of proper injection technique, specific sites for injection, directions of use and storage for the medication. Patient hand out given to patient. Patient will complete the first injection (0.25 mg) today. Patient advised to contact Triage if they are not comfortable completing self injection on their own.

## 2021-07-12 NOTE — Progress Notes (Signed)
Agree with above plan and education.

## 2021-07-24 DIAGNOSIS — I10 Essential (primary) hypertension: Secondary | ICD-10-CM | POA: Diagnosis not present

## 2021-07-24 DIAGNOSIS — E119 Type 2 diabetes mellitus without complications: Secondary | ICD-10-CM | POA: Diagnosis not present

## 2021-07-24 DIAGNOSIS — H52229 Regular astigmatism, unspecified eye: Secondary | ICD-10-CM | POA: Diagnosis not present

## 2021-07-24 DIAGNOSIS — E7800 Pure hypercholesterolemia, unspecified: Secondary | ICD-10-CM | POA: Diagnosis not present

## 2021-07-24 DIAGNOSIS — H40013 Open angle with borderline findings, low risk, bilateral: Secondary | ICD-10-CM | POA: Diagnosis not present

## 2021-07-24 LAB — HM DIABETES EYE EXAM

## 2021-07-26 ENCOUNTER — Encounter: Payer: Self-pay | Admitting: Skilled Nursing Facility1

## 2021-07-26 ENCOUNTER — Other Ambulatory Visit: Payer: Self-pay

## 2021-07-26 ENCOUNTER — Encounter: Payer: Medicare HMO | Attending: Physician Assistant | Admitting: Skilled Nursing Facility1

## 2021-07-26 DIAGNOSIS — E119 Type 2 diabetes mellitus without complications: Secondary | ICD-10-CM | POA: Insufficient documentation

## 2021-07-26 NOTE — Progress Notes (Signed)
Pt states she is allergic to some hot peppers.  Pt states she lost her husband a year ago and did grief therapy but still struggles with it.  Pt state she has been doing factor meals but does not like their taste.  Pt states she has had diabetes for about 15 years.   A1C 10.5 up from 7.1 Pt states she checks her blood sugars fasting: 110-120's  Medication for DM: Ozempic Metformin  Diabetes Self-Management Education  Visit Type: First/Initial   07/26/2021  Ms. Teresa Spence, identified by name and date of birth, is a 70 y.o. female with a diagnosis of Diabetes: Type 2.   ASSESSMENT  There were no vitals taken for this visit. There is no height or weight on file to calculate BMI.   Diabetes Self-Management Education - 07/26/21 1034       Visit Information   Visit Type First/Initial      Initial Visit   Diabetes Type Type 2    Are you currently following a meal plan? No    Are you taking your medications as prescribed? Yes      Health Coping   How would you rate your overall health? Good      Psychosocial Assessment   Patient Belief/Attitude about Diabetes Motivated to manage diabetes    Self-care barriers None    Self-management support Friends;Family    Patient Concerns Nutrition/Meal planning    Special Needs None    Preferred Learning Style Visual    Learning Readiness Contemplating    How often do you need to have someone help you when you read instructions, pamphlets, or other written materials from your doctor or pharmacy? 1 - Never      Pre-Education Assessment   Patient understands the diabetes disease and treatment process. Needs Instruction    Patient understands incorporating nutritional management into lifestyle. Needs Instruction    Patient undertands incorporating physical activity into lifestyle. Needs Instruction    Patient understands using medications safely. Needs Instruction    Patient understands monitoring blood glucose, interpreting and  using results Needs Instruction    Patient understands prevention, detection, and treatment of acute complications. Needs Instruction    Patient understands prevention, detection, and treatment of chronic complications. Needs Instruction    Patient understands how to develop strategies to address psychosocial issues. Needs Instruction    Patient understands how to develop strategies to promote health/change behavior. Needs Instruction      Complications   Last HgB A1C per patient/outside source 10.5 %    How often do you check your blood sugar? 1-2 times/day    Fasting Blood glucose range (mg/dL) 70-129    Number of hypoglycemic episodes per month 0    Number of hyperglycemic episodes per week 0    Have you had a dilated eye exam in the past 12 months? Yes    Have you had a dental exam in the past 12 months? No    Are you checking your feet? Yes    How many days per week are you checking your feet? 7      Dietary Intake   Breakfast english muffin with sausage from [employer]    Lunch [employer] small sub seomtimes with a pickle    Snack (afternoon) corn chips    Dinner factor meal or chili or sloppy joe    Snack (evening) cheese stick    Beverage(s) black coffee, unsweet tea, water, wine      Exercise  Exercise Type ADL's;Light (walking / raking leaves)    How many days per week to you exercise? 3    How many minutes per day do you exercise? 15    Total minutes per week of exercise 45      Patient Education   Previous Diabetes Education No    Disease state  Factors that contribute to the development of diabetes    Acute complications Taught treatment of hypoglycemia - the 15 rule.    Chronic complications Retinopathy and reason for yearly dilated eye exams;Dental care    Psychosocial adjustment Role of stress on diabetes;Worked with patient to identify barriers to care and solutions    Personal strategies to promote health Lifestyle issues that need to be addressed for better  diabetes care      Individualized Goals (developed by patient)   Nutrition Follow meal plan discussed;General guidelines for healthy choices and portions discussed    Physical Activity Exercise 5-7 days per week;30 minutes per day    Medications take my medication as prescribed    Monitoring  test my blood glucose as discussed;test blood glucose pre and post meals as discussed      Post-Education Assessment   Patient understands the diabetes disease and treatment process. Demonstrates understanding / competency    Patient understands incorporating nutritional management into lifestyle. Demonstrates understanding / competency    Patient undertands incorporating physical activity into lifestyle. Demonstrates understanding / competency    Patient understands using medications safely. Demonstrates understanding / competency    Patient understands monitoring blood glucose, interpreting and using results Demonstrates understanding / competency    Patient understands prevention, detection, and treatment of acute complications. Demonstrates understanding / competency    Patient understands prevention, detection, and treatment of chronic complications. Demonstrates understanding / competency    Patient understands how to develop strategies to address psychosocial issues. Demonstrates understanding / competency    Patient understands how to develop strategies to promote health/change behavior. Demonstrates understanding / competency      Outcomes   Expected Outcomes Demonstrated interest in learning. Expect positive outcomes    Future DMSE PRN    Program Status Completed             Individualized Plan for Diabetes Self-Management Training:   Learning Objective:  Patient will have a greater understanding of diabetes self-management. Patient education plan is to attend individual and/or group sessions per assessed needs and concerns.    Expected Outcomes:  Demonstrated interest in learning.  Expect positive outcomes  Education material provided: ADA - How to Thrive: A Guide for Your Journey with Diabetes, My Plate, and Snack sheet  If problems or questions, patient to contact team via:  Phone and Email  Future DSME appointment: PRN

## 2021-08-25 ENCOUNTER — Ambulatory Visit: Payer: Medicare HMO | Admitting: Physician Assistant

## 2021-08-28 ENCOUNTER — Other Ambulatory Visit: Payer: Self-pay

## 2021-08-28 ENCOUNTER — Ambulatory Visit (INDEPENDENT_AMBULATORY_CARE_PROVIDER_SITE_OTHER): Payer: Medicare HMO | Admitting: Physician Assistant

## 2021-08-28 ENCOUNTER — Encounter: Payer: Self-pay | Admitting: Physician Assistant

## 2021-08-28 VITALS — BP 125/68 | HR 83 | Ht 65.5 in | Wt 179.0 lb

## 2021-08-28 DIAGNOSIS — I10 Essential (primary) hypertension: Secondary | ICD-10-CM | POA: Diagnosis not present

## 2021-08-28 DIAGNOSIS — E1142 Type 2 diabetes mellitus with diabetic polyneuropathy: Secondary | ICD-10-CM

## 2021-08-28 DIAGNOSIS — E785 Hyperlipidemia, unspecified: Secondary | ICD-10-CM | POA: Diagnosis not present

## 2021-08-28 DIAGNOSIS — E119 Type 2 diabetes mellitus without complications: Secondary | ICD-10-CM

## 2021-08-28 DIAGNOSIS — N182 Chronic kidney disease, stage 2 (mild): Secondary | ICD-10-CM | POA: Insufficient documentation

## 2021-08-28 LAB — POCT GLYCOSYLATED HEMOGLOBIN (HGB A1C): Hemoglobin A1C: 7 % — AB (ref 4.0–5.6)

## 2021-08-28 MED ORDER — OZEMPIC (0.25 OR 0.5 MG/DOSE) 2 MG/1.5ML ~~LOC~~ SOPN: 0.5000 mg | PEN_INJECTOR | SUBCUTANEOUS | 0 refills | Status: DC

## 2021-08-28 NOTE — Progress Notes (Signed)
Subjective:    Patient ID: Teresa Spence, female    DOB: 08/24/1951, 70 y.o.   MRN: [id number]  HPI Pt is a 70 yo female with T2DM, HTN, HLD who presents to the clinic for follow up and medication refills.   She started the ozempic .25mg  and doing great. She denies any side effects. She has lost 9lbs. She denies any hypoglycemic events. She is not checking her sugars. She is making better diet choices. No open wounds. Denies any CP, palpitations, headaches or vision changes.    .. 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 12/27/2016   Palpitations 12/27/2016   Cellulitis of toe of left foot 12/29/2016   Blue toes 12/29/2016   Absent pedal pulses 12/29/2016   Choking 05/08/2017   Numbness and tingling in both hands 05/08/2017   Primary osteoarthritis involving multiple joints 05/08/2017   Toenail fungus 10/25/2017   Class 1 obesity due to excess calories with serious comorbidity and body mass index (BMI) of 30.0 to 30.9 in adult 11/05/2017   Hyperlipidemia associated with type 2 diabetes mellitus ([place]) 05/06/2018   Seborrheic keratoses 05/06/2018   Dermoid cyst of right ear 05/06/2018   Chest tightness 05/06/2018   Colon polyps 05/21/2018   Generalized osteoarthritis of hand 09/11/2018   Neuropathy 06/03/2019   Post-menopausal 06/03/2019   Tinnitus, left 09/14/2019   ETD (Eustachian tube dysfunction), bilateral 09/14/2019   DDD (degenerative disc disease), cervical 12/01/2019   Fine motor skill loss 12/01/2019   Bilateral carpal tunnel syndrome 01/20/2020   Open toe wound, sequela 08/23/2020   Hyperlipidemia LDL goal <70 08/23/2020   Toe ulcer, left, with necrosis of muscle ([place]) 08/24/2020   Resolved Ambulatory Problems    Diagnosis Date Noted   No Resolved Ambulatory Problems   Past Medical History:  Diagnosis Date   Diabetes mellitus without complication  ([place])    Hypertension     Review of Systems    See HPI.  Objective:   Physical Exam Vitals reviewed.  Constitutional:      Appearance: Normal appearance. She is obese.  HENT:     Head: Normocephalic.  Cardiovascular:     Rate and Rhythm: Normal rate and regular rhythm.     Pulses: Normal pulses.  Pulmonary:     Effort: Pulmonary effort is normal.     Breath sounds: Normal breath sounds.  Musculoskeletal:     Right lower leg: No edema.     Left lower leg: No edema.  Neurological:     General: No focal deficit present.     Mental Status: She is alert and oriented to person, place, and time.  Psychiatric:        Mood and Affect: Mood normal.  .. Results for orders placed or performed in visit on 08/28/21  POCT glycosylated hemoglobin (Hb A1C)  Result Value Ref Range   Hemoglobin A1C 7.0 (A) 4.0 - 5.6 %   HbA1c POC (<> result, manual entry)     HbA1c, POC (prediabetic range)     HbA1c, POC (controlled diabetic range)             Assessment & Plan:  [name] KitchenMarland KitchenLatana was seen today for diabetes.  Diagnoses and all orders for this visit:  Type 2 diabetes mellitus with diabetic polyneuropathy, without long-term current use of insulin (HCC) -     POCT glycosylated hemoglobin (Hb A1C) -  Semaglutide,0.25 or 0.5MG /DOS, (OZEMPIC, 0.25 OR 0.5 MG/DOSE,) 2 MG/1.5ML SOPN; Inject 0.5 mg into the skin once a week.  CKD (chronic kidney disease) stage 2, GFR 60-89 ml/min  Essential hypertension  Hyperlipidemia LDL goal <70   A1C made a huge improvement from 10 to 7.0.  Increased ozempic to .5mg  weekly.  Continue on metformin.  BP great on second recheck.  On statin.  Pneumonia/flu UtD.  Covid vaccine x2. Needs booster.  Continue to work on weight loss and exercise.  Follow up in 3 months.

## 2021-09-05 DIAGNOSIS — Z0100 Encounter for examination of eyes and vision without abnormal findings: Secondary | ICD-10-CM | POA: Diagnosis not present

## 2021-09-12 DIAGNOSIS — H40053 Ocular hypertension, bilateral: Secondary | ICD-10-CM | POA: Diagnosis not present

## 2021-10-17 DIAGNOSIS — E1142 Type 2 diabetes mellitus with diabetic polyneuropathy: Secondary | ICD-10-CM | POA: Diagnosis not present

## 2021-10-17 DIAGNOSIS — Z8269 Family history of other diseases of the musculoskeletal system and connective tissue: Secondary | ICD-10-CM | POA: Diagnosis not present

## 2021-10-17 DIAGNOSIS — Z720 Tobacco use: Secondary | ICD-10-CM | POA: Diagnosis not present

## 2021-10-17 DIAGNOSIS — M1990 Unspecified osteoarthritis, unspecified site: Secondary | ICD-10-CM | POA: Diagnosis not present

## 2021-10-17 DIAGNOSIS — R32 Unspecified urinary incontinence: Secondary | ICD-10-CM | POA: Diagnosis not present

## 2021-10-17 DIAGNOSIS — Z825 Family history of asthma and other chronic lower respiratory diseases: Secondary | ICD-10-CM | POA: Diagnosis not present

## 2021-10-17 DIAGNOSIS — Z7985 Long-term (current) use of injectable non-insulin antidiabetic drugs: Secondary | ICD-10-CM | POA: Diagnosis not present

## 2021-10-17 DIAGNOSIS — K219 Gastro-esophageal reflux disease without esophagitis: Secondary | ICD-10-CM | POA: Diagnosis not present

## 2021-10-17 DIAGNOSIS — E785 Hyperlipidemia, unspecified: Secondary | ICD-10-CM | POA: Diagnosis not present

## 2021-10-17 DIAGNOSIS — G8929 Other chronic pain: Secondary | ICD-10-CM | POA: Diagnosis not present

## 2021-10-17 DIAGNOSIS — I10 Essential (primary) hypertension: Secondary | ICD-10-CM | POA: Diagnosis not present

## 2021-10-17 DIAGNOSIS — Z7984 Long term (current) use of oral hypoglycemic drugs: Secondary | ICD-10-CM | POA: Diagnosis not present

## 2021-10-30 ENCOUNTER — Other Ambulatory Visit: Payer: Self-pay | Admitting: Physician Assistant

## 2021-10-30 DIAGNOSIS — E1142 Type 2 diabetes mellitus with diabetic polyneuropathy: Secondary | ICD-10-CM

## 2021-11-23 ENCOUNTER — Other Ambulatory Visit: Payer: Self-pay | Admitting: Physician Assistant

## 2021-11-25 ENCOUNTER — Other Ambulatory Visit: Payer: Self-pay | Admitting: Physician Assistant

## 2021-12-06 DIAGNOSIS — L03032 Cellulitis of left toe: Secondary | ICD-10-CM | POA: Diagnosis not present

## 2021-12-06 DIAGNOSIS — L97522 Non-pressure chronic ulcer of other part of left foot with fat layer exposed: Secondary | ICD-10-CM | POA: Diagnosis not present

## 2022-01-03 DIAGNOSIS — M2042 Other hammer toe(s) (acquired), left foot: Secondary | ICD-10-CM | POA: Diagnosis not present

## 2022-01-03 DIAGNOSIS — M25572 Pain in left ankle and joints of left foot: Secondary | ICD-10-CM | POA: Diagnosis not present

## 2022-01-05 ENCOUNTER — Encounter: Payer: Self-pay | Admitting: *Deleted

## 2022-01-05 DIAGNOSIS — Z006 Encounter for examination for normal comparison and control in clinical research program: Secondary | ICD-10-CM

## 2022-01-05 NOTE — Research (Signed)
Spoke with Teresa Spence about [employer]. She states that she is not really taking Lipitor right now. Encouraged her to call if that changes.  ?

## 2022-01-23 DIAGNOSIS — Z0100 Encounter for examination of eyes and vision without abnormal findings: Secondary | ICD-10-CM | POA: Diagnosis not present

## 2022-02-15 DIAGNOSIS — Z7984 Long term (current) use of oral hypoglycemic drugs: Secondary | ICD-10-CM | POA: Diagnosis not present

## 2022-02-15 DIAGNOSIS — M2042 Other hammer toe(s) (acquired), left foot: Secondary | ICD-10-CM | POA: Diagnosis not present

## 2022-02-15 DIAGNOSIS — I10 Essential (primary) hypertension: Secondary | ICD-10-CM | POA: Diagnosis not present

## 2022-02-15 DIAGNOSIS — M25572 Pain in left ankle and joints of left foot: Secondary | ICD-10-CM | POA: Diagnosis not present

## 2022-02-15 DIAGNOSIS — Z79899 Other long term (current) drug therapy: Secondary | ICD-10-CM | POA: Diagnosis not present

## 2022-02-15 DIAGNOSIS — Z888 Allergy status to other drugs, medicaments and biological substances status: Secondary | ICD-10-CM | POA: Diagnosis not present

## 2022-02-15 DIAGNOSIS — E119 Type 2 diabetes mellitus without complications: Secondary | ICD-10-CM | POA: Diagnosis not present

## 2022-02-26 ENCOUNTER — Other Ambulatory Visit: Payer: Self-pay | Admitting: Physician Assistant

## 2022-02-28 IMAGING — MR MR CERVICAL SPINE W/O CM
4 of 5 series · 18 of 48 positions shown · non-contrast
Comparison: Cervical spine radiographs 10/28/2019.

CLINICAL DATA: 68-year-old female with cervical spine degeneration.
New numbness and tingling in both hands. Weakness, decreased
dexterity for 2 months.

EXAM:
MRI CERVICAL SPINE WITHOUT CONTRAST
TECHNIQUE: Multiplanar, multisequence MR imaging of the cervical spine was
performed. No intravenous contrast was administered.

[Series 6: T2 · sagittal · 3.0mm · 0.47mm/px · 8 of 15 slices shown (1 of 2)]
[im 1/15]
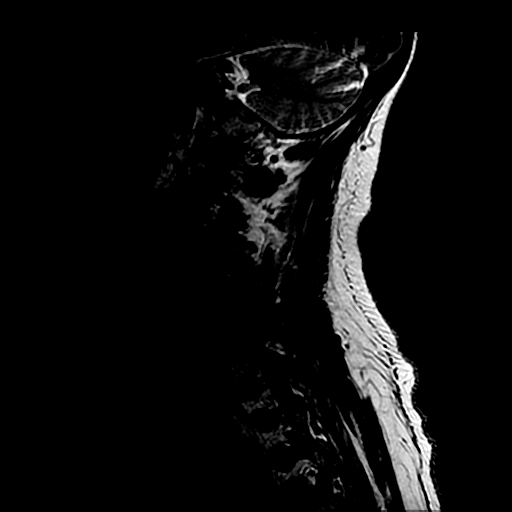
[im 3/15]
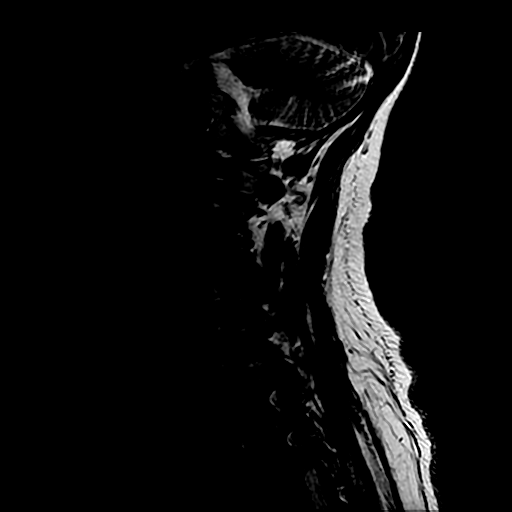
[im 5/15]
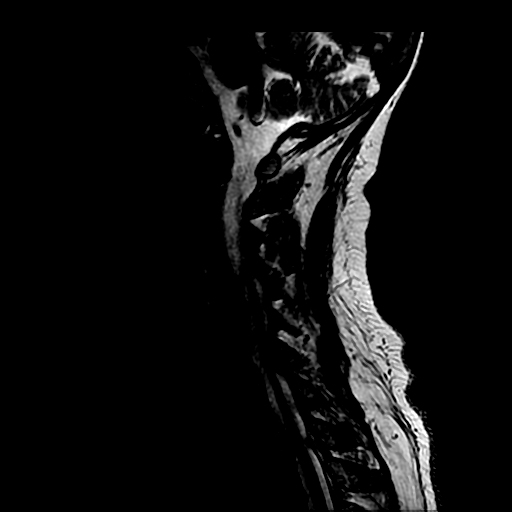
[im 7/15]
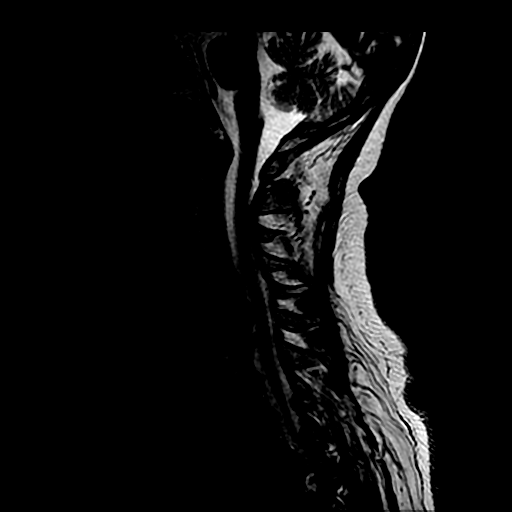
[im 9/15]
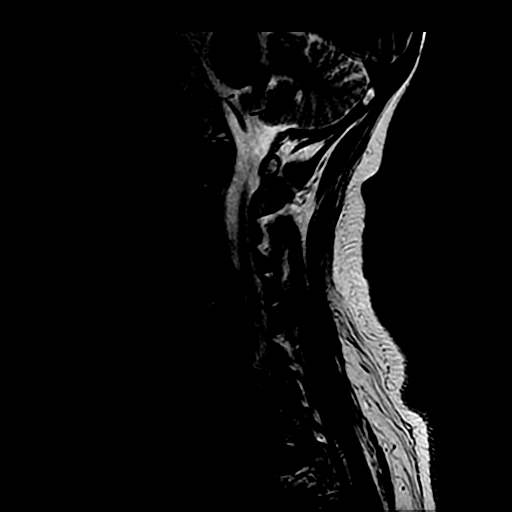
[im 11/15]
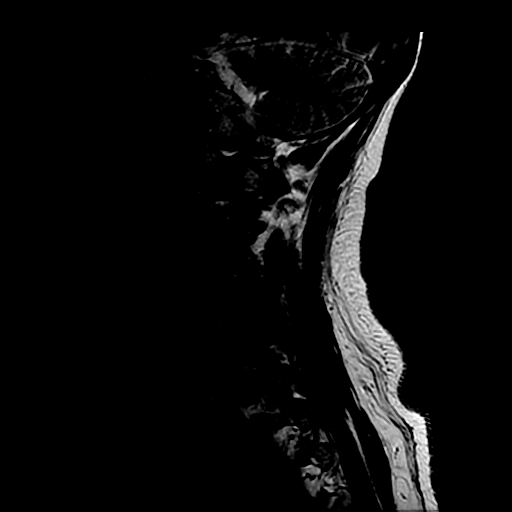
[im 13/15]
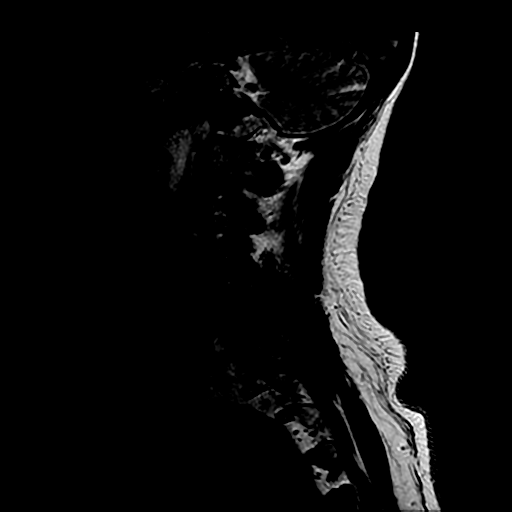
[im 15/15]
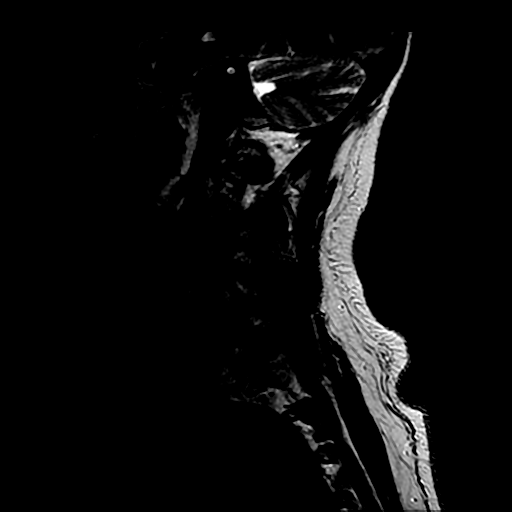

[Series 7: T1 · sagittal · 3.0mm · 0.47mm/px · 3 of 15 slices shown]
[im 3/15]
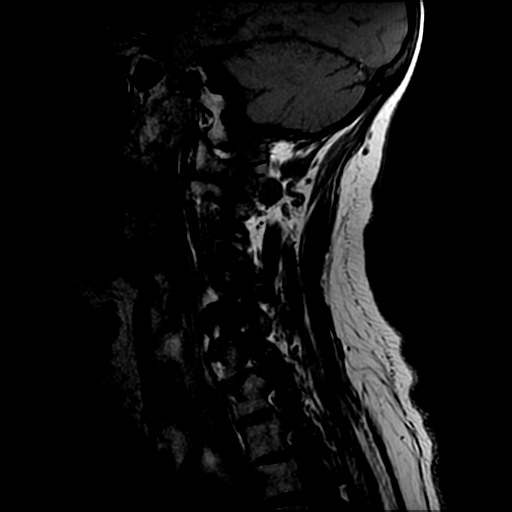
[im 8/15]
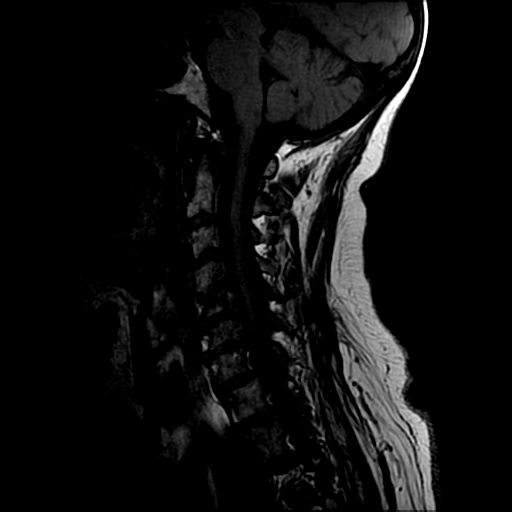
[im 12/15]
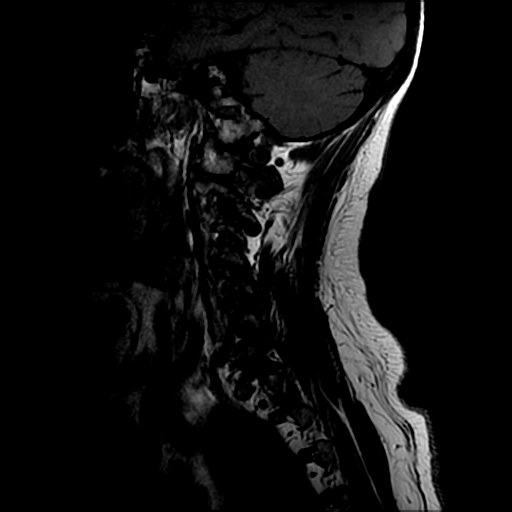

[Series 8: sag ir · sagittal · 3.0mm · 0.47mm/px · 3 of 15 slices shown]
[im 3/15]
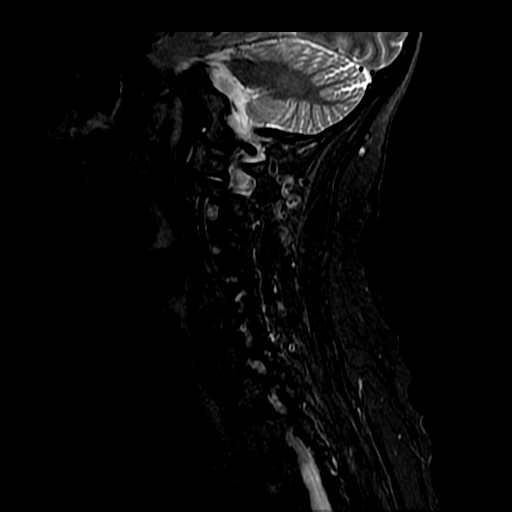
[im 8/15]
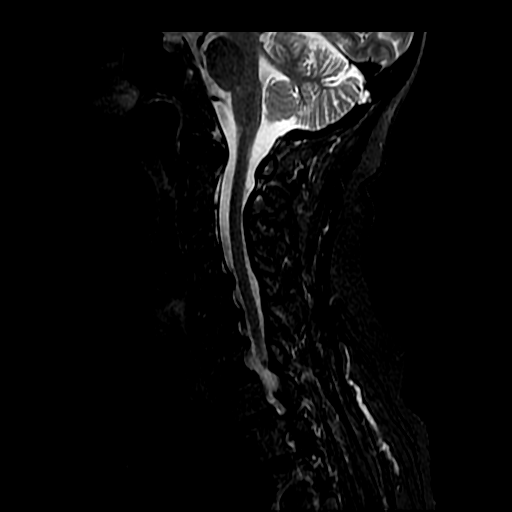
[im 12/15]
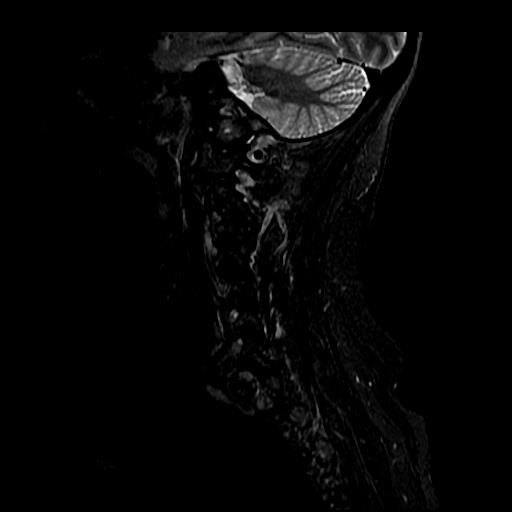

[Series 9: T2 · axial · 3.0mm · 0.39mm/px · z∈[-64,+11]mm · 4 of 28 slices shown (2 of 2)]
[im 1/28]
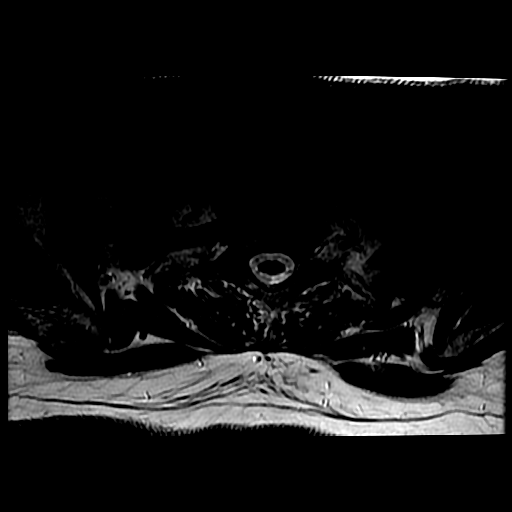
[im 5/28]
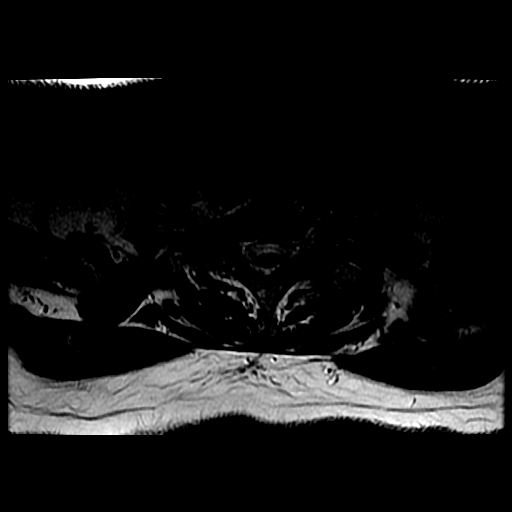
[im 14/28]
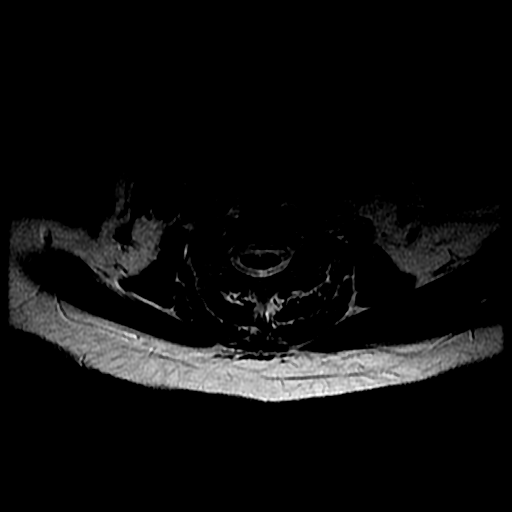
[im 23/28]
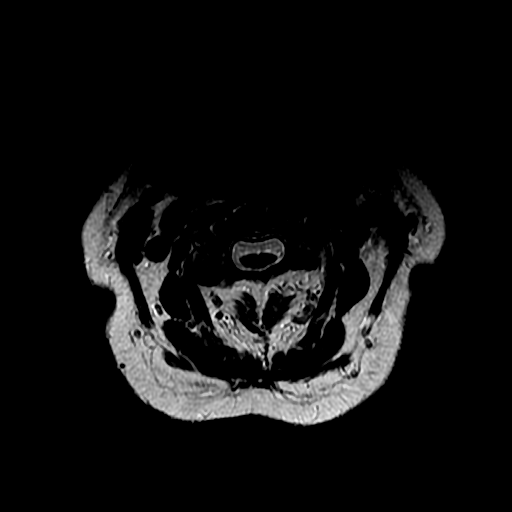

[18 of 48 positions shown; findings below may reference images not displayed]

FINDINGS: Alignment: Stable since [REDACTED]. Straightening of cervical lordosis
with mild degenerative appearing anterolisthesis at both C4-C5 and
C7-T1.

Vertebrae: Ankylosis of the left C3-C4 facets (series 6, image 14).
No marrow edema or evidence of acute osseous abnormality. Normal
background bone marrow signal. Degenerative endplate marrow signal
changes in the cervical spine.

Cord: Spinal cord signal is within normal limits at all visualized
levels.

Posterior Fossa, vertebral arteries, paraspinal tissues:
Cervicomedullary junction is within normal limits. There is a small
chronic infarct in the inferior right cerebellum on series 6, image
6. Otherwise negative visible brain parenchyma.

Preserved major vascular flow voids in the neck. Negative neck soft
tissues and right lung apex.

Disc levels:

C2-C3: Moderate facet hypertrophy greater on the right. Ligament
flavum hypertrophy. Mild right C3 foraminal stenosis.

C3-C4: Ankylosis of the left facet. Hypertrophied facets with
moderate to severe left and moderate right C4 foraminal stenosis.

C4-C5: Mild anterolisthesis with circumferential disc osteophyte
complex and moderate to severe facet hypertrophy greater on the
left. Broad-based posterior disc with no significant spinal
stenosis. Moderate to severe left and mild right C5 foraminal
stenosis.

C5-C6: Disc space loss with bulky circumferential disc osteophyte
complex. Broad-based posterior component. Mild spinal stenosis and
cord mass effect. Moderate to severe left and moderate right C6
foraminal stenosis.

C6-C7: Disc space loss with bulky circumferential disc osteophyte
complex. Lobulated posterior component. Mild facet hypertrophy. Mild
spinal stenosis. No cord mass effect. Moderate left and moderate to
severe right C7 foraminal stenosis.

C7-T1: Mild anterolisthesis with moderate to severe facet and
ligament flavum hypertrophy. Minimal disc bulge. No spinal stenosis.
Mild bilateral C8 foraminal stenosis.

There is mild stenosis in the visible upper thoracic spine at T2-T3
related to disc bulge or protrusion and moderate to severe posterior
element hypertrophy.
IMPRESSION: 1. No acute osseous abnormality in the cervical spine. Ankylosis of
the left C3-C4 facets with degenerative anterolisthesis at C4-C5 and
C7-T1.

2. Widespread cervical spine degeneration. Mild degenerative spinal
stenosis at C5-C6 and C6-C7. Mild cord mass effect at the former
with no cord signal abnormality.

3. Moderate or severe degenerative neural foraminal stenosis at the
bilateral C4, left C5, bilateral C6, and C7 nerve levels.

4. Mild multifactorial degenerative spinal stenosis in the upper
thoracic spine at T2-T3.

## 2022-03-10 ENCOUNTER — Other Ambulatory Visit: Payer: Self-pay | Admitting: Physician Assistant

## 2022-03-27 ENCOUNTER — Other Ambulatory Visit: Payer: Self-pay | Admitting: Physician Assistant

## 2022-03-27 NOTE — Telephone Encounter (Signed)
Patient has been scheduled for [name]'s next Open appointment on 04/23/22. AMUCK

## 2022-03-27 NOTE — Telephone Encounter (Signed)
Sent two weeks of Metformin. Needs appt.

## 2022-04-08 ENCOUNTER — Other Ambulatory Visit: Payer: Self-pay | Admitting: Physician Assistant

## 2022-04-23 ENCOUNTER — Encounter: Payer: Self-pay | Admitting: Physician Assistant

## 2022-04-23 ENCOUNTER — Ambulatory Visit (INDEPENDENT_AMBULATORY_CARE_PROVIDER_SITE_OTHER): Payer: Medicare HMO | Admitting: Physician Assistant

## 2022-04-23 VITALS — BP 138/76 | HR 76 | Ht 65.5 in | Wt 180.0 lb

## 2022-04-23 DIAGNOSIS — E785 Hyperlipidemia, unspecified: Secondary | ICD-10-CM

## 2022-04-23 DIAGNOSIS — I10 Essential (primary) hypertension: Secondary | ICD-10-CM | POA: Diagnosis not present

## 2022-04-23 DIAGNOSIS — Z1329 Encounter for screening for other suspected endocrine disorder: Secondary | ICD-10-CM

## 2022-04-23 DIAGNOSIS — E1142 Type 2 diabetes mellitus with diabetic polyneuropathy: Secondary | ICD-10-CM

## 2022-04-23 DIAGNOSIS — G5603 Carpal tunnel syndrome, bilateral upper limbs: Secondary | ICD-10-CM

## 2022-04-23 DIAGNOSIS — Z79899 Other long term (current) drug therapy: Secondary | ICD-10-CM | POA: Diagnosis not present

## 2022-04-23 LAB — POCT GLYCOSYLATED HEMOGLOBIN (HGB A1C): HbA1c, POC (controlled diabetic range): 6 % (ref 0.0–7.0)

## 2022-04-23 LAB — POCT UA - MICROALBUMIN
Albumin/Creatinine Ratio, Urine, POC: <30
Creatinine, POC: 200 mg/dL
Microalbumin Ur, POC: 30 mg/L

## 2022-04-23 MED ORDER — METFORMIN HCL 1000 MG PO TABS: ORAL_TABLET | ORAL | 3 refills | Status: DC

## 2022-04-23 MED ORDER — OZEMPIC (0.25 OR 0.5 MG/DOSE) 2 MG/3ML ~~LOC~~ SOPN: 0.5000 mg | PEN_INJECTOR | SUBCUTANEOUS | 5 refills | Status: DC

## 2022-04-23 NOTE — Progress Notes (Signed)
   Established Patient Office Visit  Subjective   Patient ID: Teresa Spence, female    DOB: [birth year]  Age: 71 y.o. MRN: [id number]  Chief Complaint  Patient presents with  . Diabetes  . Follow-up    HPI  {History (Optional):23778}  ROS    Objective:     BP (!) 148/71   Pulse 76   Ht 5' 5.5" (1.664 m)   Wt 180 lb (81.6 kg)   SpO2 98%   BMI 29.50 kg/m  BP Readings from Last 3 Encounters:  04/23/22 (!) 148/71  08/28/21 125/68  05/26/21 131/72   Wt Readings from Last 3 Encounters:  04/23/22 180 lb (81.6 kg)  08/28/21 179 lb (81.2 kg)  05/26/21 186 lb 11.2 oz (84.7 kg)      Physical Exam   Results for orders placed or performed in visit on 04/23/22  POCT glycosylated hemoglobin (Hb A1C)  Result Value Ref Range   Hemoglobin A1C     HbA1c POC (<> result, manual entry)     HbA1c, POC (prediabetic range)     HbA1c, POC (controlled diabetic range) 6.0 0.0 - 7.0 %  POCT UA - Microalbumin  Result Value Ref Range   Microalbumin Ur, POC 30 mg/L   Creatinine, POC 200 mg/dL   Albumin/Creatinine Ratio, Urine, POC <30       Assessment & Plan:     No follow-ups on file.    Iran Planas, PA-C

## 2022-04-23 NOTE — Patient Instructions (Addendum)
Will make referral to hand surgeon.  Will get labs  Piriformis Syndrome Rehab Ask your health care provider which exercises are safe for you. Do exercises exactly as told by your health care provider and adjust them as directed. It is normal to feel mild stretching, pulling, tightness, or discomfort as you do these exercises. Stop right away if you feel sudden pain or your pain gets worse. Do not begin these exercises until told by your health care provider. Stretching and range-of-motion exercises These exercises warm up your muscles and joints and improve the movement and flexibility of your hip and pelvis. The exercises also help to relieve pain, numbness, and tingling. Nerve root  Sit on a firm surface that is high enough that you can swing your left / right foot freely. Place a folded towel under your left / right thigh. This is optional. Drop your head forward and round your back. While you keep your left / right foot relaxed, slowly straighten your left / right knee until you feel a slight pull behind your knee or calf. If your leg is fully extended and you still do not feel a pull, slowly tilt your foot and toes toward you. Hold this position for __________ seconds. Slowly return your knee to its starting position. Hip rotation This is an exercise in which you lie on your back and stretch the muscles that rotate your hip (hip rotators) to stretch your buttocks. Lie on your back on a firm surface. Pull your left / right knee toward your same shoulder with your left / right hand until your knee is pointing toward the ceiling. Hold your left / right ankle with your other hand. Keeping your knee steady, gently pull your left / right ankle toward your other shoulder until you feel a stretch in your buttocks. Hold this position for __________ seconds. Repeat __________ times. Complete this exercise __________ times a day. Hip extensor This is an exercise in which you lie on your back and  pull your knee to your chest. Lie on your back on a firm surface. Both of your legs should be straight. Pull your left / right knee to your chest. Hold your leg in this position by holding on to the back of your thigh or the front of your knee. Hold this position for __________ seconds. Slowly return to the starting position. Repeat __________ times. Complete this exercise __________ times a day. Strengthening exercises These exercises build strength and endurance in your hip and thigh muscles. Endurance is the ability to use your muscles for a long time, even after they get tired. Straight leg raises, side-lying This exercise strengthens the muscles that rotate the leg at the hip and move it away from your body (hip abductors). Lie on your side with your left / right leg in the top position. Lie so your head, shoulder, knee, and hip line up. Bend your bottom knee to help you balance. Lift your top leg 4-6 inches (10-15 cm) while keeping your toes pointed straight ahead. Hold this position for __________ seconds. Slowly lower your leg to the starting position. Let your muscles relax completely after each repetition. Repeat __________ times. Complete this exercise __________ times a day. Hip abduction and rotation This is sometimes called quadruped (on hands and knees) exercises. Get on your hands and knees on a firm, lightly padded surface. Your hands should be directly below your shoulders, and your knees should be directly below your hips. Lift your left / right knee  out to the side. Keep your knee bent. Do not twist your body. Hold this position for __________ seconds. Slowly lower your leg. Repeat __________ times. Complete this exercise __________ times a day. Straight leg raises, prone This exercise stretches the muscles that move the hips (hip extensors). Lie on your abdomen on a firm surface (prone position). Tense the muscles in your buttocks and lift your left / right leg about 4  inches (10 cm). Keep your knee straight as you lift your leg. If you cannot lift your leg that high without arching your back, place a pillow under your hips. Hold this position for __________ seconds. Slowly lower your leg to the starting position. Let your muscles relax completely after each repetition. Repeat __________ times. Complete this exercise __________ times a day. This information is not intended to replace advice given to you by your health care provider. Make sure you discuss any questions you have with your health care provider. Document Revised: 02/28/2021 Document Reviewed: 02/28/2021 Elsevier Patient Education  [place].

## 2022-04-25 ENCOUNTER — Encounter: Payer: Self-pay | Admitting: Physician Assistant

## 2022-05-09 DIAGNOSIS — G5622 Lesion of ulnar nerve, left upper limb: Secondary | ICD-10-CM | POA: Diagnosis not present

## 2022-05-09 DIAGNOSIS — G5603 Carpal tunnel syndrome, bilateral upper limbs: Secondary | ICD-10-CM | POA: Diagnosis not present

## 2022-05-09 DIAGNOSIS — G5602 Carpal tunnel syndrome, left upper limb: Secondary | ICD-10-CM | POA: Diagnosis not present

## 2022-05-25 ENCOUNTER — Other Ambulatory Visit: Payer: Self-pay | Admitting: Physician Assistant

## 2022-05-25 DIAGNOSIS — Z13820 Encounter for screening for osteoporosis: Secondary | ICD-10-CM

## 2022-05-25 DIAGNOSIS — Z0000 Encounter for general adult medical examination without abnormal findings: Secondary | ICD-10-CM

## 2022-05-25 DIAGNOSIS — Z1231 Encounter for screening mammogram for malignant neoplasm of breast: Secondary | ICD-10-CM

## 2022-05-26 ENCOUNTER — Other Ambulatory Visit: Payer: Self-pay | Admitting: Physician Assistant

## 2022-05-26 DIAGNOSIS — E1169 Type 2 diabetes mellitus with other specified complication: Secondary | ICD-10-CM

## 2022-05-26 DIAGNOSIS — I10 Essential (primary) hypertension: Secondary | ICD-10-CM

## 2022-06-04 ENCOUNTER — Ambulatory Visit (INDEPENDENT_AMBULATORY_CARE_PROVIDER_SITE_OTHER): Payer: Medicare HMO | Admitting: Physician Assistant

## 2022-06-04 DIAGNOSIS — Z0000 Encounter for general adult medical examination without abnormal findings: Secondary | ICD-10-CM | POA: Diagnosis not present

## 2022-06-04 NOTE — Progress Notes (Signed)
MEDICARE ANNUAL WELLNESS VISIT  06/04/2022  Telephone Visit Disclaimer This Medicare AWV was conducted by telephone due to national recommendations for restrictions regarding the COVID-19 Pandemic (e.g. social distancing).  I verified, using two identifiers, that I am speaking with Teresa Spence or their authorized healthcare agent. I discussed the limitations, risks, security, and privacy concerns of performing an evaluation and management service by telephone and the potential availability of an in-person appointment in the future. The patient expressed understanding and agreed to proceed.  Location of Patient: Home Location of Provider (nurse): In the office.  Subjective:    Teresa Spence is a 71 y.o. female patient of Donella Stade, PA-C who had a [employer] Visit today via telephone. Teresa Spence is Retired and lives alone. she has 1 child. she reports that she is socially active and does interact with friends/family regularly. she is minimally physically active and enjoys reading, gardening, knitting and painting.  Patient Care Team: Lavada Mesi as PCP - General (Family Medicine)     06/04/2022    1:21 PM 05/29/2021   11:00 AM 10/13/2019    9:13 AM 12/27/2016    1:38 PM  Advanced Directives  Does Patient Have a Medical Advance Directive? Yes Yes Yes Yes  Type of Advance Directive Living will Living will [place];Living will [place]  Does patient want to make changes to medical advance directive? No - Patient declined No - Patient declined No - Patient declined   Copy of [place] in Chart?   No - copy requested     Hospital Utilization Over the Past 12 Months: # of hospitalizations or ER visits: 0 # of surgeries: 1  Review of Systems    Patient reports that her overall health is better compared to last year.  History obtained from chart review and the patient  Patient Reported Readings (BP,  Pulse, CBG, Weight, etc) none  Pain Assessment Pain : 0-10 Pain Score: 5  Pain Type: Acute pain Pain Location:  (Carpel tunnel in wrist and nerve in elbow) Pain Orientation: Right, Left Pain Descriptors / Indicators: Burning Pain Onset: More than a month ago Pain Frequency: Constant     Current Medications & Allergies (verified) Allergies as of 06/04/2022       Reactions   Lamisil [terbinafine]    Stomach upset and Diarrhea with excess gas in tummy.         Medication List        Accurate as of June 04, 2022  1:46 PM. If you have any questions, ask your nurse or doctor.          atorvastatin 40 MG tablet Commonly known as: LIPITOR Take 1 tablet (40 mg total) by mouth daily. Labs for refills   cholecalciferol 25 MCG (1000 UNIT) tablet Commonly known as: VITAMIN D3 Take 1,000 Units by mouth daily.   losartan-hydrochlorothiazide 100-25 MG tablet Commonly known as: HYZAAR Take 1 tablet by mouth daily. Labs for refills   magnesium 30 MG tablet Take 30 mg by mouth 2 (two) times daily.   metFORMIN 1000 MG tablet Commonly known as: GLUCOPHAGE TAKE 1 TABLET BY MOUTH TWICE A DAY WITH MEALS   Ozempic (0.25 or 0.5 MG/DOSE) 2 MG/3ML Sopn Generic drug: Semaglutide(0.25 or 0.'5MG'$ /DOS) Inject 0.5 mg into the skin once a week.   vitamin C 100 MG tablet Take 100 mg by mouth daily.        History (reviewed):  Past Medical History:  Diagnosis Date   Allergy 1975   Environmental   Arthritis 1985   Bilateral carpal tunnel syndrome 01/20/2020   Diabetes mellitus without complication (HCC)    Heart murmur 105w   Hypertension    Past Surgical History:  Procedure Laterality Date   BUNIONECTOMY Right 06/10/2019   HAMMER TOE SURGERY     JOINT REPLACEMENT  2015   MEDIAL PARTIAL KNEE REPLACEMENT     TONSILLECTOMY     Family History  Problem Relation Age of Onset   Diabetes Mother    Hypertension Mother    Arthritis Mother    Hypertension Father    COPD  Father    Heart attack Brother    Stroke Maternal Grandmother    Heart attack Maternal Grandfather    Emphysema Neg Hx    Social History   Socioeconomic History   Marital status: Widowed    Spouse name: [name]   Number of children: 1   Years of education: 17   Highest education level: Bachelor's degree (e.g., BA, AB, BS)  Occupational History   Occupation: Optometrist    Comment: retired  Tobacco Use   Smoking status: Former   Smokeless tobacco: Never  Scientific laboratory technician Use: Never used  Substance and Sexual Activity   Alcohol use: Yes    Alcohol/week: 1.0 standard drink of alcohol    Types: 1 Glasses of wine per week    Comment: 1 glass a month   Drug use: No   Sexual activity: Not Currently  Other Topics Concern   Not on file  Social History Narrative   Lives alone. She has one child. She enjoys reading, gardening, knitting and painting in her free time.   Social Determinants of Health   Financial Resource Strain: Low Risk  (06/01/2022)   Overall Financial Resource Strain (CARDIA)    Difficulty of Paying Living Expenses: Not hard at all  Food Insecurity: No Food Insecurity (06/01/2022)   Hunger Vital Sign    Worried About Running Out of Food in the Last Year: Never true    Ran Out of Food in the Last Year: Never true  Transportation Needs: No Transportation Needs (06/01/2022)   PRAPARE - Hydrologist (Medical): No    Lack of Transportation (Non-Medical): No  Physical Activity: Insufficiently Active (06/01/2022)   Exercise Vital Sign    Days of Exercise per Week: 2 days    Minutes of Exercise per Session: 20 min  Stress: No Stress Concern Present (06/01/2022)   [place]    Feeling of Stress : Not at all  Social Connections: Moderately Integrated (06/04/2022)   Social Connection and Isolation Panel [NHANES]    Frequency of Communication with Friends and Family: More than  three times a week    Frequency of Social Gatherings with Friends and Family: More than three times a week    Attends Religious Services: More than 4 times per year    Active Member of [employer] or Organizations: Yes    Attends Archivist Meetings: More than 4 times per year    Marital Status: Widowed    Activities of Daily Living    06/01/2022    5:05 PM  In your present state of health, do you have any difficulty performing the following activities:  Hearing? 0  Vision? 0  Difficulty concentrating or making decisions? 0  Walking or climbing stairs? 0  Dressing or bathing? 0  Doing errands, shopping? 0  Preparing Food and eating ? N  Using the Toilet? N  In the past six months, have you accidently leaked urine? Y  Do you have problems with loss of bowel control? N  Managing your Medications? N  Managing your Finances? N  Housekeeping or managing your Housekeeping? N    Patient Education/ Literacy How often do you need to have someone help you when you read instructions, pamphlets, or other written materials from your doctor or pharmacy?: 1 - Never What is the last grade level you completed in school?: Graduate school  Exercise Current Exercise Habits: Home exercise routine, Type of exercise: walking;strength training/weights;stretching, Time (Minutes): 20, Frequency (Times/Week): 2, Weekly Exercise (Minutes/Week): 40, Intensity: Moderate, Exercise limited by: None identified  Diet Patient reports consuming 3 meals a day and 2 snack(s) a day Patient reports that her primary diet is: Regular Patient reports that she does have regular access to food.   Depression Screen    06/04/2022    1:21 PM 04/23/2022   11:09 AM 08/28/2021    8:20 AM 07/26/2021   10:28 AM 05/29/2021   11:01 AM 10/13/2019    9:17 AM 09/09/2018    9:53 AM  PHQ 2/9 Scores  PHQ - 2 Score 0 1 0 0 0 0 0     Fall Risk    06/04/2022    1:21 PM 06/01/2022    5:05 PM 04/23/2022   11:09 AM 08/28/2021     8:20 AM 07/26/2021   10:29 AM  [place] in the past year? 0 0 0 0 0  Number falls in past yr: 0 0 0 0   Injury with Fall? 0 0 0 0   Risk for fall due to : No Fall Risks  No Fall Risks No Fall Risks   Follow up Falls evaluation completed  Falls evaluation completed Falls evaluation completed      Objective:  Teresa Spence seemed alert and oriented and she participated appropriately during our telephone visit.  Blood Pressure Weight BMI  BP Readings from Last 3 Encounters:  04/23/22 138/76  08/28/21 125/68  05/26/21 131/72   Wt Readings from Last 3 Encounters:  04/23/22 180 lb (81.6 kg)  08/28/21 179 lb (81.2 kg)  05/26/21 186 lb 11.2 oz (84.7 kg)   BMI Readings from Last 1 Encounters:  04/23/22 29.50 kg/m    *Unable to obtain current vital signs, weight, and BMI due to telephone visit type  Hearing/Vision  Nyonna did not seem to have difficulty with hearing/understanding during the telephone conversation Reports that she has had a formal eye exam by an eye care professional within the past year Reports that she has not had a formal hearing evaluation within the past year *Unable to fully assess hearing and vision during telephone visit type  Cognitive Function:    06/04/2022    1:38 PM 05/29/2021   11:19 AM 10/13/2019    9:21 AM  6CIT Screen  What Year? 0 points 0 points 0 points  What month? 0 points 0 points 0 points  What time? 0 points 0 points 0 points  Count back from 20 0 points 0 points 0 points  Months in reverse 0 points 0 points 0 points  Repeat phrase 0 points 0 points 0 points  Total Score 0 points 0 points 0 points   (Normal:0-7, Significant for Dysfunction: >8)  Normal Cognitive Function Screening: Yes   Immunization &  Health Maintenance Record Immunization History  Administered Date(s) Administered   Fluad Quad(high Dose 65+) 05/26/2021   Influenza, High Dose Seasonal PF 05/06/2019   Influenza,inj,Quad PF,6+ Mos 08/09/2017    Influenza-Unspecified 06/19/2018, 05/11/2020   Janssen (J&J) SARS-COV-2 Vaccination 12/19/2019   PFIZER(Purple Top)SARS-COV-2 Vaccination 09/15/2020   Pneumococcal Conjugate-13 05/06/2017   Pneumococcal Polysaccharide-23 11/05/2017   Tdap 11/24/2016, 04/05/2019   Zoster Recombinat (Shingrix) 05/26/2021    Health Maintenance  Topic Date Due   FOOT EXAM  06/05/2022 (Originally 08/23/2021)   Diabetic kidney evaluation - GFR measurement  06/06/2022 (Originally 05/26/2022)   COVID-19 Vaccine (3 - Janssen risk series) 06/20/2022 (Originally 11/10/2020)   Zoster Vaccines- Shingrix (2 of 2) 07/24/2022 (Originally 07/21/2021)   INFLUENZA VACCINE  12/09/2022 (Originally 04/10/2022)   MAMMOGRAM  06/05/2023 (Originally 07/14/2021)   OPHTHALMOLOGY EXAM  07/24/2022   HEMOGLOBIN A1C  10/24/2022   Diabetic kidney evaluation - Urine ACR  04/24/2023   COLONOSCOPY (Pts 45-30yr Insurance coverage will need to be confirmed)  05/16/2023   TETANUS/TDAP  04/04/2029   Pneumonia Vaccine 71 Years old  Completed   DEXA SCAN  Completed   Hepatitis C Screening  Completed   HPV VACCINES  Aged Out       Assessment  This is a routine wellness examination for [employer]  Health Maintenance: Due or Overdue There are no preventive care reminders to display for this patient.   AMarta Lamasdoes not need a referral for Community Assistance: Care Management:   no Social Work:    no Prescription Assistance:  no Nutrition/Diabetes Education:  not applicable   Plan:  Personalized Goals  Goals Addressed               This Visit's Progress     Patient Stated (pt-stated)        06/04/2022 AWV Goal: Diabetes Management  Patient will maintain an A1C level below 7.0 Patient will not develop any diabetic foot complications Patient will not experience any hypoglycemic episodes over the next 3 months Patient will notify our office of any CBG readings outside of the provider recommended range by calling  [phone] will adhere to provider recommendations for diabetes management  Patient Self Management Activities take all medications as prescribed and report any negative side effects monitor and record blood sugar readings as directed adhere to a low carbohydrate diet that incorporates lean proteins, vegetables, whole grains, low glycemic fruits check feet daily noting any sores, cracks, injuries, or callous formations see PCP or podiatrist if she notices any changes in her legs, feet, or toenails Patient will visit PCP and have an A1C level checked every 3 to 6 months as directed  have a yearly eye exam to monitor for vascular changes associated with diabetes and will request that the report be sent to her pcp.  consult with her PCP regarding any changes in her health or new or worsening symptoms        Personalized Health Maintenance & Screening Recommendations  Influenza vaccine Screening mammography Bone densitometry screening Shingrix vaccine-2nd dose  Patient declined Shingles dose at this time.  Lung Cancer Screening Recommended: no (Low Dose CT Chest recommended if Age 72-80years, 30 pack-year currently smoking OR have quit w/in past 15 years) Hepatitis C Screening recommended: no HIV Screening recommended: no  Advanced Directives: Written information was not prepared per patient's request.  Referrals & Orders No orders of the defined types were placed in this encounter.   Follow-up Plan Follow-up with BIran Planas  L, PA-C as planned Medicare wellness visit in one year.  Patient will access AVS on my chart.   I have personally reviewed and noted the following in the patient's chart:   Medical and social history Use of alcohol, tobacco or illicit drugs  Current medications and supplements Functional ability and status Nutritional status Physical activity Advanced directives List of other physicians Hospitalizations, surgeries, and ER visits in  previous 12 months Vitals Screenings to include cognitive, depression, and falls Referrals and appointments  In addition, I have reviewed and discussed with Teresa Spence certain preventive protocols, quality metrics, and best practice recommendations. A written personalized care plan for preventive services as well as general preventive health recommendations is available and can be mailed to the patient at her request.      Tinnie Gens, RN BSN  06/04/2022

## 2022-06-04 NOTE — Patient Instructions (Addendum)
[place] Maintenance Summary and Written Plan of Care  Teresa Spence ,  Thank you for allowing me to perform your Medicare Annual Wellness Visit and for your ongoing commitment to your health.   Health Maintenance & Immunization History Health Maintenance  Topic Date Due  . FOOT EXAM  06/05/2022 (Originally 08/23/2021)  . Diabetic kidney evaluation - GFR measurement  06/06/2022 (Originally 05/26/2022)  . COVID-19 Vaccine (3 - Janssen risk series) 06/20/2022 (Originally 11/10/2020)  . Zoster Vaccines- Shingrix (2 of 2) 07/24/2022 (Originally 07/21/2021)  . INFLUENZA VACCINE  12/09/2022 (Originally 04/10/2022)  . MAMMOGRAM  06/05/2023 (Originally 07/14/2021)  . OPHTHALMOLOGY EXAM  07/24/2022  . HEMOGLOBIN A1C  10/24/2022  . Diabetic kidney evaluation - Urine ACR  04/24/2023  . COLONOSCOPY (Pts 45-43yr Insurance coverage will need to be confirmed)  05/16/2023  . TETANUS/TDAP  04/04/2029  . Pneumonia Vaccine 72 Years old  Completed  . DEXA SCAN  Completed  . Hepatitis C Screening  Completed  . HPV VACCINES  Aged Out   Immunization History  Administered Date(s) Administered  . Fluad Quad(high Dose 65+) 05/26/2021  . Influenza, High Dose Seasonal PF 05/06/2019  . Influenza,inj,Quad PF,6+ Mos 08/09/2017  . Influenza-Unspecified 06/19/2018, 05/11/2020  . Janssen (J&J) SARS-COV-2 Vaccination 12/19/2019  . PFIZER(Purple Top)SARS-COV-2 Vaccination 09/15/2020  . Pneumococcal Conjugate-13 05/06/2017  . Pneumococcal Polysaccharide-23 11/05/2017  . Tdap 11/24/2016, 04/05/2019  . Zoster Recombinat (Shingrix) 05/26/2021    These are the patient goals that we discussed:  Goals Addressed              This Visit's Progress   .  Patient Stated (pt-stated)        06/04/2022 AWV Goal: Diabetes Management  Patient will maintain an A1C level below 7.0 Patient will not develop any diabetic foot complications Patient will not experience any hypoglycemic episodes  over the next 3 months Patient will notify our office of any CBG readings outside of the provider recommended range by calling [phone] will adhere to provider recommendations for diabetes management  Patient Self Management Activities take all medications as prescribed and report any negative side effects monitor and record blood sugar readings as directed adhere to a low carbohydrate diet that incorporates lean proteins, vegetables, whole grains, low glycemic fruits check feet daily noting any sores, cracks, injuries, or callous formations see PCP or podiatrist if she notices any changes in her legs, feet, or toenails Patient will visit PCP and have an A1C level checked every 3 to 6 months as directed  have a yearly eye exam to monitor for vascular changes associated with diabetes and will request that the report be sent to her pcp.  consult with her PCP regarding any changes in her health or new or worsening symptoms         This is a list of Health Maintenance Items that are overdue or due now: Influenza vaccine Screening mammography Bone densitometry screening Shingrix vaccine-2nd dose  Patient declined Shingles dose at this time.  Orders/Referrals Placed Today: No orders of the defined types were placed in this encounter.  (Contact our referral department at [phone] you have not spoken with someone about your referral appointment within the next 5 days)    Follow-up Plan Follow-up with BDonella Stade PA-C as planned Medicare wellness visit in one year.  Patient will access AVS on my chart.      Health Maintenance, Female Adopting a healthy lifestyle and getting preventive care are important in  promoting health and wellness. Ask your health care provider about: The right schedule for you to have regular tests and exams. Things you can do on your own to prevent diseases and keep yourself healthy. What should I know about diet, weight, and  exercise? Eat a healthy diet  Eat a diet that includes plenty of vegetables, fruits, low-fat dairy products, and lean protein. Do not eat a lot of foods that are high in solid fats, added sugars, or sodium. Maintain a healthy weight Body mass index (BMI) is used to identify weight problems. It estimates body fat based on height and weight. Your health care provider can help determine your BMI and help you achieve or maintain a healthy weight. Get regular exercise Get regular exercise. This is one of the most important things you can do for your health. Most adults should: Exercise for at least 150 minutes each week. The exercise should increase your heart rate and make you sweat (moderate-intensity exercise). Do strengthening exercises at least twice a week. This is in addition to the moderate-intensity exercise. Spend less time sitting. Even light physical activity can be beneficial. Watch cholesterol and blood lipids Have your blood tested for lipids and cholesterol at 72 years of age, then have this test every 5 years. Have your cholesterol levels checked more often if: Your lipid or cholesterol levels are high. You are older than 72 years of age. You are at high risk for heart disease. What should I know about cancer screening? Depending on your health history and family history, you may need to have cancer screening at various ages. This may include screening for: Breast cancer. Cervical cancer. Colorectal cancer. Skin cancer. Lung cancer. What should I know about heart disease, diabetes, and high blood pressure? Blood pressure and heart disease High blood pressure causes heart disease and increases the risk of stroke. This is more likely to develop in people who have high blood pressure readings or are overweight. Have your blood pressure checked: Every 3-5 years if you are 47-69 years of age. Every year if you are 44 years old or older. Diabetes Have regular diabetes  screenings. This checks your fasting blood sugar level. Have the screening done: Once every three years after age 27 if you are at a normal weight and have a low risk for diabetes. More often and at a younger age if you are overweight or have a high risk for diabetes. What should I know about preventing infection? Hepatitis B If you have a higher risk for hepatitis B, you should be screened for this virus. Talk with your health care provider to find out if you are at risk for hepatitis B infection. Hepatitis C Testing is recommended for: Everyone born from [birth year] through 1965. Anyone with known risk factors for hepatitis C. Sexually transmitted infections (STIs) Get screened for STIs, including gonorrhea and chlamydia, if: You are sexually active and are younger than 72 years of age. You are older than 72 years of age and your health care provider tells you that you are at risk for this type of infection. Your sexual activity has changed since you were last screened, and you are at increased risk for chlamydia or gonorrhea. Ask your health care provider if you are at risk. Ask your health care provider about whether you are at high risk for HIV. Your health care provider may recommend a prescription medicine to help prevent HIV infection. If you choose to take medicine to prevent HIV, you should first  get tested for HIV. You should then be tested every 3 months for as long as you are taking the medicine. Pregnancy If you are about to stop having your period (premenopausal) and you may become pregnant, seek counseling before you get pregnant. Take 400 to 800 micrograms (mcg) of folic acid every day if you become pregnant. Ask for birth control (contraception) if you want to prevent pregnancy. Osteoporosis and menopause Osteoporosis is a disease in which the bones lose minerals and strength with aging. This can result in bone fractures. If you are 11 years old or older, or if you are at risk for  osteoporosis and fractures, ask your health care provider if you should: Be screened for bone loss. Take a calcium or vitamin D supplement to lower your risk of fractures. Be given hormone replacement therapy (HRT) to treat symptoms of menopause. Follow these instructions at home: Alcohol use Do not drink alcohol if: Your health care provider tells you not to drink. You are pregnant, may be pregnant, or are planning to become pregnant. If you drink alcohol: Limit how much you have to: 0-1 drink a day. Know how much alcohol is in your drink. In the [place] one drink equals one 12 oz bottle of beer (355 mL), one 5 oz glass of wine (148 mL), or one 1 oz glass of hard liquor (44 mL). Lifestyle Do not use any products that contain nicotine or tobacco. These products include cigarettes, chewing tobacco, and vaping devices, such as e-cigarettes. If you need help quitting, ask your health care provider. Do not use street drugs. Do not share needles. Ask your health care provider for help if you need support or information about quitting drugs. General instructions Schedule regular health, dental, and eye exams. Stay current with your vaccines. Tell your health care provider if: You often feel depressed. You have ever been abused or do not feel safe at home. Summary Adopting a healthy lifestyle and getting preventive care are important in promoting health and wellness. Follow your health care provider's instructions about healthy diet, exercising, and getting tested or screened for diseases. Follow your health care provider's instructions on monitoring your cholesterol and blood pressure. This information is not intended to replace advice given to you by your health care provider. Make sure you discuss any questions you have with your health care provider. Document Revised: 01/16/2021 Document Reviewed: 01/16/2021 Elsevier Patient Education  [place].

## 2022-06-08 DIAGNOSIS — G5622 Lesion of ulnar nerve, left upper limb: Secondary | ICD-10-CM | POA: Diagnosis not present

## 2022-06-08 DIAGNOSIS — G5602 Carpal tunnel syndrome, left upper limb: Secondary | ICD-10-CM | POA: Diagnosis not present

## 2022-06-15 DIAGNOSIS — M79642 Pain in left hand: Secondary | ICD-10-CM | POA: Diagnosis not present

## 2022-06-21 DIAGNOSIS — M79641 Pain in right hand: Secondary | ICD-10-CM | POA: Diagnosis not present

## 2022-07-05 DIAGNOSIS — M79642 Pain in left hand: Secondary | ICD-10-CM | POA: Diagnosis not present

## 2022-07-17 DIAGNOSIS — M79642 Pain in left hand: Secondary | ICD-10-CM | POA: Diagnosis not present

## 2022-08-31 ENCOUNTER — Other Ambulatory Visit: Payer: Self-pay | Admitting: Physician Assistant

## 2022-08-31 DIAGNOSIS — E1169 Type 2 diabetes mellitus with other specified complication: Secondary | ICD-10-CM

## 2022-08-31 DIAGNOSIS — I10 Essential (primary) hypertension: Secondary | ICD-10-CM

## 2022-09-07 DIAGNOSIS — Z135 Encounter for screening for eye and ear disorders: Secondary | ICD-10-CM | POA: Diagnosis not present

## 2022-09-07 DIAGNOSIS — E119 Type 2 diabetes mellitus without complications: Secondary | ICD-10-CM | POA: Diagnosis not present

## 2022-09-07 DIAGNOSIS — H02882 Meibomian gland dysfunction right lower eyelid: Secondary | ICD-10-CM | POA: Diagnosis not present

## 2022-09-07 DIAGNOSIS — H524 Presbyopia: Secondary | ICD-10-CM | POA: Diagnosis not present

## 2022-09-07 DIAGNOSIS — H02885 Meibomian gland dysfunction left lower eyelid: Secondary | ICD-10-CM | POA: Diagnosis not present

## 2022-09-07 DIAGNOSIS — H40053 Ocular hypertension, bilateral: Secondary | ICD-10-CM | POA: Diagnosis not present

## 2022-09-07 DIAGNOSIS — H52223 Regular astigmatism, bilateral: Secondary | ICD-10-CM | POA: Diagnosis not present

## 2022-09-07 DIAGNOSIS — H2513 Age-related nuclear cataract, bilateral: Secondary | ICD-10-CM | POA: Diagnosis not present

## 2022-10-02 ENCOUNTER — Other Ambulatory Visit: Payer: Self-pay | Admitting: Physician Assistant

## 2022-10-02 DIAGNOSIS — E1169 Type 2 diabetes mellitus with other specified complication: Secondary | ICD-10-CM

## 2022-10-02 DIAGNOSIS — I10 Essential (primary) hypertension: Secondary | ICD-10-CM

## 2022-10-18 ENCOUNTER — Other Ambulatory Visit: Payer: Self-pay | Admitting: Physician Assistant

## 2022-10-18 DIAGNOSIS — E1169 Type 2 diabetes mellitus with other specified complication: Secondary | ICD-10-CM

## 2022-10-18 DIAGNOSIS — I10 Essential (primary) hypertension: Secondary | ICD-10-CM

## 2022-11-20 ENCOUNTER — Ambulatory Visit (INDEPENDENT_AMBULATORY_CARE_PROVIDER_SITE_OTHER): Payer: Medicare HMO | Admitting: Physician Assistant

## 2022-11-20 VITALS — BP 143/65 | HR 82 | Ht 65.5 in | Wt 184.0 lb

## 2022-11-20 DIAGNOSIS — E785 Hyperlipidemia, unspecified: Secondary | ICD-10-CM

## 2022-11-20 DIAGNOSIS — I10 Essential (primary) hypertension: Secondary | ICD-10-CM

## 2022-11-20 DIAGNOSIS — M25552 Pain in left hip: Secondary | ICD-10-CM | POA: Diagnosis not present

## 2022-11-20 DIAGNOSIS — E1142 Type 2 diabetes mellitus with diabetic polyneuropathy: Secondary | ICD-10-CM | POA: Diagnosis not present

## 2022-11-20 DIAGNOSIS — Z1329 Encounter for screening for other suspected endocrine disorder: Secondary | ICD-10-CM | POA: Diagnosis not present

## 2022-11-20 DIAGNOSIS — E1169 Type 2 diabetes mellitus with other specified complication: Secondary | ICD-10-CM | POA: Diagnosis not present

## 2022-11-20 DIAGNOSIS — L84 Corns and callosities: Secondary | ICD-10-CM

## 2022-11-20 DIAGNOSIS — Z79899 Other long term (current) drug therapy: Secondary | ICD-10-CM | POA: Diagnosis not present

## 2022-11-20 LAB — POCT GLYCOSYLATED HEMOGLOBIN (HGB A1C): Hemoglobin A1C: 8.1 % — AB (ref 4.0–5.6)

## 2022-11-20 MED ORDER — FREESTYLE LIBRE 3 SENSOR MISC: 1.0000 | 11 refills | Status: DC

## 2022-11-20 MED ORDER — LOSARTAN POTASSIUM-HCTZ 100-25 MG PO TABS: 1.0000 | ORAL_TABLET | Freq: Every day | ORAL | 1 refills | Status: DC

## 2022-11-20 MED ORDER — ATORVASTATIN CALCIUM 40 MG PO TABS: ORAL_TABLET | ORAL | 1 refills | Status: DC

## 2022-11-20 MED ORDER — FREESTYLE LIBRE 3 READER DEVI: 1.0000 | Freq: Once | 0 refills | Status: AC

## 2022-11-20 MED ORDER — OZEMPIC (0.25 OR 0.5 MG/DOSE) 2 MG/3ML ~~LOC~~ SOPN: 0.5000 mg | PEN_INJECTOR | SUBCUTANEOUS | 0 refills | Status: DC

## 2022-11-20 NOTE — Patient Instructions (Signed)

## 2022-11-20 NOTE — Progress Notes (Signed)
Established Patient Office Visit  Subjective   Patient ID: Teresa Spence, female    DOB: Apr 08, 1951  Age: 72 y.o. MRN: [id number]  Chief Complaint  Patient presents with   Follow-up    HPI Pt is a 72 yo obese female with T2DM, HTN, HLD who presents to the clinic for medication refills.   Her last A1c was 6.0. she never started ozempic due to concern for SE. She is very active and loves to dance and now in a study to research of dancing improves memory. No hypoglycemic events. No CP, palpitations, headaches or vision changes.   She has had left lateral hip pain with radiation down lateral leg into left knee. Leaning forward seems to take pressure off left hip. Laying on left side seems to make worse. No change in leg strength. Denies any back pain.    Patient Active Problem List   Diagnosis Date Noted   Plantar callus 11/23/2022   Left hip pain 11/23/2022   Type 2 diabetes mellitus with diabetic polyneuropathy, without long-term current use of insulin ([place]) 08/28/2021   CKD (chronic kidney disease) stage 2, GFR 60-89 ml/min 08/28/2021   Toe ulcer, left, with necrosis of muscle ([place]) 08/24/2020   Open toe wound, sequela 08/23/2020   Hyperlipidemia LDL goal <70 08/23/2020   Bilateral carpal tunnel syndrome 01/20/2020   DDD (degenerative disc disease), cervical 12/01/2019   Fine motor skill loss 12/01/2019   Tinnitus, left 09/14/2019   ETD (Eustachian tube dysfunction), bilateral 09/14/2019   Neuropathy 06/03/2019   Post-menopausal 06/03/2019   Generalized osteoarthritis of hand 09/11/2018   Colon polyps 05/21/2018   Hyperlipidemia associated with type 2 diabetes mellitus ([place]) 05/06/2018   Seborrheic keratoses 05/06/2018   Dermoid cyst of right ear 05/06/2018   Chest tightness 05/06/2018   Toenail fungus 10/25/2017   Numbness and tingling in both hands 05/08/2017   Primary osteoarthritis involving multiple joints 05/08/2017   Cellulitis of toe of left foot 12/29/2016    Absent pedal pulses 12/29/2016   Essential hypertension 12/27/2016   Uncontrolled type II diabetes with stage 2 chronic kidney disease 12/27/2016   Past Medical History:  Diagnosis Date   Allergy 1975   Environmental   Arthritis 1985   Bilateral carpal tunnel syndrome 01/20/2020   Diabetes mellitus without complication (HCC)    Heart murmur 105w   Hypertension    Family History  Problem Relation Age of Onset   Diabetes Mother    Hypertension Mother    Arthritis Mother    Hypertension Father    COPD Father    Heart attack Brother    Stroke Maternal Grandmother    Heart attack Maternal Grandfather    Emphysema Neg Hx    Allergies  Allergen Reactions   Lamisil [Terbinafine]     Stomach upset and Diarrhea with excess gas in tummy.       ROS See HPI.    Objective:     BP (!) 143/65   Pulse 82   Ht 5' 5.5" (1.664 m)   Wt 184 lb (83.5 kg)   SpO2 98%   BMI 30.15 kg/m  BP Readings from Last 3 Encounters:  11/20/22 (!) 143/65  04/23/22 138/76  08/28/21 125/68   Wt Readings from Last 3 Encounters:  11/20/22 184 lb (83.5 kg)  04/23/22 180 lb (81.6 kg)  08/28/21 179 lb (81.2 kg)      .[name] Lab Results  Component Value Date   HGBA1C 8.1 (A) 11/20/2022  Physical Exam Constitutional:      Appearance: Normal appearance. She is obese.  Cardiovascular:     Rate and Rhythm: Normal rate and regular rhythm.  Pulmonary:     Effort: Pulmonary effort is normal.  Musculoskeletal:     Right lower leg: No edema.     Left lower leg: No edema.     Comments: No tenderness over left greater trochanter.    Skin:    Comments: Right foot callus plantar first metatarsal with adjacent 1cm by 1cm hyperpigmented lesion.   Neurological:     General: No focal deficit present.     Mental Status: She is alert and oriented to person, place, and time.  Psychiatric:        Mood and Affect: Mood normal.         Assessment & Plan:  [name] KitchenMarland KitchenHortense was seen today for  follow-up.  Diagnoses and all orders for this visit:  Type 2 diabetes mellitus with diabetic polyneuropathy, without long-term current use of insulin (HCC) -     POCT glycosylated hemoglobin (Hb A1C) -     Semaglutide,0.25 or 0.5MG /DOS, (OZEMPIC, 0.25 OR 0.5 MG/DOSE,) 2 MG/3ML SOPN; Inject 0.5 mg into the skin once a week. -     Continuous Blood Gluc Receiver (FREESTYLE LIBRE 3 READER) DEVI; 1 Device by Does not apply route once for 1 dose. -     Continuous Blood Gluc Sensor (FREESTYLE LIBRE 3 SENSOR) MISC; 1 Device by Does not apply route every 14 (fourteen) days.  Hyperlipidemia associated with type 2 diabetes mellitus (HCC) -     Discontinue: atorvastatin (LIPITOR) 40 MG tablet; TAKE 1 TABLET BY MOUTH DAILY  Essential hypertension -     losartan-hydrochlorothiazide (HYZAAR) 100-25 MG tablet; Take 1 tablet by mouth daily.  Left hip pain  Plantar callus   A1C not to goal Please start ozempic, pt agrees Discussed SE Freestyle libre given to get some continuous glucose readings Increased lipitor to 80mg  BP not to goal, continue on hyzaar, recheck in 1 month nurse visit .[name] Diabetic Foot Exam - Simple   Simple Foot Form Diabetic Foot exam was performed with the following findings: Yes 11/20/2022  8:45 AM  Visual Inspection Sensation Testing Pulse Check Posterior Tibialis and Dorsalis pulse intact bilaterally: Yes Comments No feeling to monofilament testing bilaterally, any areas, has some toe deformities bilaterally due to multiple surgeries, black area at #4 left foot   Discussed epson foot soaks with gental pummel stone Follow up with podiatry I do not know what dark area is maybe just hematoma formation from trauma No signs of infection Needs eye exam.  Declined flu/covid/shingrix/pneumonia vaccines Follow up in 3 months   Sounds like lateral hip pain could be some burisitis and IT band syndrome Gave exercises Dicussed voltaren gel and ice as needed Follow up as needed  or if symptoms change or worsen     Iran Planas, PA-C

## 2022-11-21 LAB — COMPLETE METABOLIC PANEL WITH GFR
AG Ratio: 2 (calc) (ref 1.0–2.5)
ALT: 23 U/L (ref 6–29)
AST: 21 U/L (ref 10–35)
Albumin: 4.6 g/dL (ref 3.6–5.1)
Alkaline phosphatase (APISO): 61 U/L (ref 37–153)
BUN: 20 mg/dL (ref 7–25)
CO2: 24 mmol/L (ref 20–32)
Calcium: 9.4 mg/dL (ref 8.6–10.4)
Chloride: 102 mmol/L (ref 98–110)
Creat: 0.94 mg/dL (ref 0.60–1.00)
Globulin: 2.3 g/dL (calc) (ref 1.9–3.7)
Glucose, Bld: 148 mg/dL — ABNORMAL HIGH (ref 65–99)
Potassium: 4.4 mmol/L (ref 3.5–5.3)
Sodium: 141 mmol/L (ref 135–146)
Total Bilirubin: 0.5 mg/dL (ref 0.2–1.2)
Total Protein: 6.9 g/dL (ref 6.1–8.1)
eGFR: 64 mL/min/{1.73_m2} (ref >=60)

## 2022-11-21 LAB — CBC WITH DIFFERENTIAL/PLATELET
Absolute Monocytes: 569 cells/uL (ref 200–950)
Basophils Absolute: 88 cells/uL (ref 0–200)
Basophils Relative: 1.2 %
Eosinophils Absolute: 197 cells/uL (ref 15–500)
Eosinophils Relative: 2.7 %
HCT: 42.8 % (ref 35.0–45.0)
Hemoglobin: 14.4 g/dL (ref 11.7–15.5)
Lymphs Abs: 1694 cells/uL (ref 850–3900)
MCH: 31.1 pg (ref 27.0–33.0)
MCHC: 33.6 g/dL (ref 32.0–36.0)
MCV: 92.4 fL (ref 80.0–100.0)
MPV: 10 fL (ref 7.5–12.5)
Monocytes Relative: 7.8 %
Neutro Abs: 4752 cells/uL (ref 1500–7800)
Neutrophils Relative %: 65.1 %
Platelets: 281 10*3/uL (ref 140–400)
RBC: 4.63 10*6/uL (ref 3.80–5.10)
RDW: 11.9 % (ref 11.0–15.0)
Total Lymphocyte: 23.2 %
WBC: 7.3 10*3/uL (ref 3.8–10.8)

## 2022-11-21 LAB — LIPID PANEL W/REFLEX DIRECT LDL
Cholesterol: 171 mg/dL (ref <200)
HDL: 39 mg/dL — ABNORMAL LOW (ref >=50)
LDL Cholesterol (Calc): 94 mg/dL (calc)
Non-HDL Cholesterol (Calc): 132 mg/dL (calc) — ABNORMAL HIGH (ref <130)
Total CHOL/HDL Ratio: 4.4 (calc) (ref <5.0)
Triglycerides: 268 mg/dL — ABNORMAL HIGH (ref <150)

## 2022-11-21 LAB — TSH: TSH: 2.93 mIU/L (ref 0.40–4.50)

## 2022-11-21 MED ORDER — ATORVASTATIN CALCIUM 80 MG PO TABS: 80.0000 mg | ORAL_TABLET | Freq: Every day | ORAL | 3 refills | Status: DC

## 2022-11-21 NOTE — Progress Notes (Signed)
Teresa Spence,   Normal WBC and hemoglobin.  Thyroid looks great.  Kidney and liver look great.  LDL not quite to goal of under 70 but under 100. I would like to increase lipitor to '80mg'$  to get to goal. Are you ok with that?

## 2022-11-21 NOTE — Addendum Note (Signed)
Addended by: Donella Stade on: 11/21/2022 11:42 AM   Modules accepted: Orders

## 2022-11-23 ENCOUNTER — Encounter: Payer: Self-pay | Admitting: Physician Assistant

## 2022-11-23 DIAGNOSIS — L84 Corns and callosities: Secondary | ICD-10-CM | POA: Insufficient documentation

## 2022-11-23 DIAGNOSIS — M25552 Pain in left hip: Secondary | ICD-10-CM | POA: Insufficient documentation

## 2022-11-23 DIAGNOSIS — E1142 Type 2 diabetes mellitus with diabetic polyneuropathy: Secondary | ICD-10-CM | POA: Diagnosis not present

## 2022-11-23 DIAGNOSIS — M216X2 Other acquired deformities of left foot: Secondary | ICD-10-CM | POA: Diagnosis not present

## 2022-11-23 DIAGNOSIS — L97522 Non-pressure chronic ulcer of other part of left foot with fat layer exposed: Secondary | ICD-10-CM | POA: Diagnosis not present

## 2022-11-23 DIAGNOSIS — E11621 Type 2 diabetes mellitus with foot ulcer: Secondary | ICD-10-CM | POA: Diagnosis not present

## 2022-12-17 DIAGNOSIS — H5213 Myopia, bilateral: Secondary | ICD-10-CM | POA: Diagnosis not present

## 2022-12-19 DIAGNOSIS — L97522 Non-pressure chronic ulcer of other part of left foot with fat layer exposed: Secondary | ICD-10-CM | POA: Diagnosis not present

## 2022-12-19 DIAGNOSIS — E11621 Type 2 diabetes mellitus with foot ulcer: Secondary | ICD-10-CM | POA: Diagnosis not present

## 2022-12-25 DIAGNOSIS — E1122 Type 2 diabetes mellitus with diabetic chronic kidney disease: Secondary | ICD-10-CM | POA: Diagnosis not present

## 2022-12-25 DIAGNOSIS — E1142 Type 2 diabetes mellitus with diabetic polyneuropathy: Secondary | ICD-10-CM | POA: Diagnosis not present

## 2022-12-25 DIAGNOSIS — R269 Unspecified abnormalities of gait and mobility: Secondary | ICD-10-CM | POA: Diagnosis not present

## 2022-12-25 DIAGNOSIS — Z008 Encounter for other general examination: Secondary | ICD-10-CM | POA: Diagnosis not present

## 2022-12-25 DIAGNOSIS — E1165 Type 2 diabetes mellitus with hyperglycemia: Secondary | ICD-10-CM | POA: Diagnosis not present

## 2022-12-25 DIAGNOSIS — Z7984 Long term (current) use of oral hypoglycemic drugs: Secondary | ICD-10-CM | POA: Diagnosis not present

## 2022-12-25 DIAGNOSIS — N182 Chronic kidney disease, stage 2 (mild): Secondary | ICD-10-CM | POA: Diagnosis not present

## 2022-12-25 DIAGNOSIS — E669 Obesity, unspecified: Secondary | ICD-10-CM | POA: Diagnosis not present

## 2022-12-25 DIAGNOSIS — M1990 Unspecified osteoarthritis, unspecified site: Secondary | ICD-10-CM | POA: Diagnosis not present

## 2022-12-25 DIAGNOSIS — R32 Unspecified urinary incontinence: Secondary | ICD-10-CM | POA: Diagnosis not present

## 2022-12-25 DIAGNOSIS — Z8249 Family history of ischemic heart disease and other diseases of the circulatory system: Secondary | ICD-10-CM | POA: Diagnosis not present

## 2022-12-25 DIAGNOSIS — E785 Hyperlipidemia, unspecified: Secondary | ICD-10-CM | POA: Diagnosis not present

## 2022-12-25 DIAGNOSIS — Z87891 Personal history of nicotine dependence: Secondary | ICD-10-CM | POA: Diagnosis not present

## 2022-12-25 LAB — HEMOGLOBIN A1C: Hemoglobin A1C: 8

## 2022-12-25 LAB — HM DIABETES EYE EXAM

## 2023-01-18 ENCOUNTER — Encounter: Payer: Self-pay | Admitting: Physician Assistant

## 2023-02-22 ENCOUNTER — Ambulatory Visit: Payer: Medicare HMO | Admitting: Physician Assistant

## 2023-02-26 ENCOUNTER — Ambulatory Visit (INDEPENDENT_AMBULATORY_CARE_PROVIDER_SITE_OTHER): Payer: Medicare HMO | Admitting: Physician Assistant

## 2023-02-26 ENCOUNTER — Encounter: Payer: Self-pay | Admitting: Physician Assistant

## 2023-02-26 VITALS — BP 137/67 | HR 78 | Ht 65.5 in | Wt 188.0 lb

## 2023-02-26 DIAGNOSIS — E785 Hyperlipidemia, unspecified: Secondary | ICD-10-CM | POA: Diagnosis not present

## 2023-02-26 DIAGNOSIS — I10 Essential (primary) hypertension: Secondary | ICD-10-CM | POA: Diagnosis not present

## 2023-02-26 DIAGNOSIS — M79651 Pain in right thigh: Secondary | ICD-10-CM | POA: Insufficient documentation

## 2023-02-26 DIAGNOSIS — E1169 Type 2 diabetes mellitus with other specified complication: Secondary | ICD-10-CM

## 2023-02-26 DIAGNOSIS — M79652 Pain in left thigh: Secondary | ICD-10-CM | POA: Diagnosis not present

## 2023-02-26 DIAGNOSIS — Z7984 Long term (current) use of oral hypoglycemic drugs: Secondary | ICD-10-CM

## 2023-02-26 DIAGNOSIS — E1142 Type 2 diabetes mellitus with diabetic polyneuropathy: Secondary | ICD-10-CM

## 2023-02-26 LAB — POCT GLYCOSYLATED HEMOGLOBIN (HGB A1C): Hemoglobin A1C: 7.9 % — AB (ref 4.0–5.6)

## 2023-02-26 MED ORDER — METFORMIN HCL 1000 MG PO TABS: ORAL_TABLET | ORAL | 3 refills | Status: DC

## 2023-02-26 MED ORDER — OZEMPIC (0.25 OR 0.5 MG/DOSE) 2 MG/3ML ~~LOC~~ SOPN: 0.5000 mg | PEN_INJECTOR | SUBCUTANEOUS | 0 refills | Status: DC

## 2023-02-26 NOTE — Progress Notes (Signed)
Established Patient Office Visit  Subjective   Patient ID: Teresa Spence, female    DOB: 1950/12/08  Age: 72 y.o. MRN: [id number]  Chief Complaint  Patient presents with   Follow-up   Diabetes    HPI Patient is a 72 year old obese female with type 2 diabetes, hypertension, hyperlipidemia, neuropathy who presents to the clinic for 47-month follow-up.  Patient admits she is not checking her sugars.  She also admits she has not been compliant with her Ozempic.  She has only been taking the metformin.  She denies any hypoglycemic symptoms.  She denies any chest pain, palpitations, shortness of breath, headaches or vision changes.  She is not abiding by a diabetic diet.  She is staying very active but not exercising per se.  She has noticed some bilateral thigh pain and tightness.  She denies any low back pain or radiation into her legs.  She denies any strength changes.  She has not done anything to make better or worse. 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 12/27/2016   Cellulitis of toe of left foot 12/29/2016   Absent pedal pulses 12/29/2016   Numbness and tingling in both hands 05/08/2017   Primary osteoarthritis involving multiple joints 05/08/2017   Toenail fungus 10/25/2017   Hyperlipidemia associated with type 2 diabetes mellitus (HCC) 05/06/2018   Seborrheic keratoses 05/06/2018   Dermoid cyst of right ear 05/06/2018   Chest tightness 05/06/2018   Colon polyps 05/21/2018   Generalized osteoarthritis of hand 09/11/2018   Neuropathy 06/03/2019   Post-menopausal 06/03/2019   Tinnitus, left 09/14/2019   ETD (Eustachian tube dysfunction), bilateral 09/14/2019   DDD (degenerative disc disease), cervical 12/01/2019   Fine motor skill loss 12/01/2019   Bilateral carpal tunnel syndrome 01/20/2020   Open toe wound, sequela 08/23/2020   Hyperlipidemia LDL goal <70  08/23/2020   Toe ulcer, left, with necrosis of muscle (HCC) 08/24/2020   Type 2 diabetes mellitus with diabetic polyneuropathy, without long-term current use of insulin (HCC) 08/28/2021   CKD (chronic kidney disease) stage 2, GFR 60-89 ml/min 08/28/2021   Plantar callus 11/23/2022   Left hip pain 11/23/2022   Pain in both thighs 02/26/2023   Resolved Ambulatory Problems    Diagnosis Date Noted   Palpitations 12/27/2016   Blue toes 12/29/2016   Choking 05/08/2017   Class 1 obesity due to excess calories with serious comorbidity and body mass index (BMI) of 30.0 to 30.9 in adult 11/05/2017   Past Medical History:  Diagnosis Date   Allergy 1975   Arthritis 1985   Diabetes mellitus without complication (HCC)    Heart murmur 105w   Hypertension     ROS See HPI.    Objective:     BP 137/67 (BP Location: Right Arm, Patient Position: Sitting, Cuff Size: Normal)   Pulse 78   Ht 5' 5.5" (1.664 m)   Wt 188 lb (85.3 kg)   SpO2 98%   BMI 30.81 kg/m  BP Readings from Last 3 Encounters:  02/26/23 137/67  11/20/22 (!) 143/65  04/23/22 138/76   Wt Readings from Last 3 Encounters:  02/26/23 188 lb (85.3 kg)  11/20/22 184 lb (83.5 kg)  04/23/22 180 lb (81.6 kg)    . Results for orders placed or performed in visit on 02/26/23  POCT HgB A1C  Result Value Ref Range   Hemoglobin A1C 7.9 (A) 4.0 -  5.6 %   HbA1c POC (<> result, manual entry)     HbA1c, POC (prediabetic range)     HbA1c, POC (controlled diabetic range)       Physical Exam Constitutional:      Appearance: Normal appearance. She is obese.  HENT:     Head: Normocephalic.  Cardiovascular:     Rate and Rhythm: Normal rate and regular rhythm.     Pulses: Normal pulses.     Heart sounds: Murmur heard.  Pulmonary:     Effort: Pulmonary effort is normal.     Breath sounds: Normal breath sounds.  Musculoskeletal:     Right lower leg: No edema.     Left lower leg: No edema.     Comments: Normal ROM at waist and  bilateral legs Strength lower ext 5/5.   Neurological:     General: No focal deficit present.     Mental Status: She is alert and oriented to person, place, and time.  Psychiatric:        Mood and Affect: Mood normal.      The 10-year ASCVD risk score (Arnett DK, et al., 2019) is: 34.1%    Assessment & Plan:  [name] KitchenMarland KitchenTessah was seen today for follow-up and diabetes.  Diagnoses and all orders for this visit:  Type 2 diabetes mellitus with diabetic polyneuropathy, without long-term current use of insulin (HCC) -     POCT HgB A1C -     Semaglutide,0.25 or 0.5MG /DOS, (OZEMPIC, 0.25 OR 0.5 MG/DOSE,) 2 MG/3ML SOPN; Inject 0.5 mg into the skin once a week. -     metFORMIN (GLUCOPHAGE) 1000 MG tablet; TAKE 1 TABLET BY MOUTH TWICE A DAY WITH MEALS  Essential hypertension  Hyperlipidemia associated with type 2 diabetes mellitus (HCC)  Hyperlipidemia LDL goal <70  Pain in both thighs   A1C not to goal I think if she starts taking ozempic weekly and then increase to .5mg  weekly we can get her to goal BP very close to goal On statin Discussed DM diet Labs up to date Declined covid and shingles Follow up in 3 months  Suspect needs to stretch more at waist and hamstrings    Return in about 3 months (around 05/29/2023).    Tandy Gaw, PA-C

## 2023-02-27 ENCOUNTER — Encounter: Payer: Self-pay | Admitting: Physician Assistant

## 2023-03-19 ENCOUNTER — Encounter: Payer: Self-pay | Admitting: Physician Assistant

## 2023-03-21 ENCOUNTER — Telehealth: Payer: Self-pay

## 2023-03-21 ENCOUNTER — Encounter: Payer: Self-pay | Admitting: Physician Assistant

## 2023-03-21 NOTE — Telephone Encounter (Signed)
Initiated Prior authorization VQQ:[id number] Libre 3 Reader device Via: Covermymeds/called [facility] Case/Key:[id number] Status: n/a as of 03/21/23 Reason:[facility] [employer] is not able to process this request through ePA, please contact the plan at [phone] or fax in request to [phone]. Notified Pt via: Mychart

## 2023-05-22 ENCOUNTER — Other Ambulatory Visit: Payer: Self-pay | Admitting: Physician Assistant

## 2023-05-22 DIAGNOSIS — I10 Essential (primary) hypertension: Secondary | ICD-10-CM

## 2023-05-29 ENCOUNTER — Ambulatory Visit: Payer: Medicare HMO | Admitting: Physician Assistant

## 2023-06-05 NOTE — Progress Notes (Signed)
[facility] Quality Team Note  Name: Teresa Spence Date of Birth: 10/14/1950 MRN: [id number] Date: 06/05/2023  [facility] Quality Team has reviewed this patient's chart, please see recommendations below:  [facility] Quality Other; (BCS GAP- BREAST CANCER SCREENING. PATIENT NEEDS A MAMMOGRAM BEFORE 09/10/2023 FOR GAP CLOSURE. PLEASE ADDRESS AT UPCOMING OFFICE VISIT WITH PCP 06/10/2023)

## 2023-06-10 ENCOUNTER — Ambulatory Visit (INDEPENDENT_AMBULATORY_CARE_PROVIDER_SITE_OTHER): Payer: Medicare HMO | Admitting: Physician Assistant

## 2023-06-10 DIAGNOSIS — Z0000 Encounter for general adult medical examination without abnormal findings: Secondary | ICD-10-CM

## 2023-06-10 DIAGNOSIS — Z1231 Encounter for screening mammogram for malignant neoplasm of breast: Secondary | ICD-10-CM

## 2023-06-10 NOTE — Patient Instructions (Addendum)
MEDICARE ANNUAL WELLNESS VISIT Health Maintenance Summary and Written Plan of Care  Ms. Fawley ,  Thank you for allowing me to perform your Medicare Annual Wellness Visit and for your ongoing commitment to your health.   Health Maintenance & Immunization History Health Maintenance  Topic Date Due   Diabetic kidney evaluation - Urine ACR  06/11/2023 (Originally 04/24/2023)   COVID-19 Vaccine (3 - 2023-24 season) 06/26/2023 (Originally 05/12/2023)   Zoster Vaccines- Shingrix (2 of 2) 09/09/2023 (Originally 07/21/2021)   MAMMOGRAM  06/09/2024 (Originally 07/14/2021)   Colonoscopy  06/09/2024 (Originally 05/16/2023)   HEMOGLOBIN A1C  08/28/2023   Diabetic kidney evaluation - eGFR measurement  11/20/2023   FOOT EXAM  11/20/2023   OPHTHALMOLOGY EXAM  12/25/2023   Medicare Annual Wellness (AWV)  06/09/2024   DEXA SCAN  07/14/2024   DTaP/Tdap/Td (3 - Td or Tdap) 04/04/2029   Pneumonia Vaccine 73+ Years old  Completed   INFLUENZA VACCINE  Completed   Hepatitis C Screening  Completed   HPV VACCINES  Aged Out   Immunization History  Administered Date(s) Administered   Fluad Quad(high Dose 73+) 05/26/2021   Influenza, High Dose Seasonal PF 05/06/2019, 05/30/2023   Influenza,inj,Quad PF,6+ Mos 08/09/2017   Influenza-Unspecified 06/19/2018, 05/11/2020   Janssen (J&J) SARS-COV-2 Vaccination 12/19/2019   PFIZER(Purple Top)SARS-COV-2 Vaccination 09/15/2020   Pneumococcal Conjugate-13 05/06/2017   Pneumococcal Polysaccharide-23 11/05/2017   Tdap 11/24/2016, 04/05/2019   Zoster Recombinant(Shingrix) 05/26/2021    These are the patient goals that we discussed:  Goals Addressed               This Visit's Progress     Patient Stated (pt-stated)        Patient stated that she would like to work on her stamina, walking and balance.         This is a list of Health Maintenance Items that are overdue or due now: Mammogram - order placed Colonoscopy- scheduled Influenza vaccine 2nd  shingles vaccine - declined Urine ACR- will complete at the next in office visit.     Orders/Referrals Placed Today: Orders Placed This Encounter  Procedures   Mammogram Digital Diagnostic Bilateral    Standing Status:   Future    Standing Expiration Date:   06/09/2024    Scheduling Instructions:     Please call patient to schedule    Order Specific Question:   Reason for exam:    Answer:   breast cancer screening    Order Specific Question:   Preferred imaging location?    Answer:   [facility]   (Contact our referral department at [phone] if you have not spoken with someone about your referral appointment within the next 5 days)    Follow-up Plan Follow-up with Jomarie Longs, PA-C as planned Medicare wellness visit in one year.  Patient will access AVS on my chart.      Health Maintenance, Female Adopting a healthy lifestyle and getting preventive care are important in promoting health and wellness. Ask your health care provider about: The right schedule for you to have regular tests and exams. Things you can do on your own to prevent diseases and keep yourself healthy. What should I know about diet, weight, and exercise? Eat a healthy diet  Eat a diet that includes plenty of vegetables, fruits, low-fat dairy products, and lean protein. Do not eat a lot of foods that are high in solid fats, added sugars, or sodium. Maintain a healthy weight Body mass index (BMI) is  used to identify weight problems. It estimates body fat based on height and weight. Your health care provider can help determine your BMI and help you achieve or maintain a healthy weight. Get regular exercise Get regular exercise. This is one of the most important things you can do for your health. Most adults should: Exercise for at least 150 minutes each week. The exercise should increase your heart rate and make you sweat (moderate-intensity exercise). Do strengthening exercises at least  twice a week. This is in addition to the moderate-intensity exercise. Spend less time sitting. Even light physical activity can be beneficial. Watch cholesterol and blood lipids Have your blood tested for lipids and cholesterol at 73 years of age, then have this test every 5 years. Have your cholesterol levels checked more often if: Your lipid or cholesterol levels are high. You are older than 73 years of age. You are at high risk for heart disease. What should I know about cancer screening? Depending on your health history and family history, you may need to have cancer screening at various ages. This may include screening for: Breast cancer. Cervical cancer. Colorectal cancer. Skin cancer. Lung cancer. What should I know about heart disease, diabetes, and high blood pressure? Blood pressure and heart disease High blood pressure causes heart disease and increases the risk of stroke. This is more likely to develop in people who have high blood pressure readings or are overweight. Have your blood pressure checked: Every 3-5 years if you are 73-22 years of age. Every year if you are 29 years old or older. Diabetes Have regular diabetes screenings. This checks your fasting blood sugar level. Have the screening done: Once every three years after age 81 if you are at a normal weight and have a low risk for diabetes. More often and at a younger age if you are overweight or have a high risk for diabetes. What should I know about preventing infection? Hepatitis B If you have a higher risk for hepatitis B, you should be screened for this virus. Talk with your health care provider to find out if you are at risk for hepatitis B infection. Hepatitis C Testing is recommended for: Everyone born from [birth year] through 1966. Anyone with known risk factors for hepatitis C. Sexually transmitted infections (STIs) Get screened for STIs, including gonorrhea and chlamydia, if: You are sexually active and are  younger than 73 years of age. You are older than 73 years of age and your health care provider tells you that you are at risk for this type of infection. Your sexual activity has changed since you were last screened, and you are at increased risk for chlamydia or gonorrhea. Ask your health care provider if you are at risk. Ask your health care provider about whether you are at high risk for HIV. Your health care provider may recommend a prescription medicine to help prevent HIV infection. If you choose to take medicine to prevent HIV, you should first get tested for HIV. You should then be tested every 3 months for as long as you are taking the medicine. Pregnancy If you are about to stop having your period (premenopausal) and you may become pregnant, seek counseling before you get pregnant. Take 400 to 800 micrograms (mcg) of folic acid every day if you become pregnant. Ask for birth control (contraception) if you want to prevent pregnancy. Osteoporosis and menopause Osteoporosis is a disease in which the bones lose minerals and strength with aging. This can result in  bone fractures. If you are 47 years old or older, or if you are at risk for osteoporosis and fractures, ask your health care provider if you should: Be screened for bone loss. Take a calcium or vitamin D supplement to lower your risk of fractures. Be given hormone replacement therapy (HRT) to treat symptoms of menopause. Follow these instructions at home: Alcohol use Do not drink alcohol if: Your health care provider tells you not to drink. You are pregnant, may be pregnant, or are planning to become pregnant. If you drink alcohol: Limit how much you have to: 0-1 drink a day. Know how much alcohol is in your drink. In the [place] one drink equals one 12 oz bottle of beer (355 mL), one 5 oz glass of wine (148 mL), or one 1 oz glass of hard liquor (44 mL). Lifestyle Do not use any products that contain nicotine or tobacco. These  products include cigarettes, chewing tobacco, and vaping devices, such as e-cigarettes. If you need help quitting, ask your health care provider. Do not use street drugs. Do not share needles. Ask your health care provider for help if you need support or information about quitting drugs. General instructions Schedule regular health, dental, and eye exams. Stay current with your vaccines. Tell your health care provider if: You often feel depressed. You have ever been abused or do not feel safe at home. Summary Adopting a healthy lifestyle and getting preventive care are important in promoting health and wellness. Follow your health care provider's instructions about healthy diet, exercising, and getting tested or screened for diseases. Follow your health care provider's instructions on monitoring your cholesterol and blood pressure. This information is not intended to replace advice given to you by your health care provider. Make sure you discuss any questions you have with your health care provider. Document Revised: 01/16/2021 Document Reviewed: 01/16/2021 Elsevier Patient Education  2024 [employer].

## 2023-06-10 NOTE — Progress Notes (Signed)
MEDICARE ANNUAL WELLNESS VISIT  06/10/2023  Telephone Visit Disclaimer This Medicare AWV was conducted by telephone due to national recommendations for restrictions regarding the COVID-19 Pandemic (e.g. social distancing).  I verified, using two identifiers, that I am speaking with Teresa Spence or their authorized healthcare agent. I discussed the limitations, risks, security, and privacy concerns of performing an evaluation and management service by telephone and the potential availability of an in-person appointment in the future. The patient expressed understanding and agreed to proceed.  Location of Patient: Home Location of Provider (nurse):  In the office.  Subjective:    Teresa Spence is a 72 y.o. female patient of Jomarie Longs, PA-C who had a [employer] Visit today via telephone. Teresa Spence is Retired and lives alone. she has 1 child. she reports that she is socially active and does interact with friends/family regularly. she is moderately physically active and enjoys  reading, gardening, knitting and painting in her free time.  Patient Care Team: Nolene Ebbs as PCP - General (Family Medicine)     06/10/2023    1:08 PM 06/04/2022    1:21 PM 05/29/2021   11:00 AM 10/13/2019    9:13 AM 12/27/2016    1:38 PM  Advanced Directives  Does Patient Have a Medical Advance Directive? Yes Yes Yes Yes Yes  Type of Advance Directive Living will Living will Living will Healthcare Power of Devol;Living will Healthcare Power of Attorney  Does patient want to make changes to medical advance directive? No - Patient declined No - Patient declined No - Patient declined No - Patient declined   Copy of Healthcare Power of Attorney in Chart?    No - copy requested     Hospital Utilization Over the Past 12 Months: # of hospitalizations or ER visits: 0 # of surgeries: 1  Review of Systems    Patient reports that her overall health is better compared to last  year.  History obtained from chart review and the patient  Patient Reported Readings (BP, Pulse, CBG, Weight, etc) none Per patient no change in vitals since last visit, unable to obtain new vitals due to telehealth visit  Pain Assessment Pain : No/denies pain     Current Medications & Allergies (verified) Allergies as of 06/10/2023       Reactions   Lamisil [terbinafine]    Stomach upset and Diarrhea with excess gas in tummy.         Medication List        Accurate as of June 10, 2023  1:26 PM. If you have any questions, ask your nurse or doctor.          atorvastatin 80 MG tablet Commonly known as: LIPITOR Take 1 tablet (80 mg total) by mouth daily.   cholecalciferol 25 MCG (1000 UNIT) tablet Commonly known as: VITAMIN D3 Take 1,000 Units by mouth daily.   FreeStyle Libre 3 Sensor Misc 1 Device by Does not apply route every 14 (fourteen) days.   losartan-hydrochlorothiazide 100-25 MG tablet Commonly known as: HYZAAR TAKE 1 TABLET BY MOUTH DAILY   metFORMIN 1000 MG tablet Commonly known as: GLUCOPHAGE TAKE 1 TABLET BY MOUTH TWICE A DAY WITH MEALS   Ozempic (0.25 or 0.5 MG/DOSE) 2 MG/3ML Sopn Generic drug: Semaglutide(0.25 or 0.5MG /DOS) Inject 0.5 mg into the skin once a week.        History (reviewed): Past Medical History:  Diagnosis Date   Allergy 1975   Environmental  Arthritis 1985   Bilateral carpal tunnel syndrome 01/20/2020   Diabetes mellitus without complication (HCC)    Heart murmur 105w   Hypertension    Past Surgical History:  Procedure Laterality Date   BUNIONECTOMY Right 06/10/2019   HAMMER TOE SURGERY     JOINT REPLACEMENT  2015   MEDIAL PARTIAL KNEE REPLACEMENT     TONSILLECTOMY     Family History  Problem Relation Age of Onset   Diabetes Mother    Hypertension Mother    Arthritis Mother    Hypertension Father    COPD Father    Heart attack Brother    Stroke Maternal Grandmother    Heart attack Maternal  Grandfather    Emphysema Neg Hx    Social History   Socioeconomic History   Marital status: Widowed    Spouse name: Brynda Greathouse   Number of children: 1   Years of education: 17   Highest education level: Bachelor's degree (e.g., BA, AB, BS)  Occupational History   Occupation: Airline pilot    Comment: retired  Tobacco Use   Smoking status: Former   Smokeless tobacco: Never  Advertising account planner   Vaping status: Never Used  Substance and Sexual Activity   Alcohol use: Yes    Alcohol/week: 1.0 standard drink of alcohol    Types: 1 Glasses of wine per week    Comment: 1 glass a month   Drug use: No   Sexual activity: Not Currently  Other Topics Concern   Not on file  Social History Narrative   Lives alone. She has one child. She enjoys reading, gardening, knitting and painting in her free time. Her son lives in [place] and her step-daughter lives close by.    Social Determinants of Health   Financial Resource Strain: Low Risk  (06/10/2023)   Overall Financial Resource Strain (CARDIA)    Difficulty of Paying Living Expenses: Not hard at all  Food Insecurity: No Food Insecurity (06/10/2023)   Hunger Vital Sign    Worried About Running Out of Food in the Last Year: Never true    Ran Out of Food in the Last Year: Never true  Transportation Needs: No Transportation Needs (06/10/2023)   PRAPARE - Administrator, Civil Service (Medical): No    Lack of Transportation (Non-Medical): No  Physical Activity: Insufficiently Active (06/10/2023)   Exercise Vital Sign    Days of Exercise per Week: 3 days    Minutes of Exercise per Session: 20 min  Stress: No Stress Concern Present (06/10/2023)   [employer] of Occupational Health - Occupational Stress Questionnaire    Feeling of Stress : Not at all  Social Connections: Moderately Integrated (06/10/2023)   Social Connection and Isolation Panel [NHANES]    Frequency of Communication with Friends and Family: More than three times a week     Frequency of Social Gatherings with Friends and Family: More than three times a week    Attends Religious Services: More than 4 times per year    Active Member of [employer] or Organizations: Yes    Attends Banker Meetings: More than 4 times per year    Marital Status: Widowed    Activities of Daily Living    06/10/2023    1:14 PM  In your present state of health, do you have any difficulty performing the following activities:  Hearing? 0  Vision? 0  Difficulty concentrating or making decisions? 0  Walking or climbing stairs? 0  Dressing or  bathing? 0  Doing errands, shopping? 0  Preparing Food and eating ? N  Using the Toilet? N  In the past six months, have you accidently leaked urine? N  Do you have problems with loss of bowel control? N  Managing your Medications? N  Managing your Finances? N  Housekeeping or managing your Housekeeping? N    Patient Education/ Literacy How often do you need to have someone help you when you read instructions, pamphlets, or other written materials from your doctor or pharmacy?: 1 - Never What is the last grade level you completed in school?: Graduate work  Exercise    Diet Patient reports consuming 3 meals a day and 1 snack(s) a day Patient reports that her primary diet is: Regular Patient reports that she does have regular access to food.   Depression Screen    06/10/2023    1:09 PM 02/26/2023    9:22 AM 06/04/2022    1:21 PM 04/23/2022   11:09 AM 08/28/2021    8:20 AM 07/26/2021   10:28 AM 05/29/2021   11:01 AM  PHQ 2/9 Scores  PHQ - 2 Score 0 0 0 1 0 0 0     Fall Risk    06/10/2023    1:09 PM 02/26/2023    9:20 AM 06/04/2022    1:21 PM 06/01/2022    5:05 PM 04/23/2022   11:09 AM  Fall Risk   Falls in the past year? 1 1 0 0 0  Number falls in past yr: 0 0 0 0 0  Injury with Fall? 0  0 0 0  Risk for fall due to : History of fall(s)  No Fall Risks  No Fall Risks  Follow up Falls evaluation completed;Education  provided;Falls prevention discussed  Falls evaluation completed  Falls evaluation completed     Objective:  Teresa Spence seemed alert and oriented and she participated appropriately during our telephone visit.  Blood Pressure Weight BMI  BP Readings from Last 3 Encounters:  02/26/23 137/67  11/20/22 (!) 143/65  04/23/22 138/76   Wt Readings from Last 3 Encounters:  02/26/23 188 lb (85.3 kg)  11/20/22 184 lb (83.5 kg)  04/23/22 180 lb (81.6 kg)   BMI Readings from Last 1 Encounters:  02/26/23 30.81 kg/m    *Unable to obtain current vital signs, weight, and BMI due to telephone visit type  Hearing/Vision  Teresa Spence did not seem to have difficulty with hearing/understanding during the telephone conversation Reports that she has had a formal eye exam by an eye care professional within the past year Reports that she has not had a formal hearing evaluation within the past year *Unable to fully assess hearing and vision during telephone visit type  Cognitive Function:    06/10/2023    1:19 PM 06/04/2022    1:38 PM 05/29/2021   11:19 AM 10/13/2019    9:21 AM  6CIT Screen  What Year? 0 points 0 points 0 points 0 points  What month? 0 points 0 points 0 points 0 points  What time? 0 points 0 points 0 points 0 points  Count back from 20 0 points 0 points 0 points 0 points  Months in reverse 0 points 0 points 0 points 0 points  Repeat phrase 0 points 0 points 0 points 0 points  Total Score 0 points 0 points 0 points 0 points   (Normal:0-7, Significant for Dysfunction: >8)  Normal Cognitive Function Screening: Yes   Immunization & Health Maintenance Record Immunization  History  Administered Date(s) Administered   Fluad Quad(high Dose 65+) 05/26/2021   Influenza, High Dose Seasonal PF 05/06/2019, 05/30/2023   Influenza,inj,Quad PF,6+ Mos 08/09/2017   Influenza-Unspecified 06/19/2018, 05/11/2020   Janssen (J&J) SARS-COV-2 Vaccination 12/19/2019   PFIZER(Purple Top)SARS-COV-2  Vaccination 09/15/2020   Pneumococcal Conjugate-13 05/06/2017   Pneumococcal Polysaccharide-23 11/05/2017   Tdap 11/24/2016, 04/05/2019   Zoster Recombinant(Shingrix) 05/26/2021    Health Maintenance  Topic Date Due   Diabetic kidney evaluation - Urine ACR  06/11/2023 (Originally 04/24/2023)   COVID-19 Vaccine (3 - 2023-24 season) 06/26/2023 (Originally 05/12/2023)   Zoster Vaccines- Shingrix (2 of 2) 09/09/2023 (Originally 07/21/2021)   MAMMOGRAM  06/09/2024 (Originally 07/14/2021)   Colonoscopy  06/09/2024 (Originally 05/16/2023)   HEMOGLOBIN A1C  08/28/2023   Diabetic kidney evaluation - eGFR measurement  11/20/2023   FOOT EXAM  11/20/2023   OPHTHALMOLOGY EXAM  12/25/2023   Medicare Annual Wellness (AWV)  06/09/2024   DEXA SCAN  07/14/2024   DTaP/Tdap/Td (3 - Td or Tdap) 04/04/2029   Pneumonia Vaccine 47+ Years old  Completed   INFLUENZA VACCINE  Completed   Hepatitis C Screening  Completed   HPV VACCINES  Aged Out       Assessment  This is a routine wellness examination for [employer].  Health Maintenance: Due or Overdue There are no preventive care reminders to display for this patient.   Teresa Spence does not need a referral for Community Assistance: Care Management:   no Social Work:    no Prescription Assistance:  no Nutrition/Diabetes Education:  no   Plan:  Personalized Goals  Goals Addressed               This Visit's Progress     Patient Stated (pt-stated)        Patient stated that she would like to work on her stamina, walking and balance.       Personalized Health Maintenance & Screening Recommendations  Mammogram - order placed Colonoscopy- scheduled Influenza vaccine 2nd shingles vaccine - declined Urine ACR- will complete at the next in office visit.   Lung Cancer Screening Recommended: no (Low Dose CT Chest recommended if Age 20-80 years, 20 pack-year currently smoking OR have quit w/in past 15 years) Hepatitis C Screening  recommended: no HIV Screening recommended: no  Advanced Directives: Written information was not prepared per patient's request.  Referrals & Orders Orders Placed This Encounter  Procedures   Mammogram Digital Diagnostic Bilateral    Follow-up Plan Follow-up with Jomarie Longs, PA-C as planned Medicare wellness visit in one year.  Patient will access AVS on my chart.   I have personally reviewed and noted the following in the patient's chart:   Medical and social history Use of alcohol, tobacco or illicit drugs  Current medications and supplements Functional ability and status Nutritional status Physical activity Advanced directives List of other physicians Hospitalizations, surgeries, and ER visits in previous 12 months Vitals Screenings to include cognitive, depression, and falls Referrals and appointments  In addition, I have reviewed and discussed with Teresa Spence certain preventive protocols, quality metrics, and best practice recommendations. A written personalized care plan for preventive services as well as general preventive health recommendations is available and can be mailed to the patient at her request.      Modesto Charon, RN BSN  06/10/2023

## 2023-06-12 ENCOUNTER — Ambulatory Visit: Payer: Medicare HMO | Admitting: Physician Assistant

## 2023-06-12 ENCOUNTER — Encounter: Payer: Self-pay | Admitting: Physician Assistant

## 2023-06-12 VITALS — BP 130/78 | HR 81 | Ht 65.0 in | Wt 183.0 lb

## 2023-06-12 DIAGNOSIS — M25551 Pain in right hip: Secondary | ICD-10-CM

## 2023-06-12 DIAGNOSIS — E785 Hyperlipidemia, unspecified: Secondary | ICD-10-CM

## 2023-06-12 DIAGNOSIS — R29898 Other symptoms and signs involving the musculoskeletal system: Secondary | ICD-10-CM | POA: Diagnosis not present

## 2023-06-12 DIAGNOSIS — I10 Essential (primary) hypertension: Secondary | ICD-10-CM | POA: Diagnosis not present

## 2023-06-12 DIAGNOSIS — R809 Proteinuria, unspecified: Secondary | ICD-10-CM | POA: Diagnosis not present

## 2023-06-12 DIAGNOSIS — Z7985 Long-term (current) use of injectable non-insulin antidiabetic drugs: Secondary | ICD-10-CM | POA: Diagnosis not present

## 2023-06-12 DIAGNOSIS — M25552 Pain in left hip: Secondary | ICD-10-CM

## 2023-06-12 DIAGNOSIS — E1142 Type 2 diabetes mellitus with diabetic polyneuropathy: Secondary | ICD-10-CM

## 2023-06-12 LAB — POCT UA - MICROALBUMIN
Creatinine, POC: 200 mg/dL
Microalbumin Ur, POC: 80 mg/L

## 2023-06-12 LAB — POCT GLYCOSYLATED HEMOGLOBIN (HGB A1C): Hemoglobin A1C: 6.7 % — AB (ref 4.0–5.6)

## 2023-06-12 NOTE — Progress Notes (Signed)
Established Patient Office Visit  Subjective   Patient ID: Teresa Spence, female    DOB: 11-Dec-1950  Age: 72 y.o. MRN: [id number]  Chief Complaint  Patient presents with   Medical Management of Chronic Issues    Last A1c was 7.9    HPI Pt is a 72 yo femal with T2DM, HTN, HLD, neuropathy who presents to the clinic for 3 month follow up.   Pt has been taking her ozempic. She has not lost weight. She has tolerated the .5mg  dose fine. She is using [place] but having problems wearing it on the back on her arm due to sensor going off due to pressure issues. She wonders if she can wear it anywhere else. Sugars have been better and under 180s most of the time.  Pt denies any CP, palpitations, headaches, vision changes, open sores or wounds. She is taking her Hyzaar but not checking her sugars.   She is trying to stay active with walking but is having a lot of leg weakness and hip pain. She request PT to see if they can help with this. She has not fallen.   .. 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 12/27/2016   Cellulitis of toe of left foot 12/29/2016   Absent pedal pulses 12/29/2016   Numbness and tingling in both hands 05/08/2017   Primary osteoarthritis involving multiple joints 05/08/2017   Toenail fungus 10/25/2017   Hyperlipidemia associated with type 2 diabetes mellitus (HCC) 05/06/2018   Seborrheic keratoses 05/06/2018   Dermoid cyst of right ear 05/06/2018   Chest tightness 05/06/2018   Colon polyps 05/21/2018   Generalized osteoarthritis of hand 09/11/2018   Neuropathy 06/03/2019   Post-menopausal 06/03/2019   Tinnitus, left 09/14/2019   ETD (Eustachian tube dysfunction), bilateral 09/14/2019   DDD (degenerative disc disease), cervical 12/01/2019   Fine motor skill loss 12/01/2019   Bilateral carpal tunnel syndrome 01/20/2020   Open toe wound, sequela  08/23/2020   Hyperlipidemia LDL goal <70 08/23/2020   Toe ulcer, left, with necrosis of muscle (HCC) 08/24/2020   Type 2 diabetes mellitus with diabetic polyneuropathy, without long-term current use of insulin (HCC) 08/28/2021   CKD (chronic kidney disease) stage 2, GFR 60-89 ml/min 08/28/2021   Plantar callus 11/23/2022   Left hip pain 11/23/2022   Pain in both thighs 02/26/2023   Microalbuminuria 06/12/2023   Resolved Ambulatory Problems    Diagnosis Date Noted   Palpitations 12/27/2016   Blue toes 12/29/2016   Choking 05/08/2017   Class 1 obesity due to excess calories with serious comorbidity and body mass index (BMI) of 30.0 to 30.9 in adult 11/05/2017   Past Medical History:  Diagnosis Date   Allergy 1975   Arthritis 1985   Diabetes mellitus without complication (HCC)    Heart murmur 105w   Hypertension     ROS   See HPI.  Objective:     BP 130/78   Pulse 81   Ht 5\' 5"  (1.651 m)   Wt 183 lb (83 kg)   SpO2 99%   BMI 30.45 kg/m  BP Readings from Last 3 Encounters:  06/12/23 130/78  02/26/23 137/67  11/20/22 (!) 143/65   Wt Readings from Last 3 Encounters:  06/12/23 183 lb (83 kg)  02/26/23 188 lb (85.3 kg)  11/20/22 184 lb (83.5 kg)    .[name] Results for orders placed or performed in  visit on 06/12/23  POCT HgB A1C  Result Value Ref Range   Hemoglobin A1C 6.7 (A) 4.0 - 5.6 %   HbA1c POC (<> result, manual entry)     HbA1c, POC (prediabetic range)     HbA1c, POC (controlled diabetic range)    POCT UA - Microalbumin  Result Value Ref Range   Microalbumin Ur, POC 80 mg/L   Creatinine, POC 200 mg/dL   Albumin/Creatinine Ratio, Urine, POC 30-300      Physical Exam Constitutional:      Appearance: Normal appearance. She is obese.  HENT:     Head: Normocephalic.  Cardiovascular:     Rate and Rhythm: Normal rate and regular rhythm.  Pulmonary:     Effort: Pulmonary effort is normal.     Breath sounds: Normal breath sounds.  Musculoskeletal:      Right lower leg: No edema.     Left lower leg: No edema.     Comments: No tenderness to palpation over greater trochanters, bilaterally.   Neurological:     General: No focal deficit present.     Mental Status: She is alert and oriented to person, place, and time.  Psychiatric:        Mood and Affect: Mood normal.         Assessment & Plan:  [name] KitchenMarland KitchenEmber was seen today for medical management of chronic issues.  Diagnoses and all orders for this visit:  Type 2 diabetes mellitus with diabetic polyneuropathy, without long-term current use of insulin (HCC) -     POCT HgB A1C -     POCT UA - Microalbumin -     Semaglutide,0.25 or 0.5MG /DOS, (OZEMPIC, 0.25 OR 0.5 MG/DOSE,) 2 MG/3ML SOPN; Inject 0.5 mg into the skin once a week.  Essential hypertension  Hyperlipidemia LDL goal <70  Microalbuminuria  Bilateral leg weakness -     Ambulatory referral to Physical Therapy  Bilateral hip pain -     Ambulatory referral to Physical Therapy   A1C improved and to goal.  Continue ozempic Discussed sites she can use for libre sensor BP not to goal Abnormal microalbumin, on ARB.  Will consider SGLT-2 in future Flu and pneumonia vaccine UTD Declines covid vaccine Follow up in 3 months  PT referral for bilateral leg weakness and hip pain Use volatren gel over hips     Return in about 3 months (around 09/12/2023).    Tandy Gaw, PA-C

## 2023-06-12 NOTE — Patient Instructions (Signed)
Magnesium 400mg  at bedtime Referral for PT

## 2023-06-13 ENCOUNTER — Encounter: Payer: Self-pay | Admitting: Physician Assistant

## 2023-06-14 MED ORDER — OZEMPIC (0.25 OR 0.5 MG/DOSE) 2 MG/3ML ~~LOC~~ SOPN: 0.5000 mg | PEN_INJECTOR | SUBCUTANEOUS | 0 refills | Status: DC

## 2023-07-09 ENCOUNTER — Ambulatory Visit: Payer: Medicare HMO | Admitting: Physical Therapy

## 2023-07-10 ENCOUNTER — Ambulatory Visit: Payer: Medicare HMO

## 2023-07-10 DIAGNOSIS — Z0000 Encounter for general adult medical examination without abnormal findings: Secondary | ICD-10-CM

## 2023-07-10 DIAGNOSIS — M25872 Other specified joint disorders, left ankle and foot: Secondary | ICD-10-CM | POA: Diagnosis not present

## 2023-07-10 DIAGNOSIS — E11621 Type 2 diabetes mellitus with foot ulcer: Secondary | ICD-10-CM | POA: Diagnosis not present

## 2023-07-10 DIAGNOSIS — Z1231 Encounter for screening mammogram for malignant neoplasm of breast: Secondary | ICD-10-CM

## 2023-07-10 DIAGNOSIS — E1142 Type 2 diabetes mellitus with diabetic polyneuropathy: Secondary | ICD-10-CM | POA: Diagnosis not present

## 2023-07-10 DIAGNOSIS — L97423 Non-pressure chronic ulcer of left heel and midfoot with necrosis of muscle: Secondary | ICD-10-CM | POA: Diagnosis not present

## 2023-07-12 NOTE — Progress Notes (Signed)
Normal mammogram. Follow up in 1 year.

## 2023-07-16 NOTE — Therapy (Unsigned)
OUTPATIENT PHYSICAL THERAPY LOWER EXTREMITY EVALUATION   Patient Name: Teresa Spence MRN: [id number] DOB:[birth year], 72 y.o., female Today's Date: 07/17/2023  END OF SESSION:  PT End of Session - 07/17/23 1314     Visit Number 1    Number of Visits 24    Date for PT Re-Evaluation 10/09/23    Authorization Type [employer] medicare copay $20    Progress Note Due on Visit 10    PT Start Time 1145    PT Stop Time 1238    PT Time Calculation (min) 53 min    Activity Tolerance Patient tolerated treatment well             Past Medical History:  Diagnosis Date   Allergy 1975   Environmental   Arthritis 1985   Bilateral carpal tunnel syndrome 01/20/2020   Diabetes mellitus without complication (HCC)    Heart murmur 105w   Hypertension    Past Surgical History:  Procedure Laterality Date   BUNIONECTOMY Right 06/10/2019   HAMMER TOE SURGERY     JOINT REPLACEMENT  2015   MEDIAL PARTIAL KNEE REPLACEMENT     TONSILLECTOMY     Patient Active Problem List   Diagnosis Date Noted   Microalbuminuria 06/12/2023   Pain in both thighs 02/26/2023   Plantar callus 11/23/2022   Left hip pain 11/23/2022   Type 2 diabetes mellitus with diabetic polyneuropathy, without long-term current use of insulin (HCC) 08/28/2021   CKD (chronic kidney disease) stage 2, GFR 60-89 ml/min 08/28/2021   Toe ulcer, left, with necrosis of muscle (HCC) 08/24/2020   Open toe wound, sequela 08/23/2020   Hyperlipidemia LDL goal <70 08/23/2020   Bilateral carpal tunnel syndrome 01/20/2020   DDD (degenerative disc disease), cervical 12/01/2019   Fine motor skill loss 12/01/2019   Tinnitus, left 09/14/2019   ETD (Eustachian tube dysfunction), bilateral 09/14/2019   Neuropathy 06/03/2019   Post-menopausal 06/03/2019   Generalized osteoarthritis of hand 09/11/2018   Colon polyps 05/21/2018   Hyperlipidemia associated with type 2 diabetes mellitus (HCC) 05/06/2018   Seborrheic keratoses 05/06/2018   Dermoid cyst  of right ear 05/06/2018   Chest tightness 05/06/2018   Toenail fungus 10/25/2017   Numbness and tingling in both hands 05/08/2017   Primary osteoarthritis involving multiple joints 05/08/2017   Cellulitis of toe of left foot 12/29/2016   Absent pedal pulses 12/29/2016   Essential hypertension 12/27/2016   Uncontrolled type II diabetes with stage 2 chronic kidney disease 12/27/2016    PCP: Lesly Rubenstein L. Caleen Essex PA-C  REFERRING PROVIDER: Lonna Cobb Breeback, PA-C  REFERRING DIAG: Bilat leg weakness; bilat hip pain  THERAPY DIAG:  Pain of both hip joints  Muscle weakness (generalized)  Other abnormalities of gait and mobility  Other symptoms and signs involving the musculoskeletal system  Rationale for Evaluation and Treatment: Rehabilitation  ONSET DATE: 11/09/22  SUBJECTIVE:   SUBJECTIVE STATEMENT: Patient reports that she has pain in both hips and weakness in both legs. She reports that she had bunion surgery ~ 3-4 years ago and ended up with a staff infection in the foot requiring surgery to remove some of infected bone. She has had altered gait since the time of the surgery. She had a blood blister on L foot with debridement by MD 07/10/23. She is treating the wound for infection with oral antibiotics and changing dressing 3x/day with antibiotic ointment. Patient reports that she has stiffness when sitting more than one hour and she finds it difficult to stand upright. She  has pain in the front of thighs and in hips with standing or walking. She has pain after walking ~ 15 min. Pain resolves after sitting rest for a few minutes. She has difficulty going up and down stairs.   PERTINENT HISTORY: Bunion surgeries bilat feet ~ 3 yrs ago - total of 3 surgeries each foot; TKA L 2008; R 2009; HTN; arthritis; carpal tunnel surgery L; nerve release L elbow PAIN:  Are you having pain? Yes: NPRS scale: 0/10; up to 5-6/10 with standing and walking  Pain location: bilat hips R or L typically not  both at the same time  Pain description: sharp  Aggravating factors: standing; walking  Relieving factors: sitting TENS   PRECAUTIONS: Other: diabetic ulcer L midfoot with necrosis of muscle tissue   RED FLAGS: None   WEIGHT BEARING RESTRICTIONS: No  FALLS:  Has patient fallen in last 6 months? Yes. Number of falls 1 - fell forward onto hands and hit L knee sore but no lasting injury   LIVING ENVIRONMENT: Lives with: lives with their family and lives alone Lives in: House/apartment Stairs: Yes: Internal: 14 steps; on right going up and External: 1 steps; none Has following equipment at home: Grab bars  OCCUPATION: retired for Scientist, research (medical) retired ~ 5 yrs ago; takes classes and is involved with various groups; sedentary; sits in soft sofa when at home   PLOF: independent   PATIENT GOALS: get stronger in legs; work on being able to get up and down steps; get rid of some of the pain   NEXT MD VISIT: 09/17/23  OBJECTIVE:  Note: Objective measures were completed at Evaluation unless otherwise noted.  DIAGNOSTIC FINDINGS: none available   PATIENT SURVEYS:  FOTO 40; goal 67  COGNITION: Overall cognitive status: Within functional limits for tasks assessed     SENSATION: Numbness bilat feet following Morton's neuroma surgery years ago  EDEMA:  Min toward end of day   MUSCLE LENGTH: Hamstrings: Right 65 deg; Left 60 deg Tight hip flexors - unable to extend past neutral   POSTURE: rounded shoulders, forward head, increased thoracic kyphosis, flexed trunk , and weight shift right  PALPATION: Muscular tightness L posterior hip   LOWER EXTREMITY ROM: Tight d limited end range knee extension L > R        Tight R hip rotation   Active ROM Right eval Left eval  Hip flexion    Hip extension    Hip abduction    Hip adduction    Hip internal rotation    Hip external rotation    Knee flexion      PRONE  95 90  Knee extension    Ankle dorsiflexion    Ankle  plantarflexion    Ankle inversion    Ankle eversion     (Blank rows = not tested)  LOWER EXTREMITY MMT:  MMT Right eval Left eval  Hip flexion 4+ 4-  Hip extension 4- 3+  Hip abduction 4 3  Hip adduction    Hip internal rotation    Hip external rotation    Knee flexion 5 5  Knee extension 5 5  Ankle dorsiflexion    Ankle plantarflexion    Ankle inversion    Ankle eversion     (Blank rows = not tested)  FUNCTIONAL TESTS:  5 times sit to stand: 13.5 sec hands crossed over chest  Leg length difference - L LE ~ 3/4 in shorter than R   GAIT: Distance walked: 40  feet  Assistive device utilized: None Level of assistance: Complete Independence Comments: L Trendelenburg gait with hip instability; L LE shorter than R    [facility] Adult PT Treatment:                                                DATE: 07/17/23 Therapeutic Exercise: Prone  Glut set 10 sec x 5 Quad stretch 30 sec x 2 R/L  Hip extension 5 sec x 5 R/L  Supine  Piriformis stretch R 30 sec x 2 travell  Isometric hip abduction alternating LE's green TB 3 sec x 10 R/L  Bridging 5 sec x 5 focus on keeping hips straight   Self Care: Avoid sitting with LE's crossed     PATIENT EDUCATION:  Education details: POC; HEP  Person educated: Patient Education method: Explanation, Demonstration, Tactile cues, Verbal cues, and Handouts Education comprehension: verbalized understanding, returned demonstration, verbal cues required, tactile cues required, and needs further education  HOME EXERCISE PROGRAM: Access Code: [email or web address] URL: [email or web address] Date: 07/17/2023 Prepared by: Corlis Leak  Exercises - Prone Gluteal Sets  - 1 x daily - 7 x weekly - 1 sets - 10 reps - 10 sec  hold - Prone Quadriceps Stretch with Strap  - 1 x daily - 7 x weekly - 1 sets - 3 reps - 30 sec  hold - Prone Hip Extension  - 1 x daily - 7 x weekly - 1 sets - 10 reps - 3 sec  hold - Supine Piriformis Stretch with Leg Straight  -  1 x daily - 7 x weekly - 1 sets - 3 reps - 30 sec  hold - Hooklying Isometric Clamshell  - 1 x daily - 7 x weekly - 1 sets - 10 reps - 3 sec  hold - Supine Bridge with Resistance Band  - 1 x daily - 7 x weekly - 1-2 sets - 10 reps - 5-10 sec  hold  ASSESSMENT:  CLINICAL IMPRESSION: Patient is a 72 y.o. female who was seen today for physical therapy evaluation and treatment for bilat LE weakness and bilat hip pain with symptoms present over the past 6-8 months. 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 She presents today with L Trendelenburg gait pattern with hip instability and pain with standing and walking > 10-15 min. Patient has decreased hip and knee ROM; decreased strength bilat LEs; abnormal gait pattern; pain with functional activities. Patient will benefit from PT to address problems identified.   OBJECTIVE IMPAIRMENTS: Abnormal gait, decreased activity tolerance, decreased balance, decreased mobility, decreased ROM, decreased strength, hypomobility, increased fascial restrictions, improper body mechanics, postural dysfunction, and pain.   ACTIVITY LIMITATIONS: carrying, lifting, bending, sitting, standing, squatting, sleeping, stairs, transfers, and locomotion level  PARTICIPATION LIMITATIONS: meal prep, cleaning, and community activity  PERSONAL FACTORS: Education, Fitness, Past/current experiences, Time since onset of injury/illness/exacerbation, and comorbidities: AODM; peripheral neuropathy; diabetic ulcer L plantar surface  are also affecting patient's functional outcome.   REHAB POTENTIAL: Good  CLINICAL DECISION MAKING: Evolving/moderate complexity  EVALUATION COMPLEXITY: Moderate   GOALS: Goals reviewed with patient? Yes  SHORT TERM GOALS: Target date: 08/28/2023  Independent in initial HEP  Baseline: Goal status: INITIAL  2.  Improve standing alignment and posture  Baseline:  Goal status: INITIAL   LONG TERM GOALS:  Target date: 10/09/2023   Increase ROM R hip rotation with patient to demonstrated good I/ER range  Baseline:  Goal status: INITIAL  2.  4+ to 5/5 strength bilat LE's  Baseline:  Goal status: INITIAL  3.  Improve gait stability with less evidence of gait instability  Baseline:  Goal status: INITIAL  4.  Patient reports ability to walk for 20-30 min with minimal to no increase in pain  Baseline:  Goal status: INITIAL  5.  Decrease pain in bilat hips by 50-70% allowing patient to increase activity level  Baseline:  Goal status: INITIAL  6.  Independent in HEP, including aquatic program as indicated  Baseline:  Goal status: INITIAL    7. Improve functional limitation score to 53    Baseline: 40    Goal status: INITIAL   PLAN:  PT FREQUENCY: 2x/week  PT DURATION: 12 weeks  PLANNED INTERVENTIONS: 97164- PT Re-evaluation, 97110-Therapeutic exercises, 97530- Therapeutic activity, 97112- Neuromuscular re-education, 97535- Self Care, 95284- Manual therapy, [id number]- Gait training, [phone]- Aquatic Therapy, 97014- Electrical stimulation (unattended), 97035- Ultrasound, 01027- Ionotophoresis 4mg /ml Dexamethasone, Patient/Family education, Balance training, Stair training, Taping, Dry Needling, Joint mobilization, Spinal mobilization, Cryotherapy, and Moist heat  PLAN FOR NEXT SESSION: review and progress exercises; continue education and home instruction; manual work, DN, modalities as indicated. Further assess leg length and gait speed as indicated. Consider aquatic program as ulcer is fully healed.     Demontae Antunes Rober Minion, PT 07/17/2023, 1:18 PM

## 2023-07-17 ENCOUNTER — Ambulatory Visit: Payer: Medicare HMO | Attending: Physician Assistant | Admitting: Rehabilitative and Restorative Service Providers"

## 2023-07-17 ENCOUNTER — Other Ambulatory Visit: Payer: Self-pay

## 2023-07-17 ENCOUNTER — Encounter: Payer: Self-pay | Admitting: Rehabilitative and Restorative Service Providers"

## 2023-07-17 DIAGNOSIS — R29898 Other symptoms and signs involving the musculoskeletal system: Secondary | ICD-10-CM | POA: Diagnosis not present

## 2023-07-17 DIAGNOSIS — M25551 Pain in right hip: Secondary | ICD-10-CM | POA: Diagnosis not present

## 2023-07-17 DIAGNOSIS — M6281 Muscle weakness (generalized): Secondary | ICD-10-CM

## 2023-07-17 DIAGNOSIS — M25552 Pain in left hip: Secondary | ICD-10-CM | POA: Insufficient documentation

## 2023-07-17 DIAGNOSIS — R2689 Other abnormalities of gait and mobility: Secondary | ICD-10-CM

## 2023-07-22 ENCOUNTER — Encounter: Payer: Self-pay | Admitting: Rehabilitative and Restorative Service Providers"

## 2023-07-22 ENCOUNTER — Ambulatory Visit: Payer: Medicare HMO | Admitting: Rehabilitative and Restorative Service Providers"

## 2023-07-22 DIAGNOSIS — R2689 Other abnormalities of gait and mobility: Secondary | ICD-10-CM

## 2023-07-22 DIAGNOSIS — M25551 Pain in right hip: Secondary | ICD-10-CM

## 2023-07-22 DIAGNOSIS — M25552 Pain in left hip: Secondary | ICD-10-CM | POA: Diagnosis not present

## 2023-07-22 DIAGNOSIS — M6281 Muscle weakness (generalized): Secondary | ICD-10-CM

## 2023-07-22 DIAGNOSIS — R29898 Other symptoms and signs involving the musculoskeletal system: Secondary | ICD-10-CM

## 2023-07-22 NOTE — Therapy (Signed)
OUTPATIENT PHYSICAL THERAPY LOWER EXTREMITY EVALUATION   Patient Name: Teresa Spence MRN: [id number] DOB:1950-09-29, 72 y.o., female Today's Date: 07/22/2023  END OF SESSION:  PT End of Session - 07/22/23 1535     Visit Number 2    Number of Visits 24    Date for PT Re-Evaluation 10/09/23    Authorization Type [employer] medicare copay $20    Progress Note Due on Visit 10    PT Start Time 1533    PT Stop Time 1616    PT Time Calculation (min) 43 min    Activity Tolerance Patient tolerated treatment well             Past Medical History:  Diagnosis Date   Allergy 1975   Environmental   Arthritis 1985   Bilateral carpal tunnel syndrome 01/20/2020   Diabetes mellitus without complication (HCC)    Heart murmur 105w   Hypertension    Past Surgical History:  Procedure Laterality Date   BUNIONECTOMY Right 06/10/2019   HAMMER TOE SURGERY     JOINT REPLACEMENT  2015   MEDIAL PARTIAL KNEE REPLACEMENT     TONSILLECTOMY     Patient Active Problem List   Diagnosis Date Noted   Microalbuminuria 06/12/2023   Pain in both thighs 02/26/2023   Plantar callus 11/23/2022   Left hip pain 11/23/2022   Type 2 diabetes mellitus with diabetic polyneuropathy, without long-term current use of insulin (HCC) 08/28/2021   CKD (chronic kidney disease) stage 2, GFR 60-89 ml/min 08/28/2021   Toe ulcer, left, with necrosis of muscle (HCC) 08/24/2020   Open toe wound, sequela 08/23/2020   Hyperlipidemia LDL goal <70 08/23/2020   Bilateral carpal tunnel syndrome 01/20/2020   DDD (degenerative disc disease), cervical 12/01/2019   Fine motor skill loss 12/01/2019   Tinnitus, left 09/14/2019   ETD (Eustachian tube dysfunction), bilateral 09/14/2019   Neuropathy 06/03/2019   Post-menopausal 06/03/2019   Generalized osteoarthritis of hand 09/11/2018   Colon polyps 05/21/2018   Hyperlipidemia associated with type 2 diabetes mellitus (HCC) 05/06/2018   Seborrheic keratoses 05/06/2018   Dermoid cyst  of right ear 05/06/2018   Chest tightness 05/06/2018   Toenail fungus 10/25/2017   Numbness and tingling in both hands 05/08/2017   Primary osteoarthritis involving multiple joints 05/08/2017   Cellulitis of toe of left foot 12/29/2016   Absent pedal pulses 12/29/2016   Essential hypertension 12/27/2016   Uncontrolled type II diabetes with stage 2 chronic kidney disease 12/27/2016    PCP: Lesly Rubenstein L. Caleen Essex PA-C  REFERRING PROVIDER: Lonna Cobb Breeback, PA-C  REFERRING DIAG: Bilat leg weakness; bilat hip pain  THERAPY DIAG:  Pain of both hip joints  Muscle weakness (generalized)  Other abnormalities of gait and mobility  Other symptoms and signs involving the musculoskeletal system  Rationale for Evaluation and Treatment: Rehabilitation  ONSET DATE: 11/09/22  SUBJECTIVE:   SUBJECTIVE STATEMENT: Patient reports that she has no pain in hips or legs today and has been out walking and shopping and still does not have pain. She has had minimal to no pain in her hips. She has been working to avoid crossing her legs which has been difficult.    EVAL: Patient reports that she has pain in both hips and weakness in both legs. She reports that she had bunion surgery ~ 3-4 years ago and ended up with a staff infection in the foot requiring surgery to remove some of infected bone. She has had altered gait since the time of the  surgery. She had a blood blister on L foot with debridement by MD 07/10/23. She is treating the wound for infection with oral antibiotics and changing dressing 3x/day with antibiotic ointment. Patient reports that she has stiffness when sitting more than one hour and she finds it difficult to stand upright. She has pain in the front of thighs and in hips with standing or walking. She has pain after walking ~ 15 min. Pain resolves after sitting rest for a few minutes. She has difficulty going up and down stairs.   PERTINENT HISTORY: Bunion surgeries bilat feet ~ 3 yrs ago -  total of 3 surgeries each foot; TKA L 2008; R 2009; HTN; arthritis; carpal tunnel surgery L; nerve release L elbow PAIN:  Are you having pain? Yes: NPRS scale: 0/10; up to 5-6/10 with standing and walking  Pain location: bilat hips R or L typically not both at the same time  Pain description: sharp  Aggravating factors: standing; walking  Relieving factors: sitting TENS   PRECAUTIONS: Other: diabetic ulcer L midfoot with necrosis of muscle tissue    WEIGHT BEARING RESTRICTIONS: No  FALLS:  Has patient fallen in last 6 months? Yes. Number of falls 1 - fell forward onto hands and hit L knee sore but no lasting injury   LIVING ENVIRONMENT: Lives with: lives with their family and lives alone Lives in: House/apartment Stairs: Yes: Internal: 14 steps; on right going up and External: 1 steps; none Has following equipment at home: Grab bars  OCCUPATION: retired for Scientist, research (medical) retired ~ 5 yrs ago; takes classes and is involved with various groups; sedentary; sits in soft sofa when at home   PLOF: independent   PATIENT GOALS: get stronger in legs; work on being able to get up and down steps; get rid of some of the pain   NEXT MD VISIT: 09/17/23  OBJECTIVE:  Note: Objective measures were completed at Evaluation unless otherwise noted.  DIAGNOSTIC FINDINGS: none available   PATIENT SURVEYS:  FOTO 40; goal 25   SENSATION: Numbness bilat feet following Morton's neuroma surgery years ago  EDEMA:  Min toward end of day   MUSCLE LENGTH: Hamstrings: Right 65 deg; Left 60 deg Tight hip flexors - unable to extend past neutral   POSTURE: rounded shoulders, forward head, increased thoracic kyphosis, flexed trunk , and weight shift right  PALPATION: Muscular tightness L posterior hip   LOWER EXTREMITY ROM: Tight limited end range knee extension L > R        Tight R hip rotation   Active ROM Right eval Left eval  Hip flexion    Hip extension    Hip abduction    Hip  adduction    Hip internal rotation    Hip external rotation    Knee flexion      PRONE  95 90  Knee extension    Ankle dorsiflexion    Ankle plantarflexion    Ankle inversion    Ankle eversion     (Blank rows = not tested)  LOWER EXTREMITY MMT:  MMT Right eval Left eval  Hip flexion 4+ 4-  Hip extension 4- 3+  Hip abduction 4 3  Hip adduction    Hip internal rotation    Hip external rotation    Knee flexion 5 5  Knee extension 5 5  Ankle dorsiflexion    Ankle plantarflexion    Ankle inversion    Ankle eversion     (Blank rows = not tested)  FUNCTIONAL TESTS:  5 times sit to stand: 13.5 sec hands crossed over chest  Leg length difference - L LE ~ 3/4 in shorter than R   GAIT: Distance walked: 40 feet  Assistive device utilized: None Level of assistance: Complete Independence Comments: L Trendelenburg gait with hip instability; L LE shorter than R    [facility] Adult PT Treatment:                                                DATE: 07/22/23 Therapeutic Exercise: Prone  Glut set 10 sec x 5 Quad stretch 30 sec x 2 R/L  Hip extension 5 sec x 5 R/L  Supine  Piriformis stretch R 30 sec x 2 travell  Isometric hip abduction alternating LE's green TB 3 sec x 10 R/L  Bridging 5 sec x 5 focus on keeping hips straight  Bridging with green TB above knees 5 sec x 5 x 2  Sidelying  Hip abduction sidelying leading with heel 3 sec x 10 x 2 Sitting  Hip IR with ball btn knees yellow TB around ankles 3 sec x 10 x 2  Standing  SLS 10-15 sec x 3 R/L focus on holding hips tight and not dropping on R  Standing glut set   Self Care: Avoid sitting with LE's crossed    [facility] Adult PT Treatment:                                                DATE: 07/17/23 Therapeutic Exercise: Prone  Glut set 10 sec x 5 Quad stretch 30 sec x 2 R/L  Hip extension 5 sec x 5 R/L  Supine  Piriformis stretch R 30 sec x 2 travell  Isometric hip abduction alternating LE's green TB 3 sec x 10 R/L   Bridging 5 sec x 5 focus on keeping hips straight   Self Care: Avoid sitting with LE's crossed     PATIENT EDUCATION:  Education details: POC; HEP  Person educated: Patient Education method: Explanation, Demonstration, Tactile cues, Verbal cues, and Handouts Education comprehension: verbalized understanding, returned demonstration, verbal cues required, tactile cues required, and needs further education  HOME EXERCISE PROGRAM: Access Code: [email or web address] URL: [email or web address] Date: 07/22/2023 Prepared by: Corlis Leak  Exercises - Prone Gluteal Sets  - 1 x daily - 7 x weekly - 1 sets - 10 reps - 10 sec  hold - Prone Quadriceps Stretch with Strap  - 1 x daily - 7 x weekly - 1 sets - 3 reps - 30 sec  hold - Prone Hip Extension  - 1 x daily - 7 x weekly - 1 sets - 10 reps - 3 sec  hold - Supine Piriformis Stretch with Leg Straight  - 1 x daily - 7 x weekly - 1 sets - 3 reps - 30 sec  hold - Hooklying Isometric Clamshell  - 1 x daily - 7 x weekly - 1 sets - 10 reps - 3 sec  hold - Supine Bridge with Resistance Band  - 1 x daily - 7 x weekly - 1-2 sets - 10 reps - 5-10 sec  hold - Sidelying Hip Abduction  - 1 x daily - 7 x weekly - 3  sets - 10 reps - 3-5 sec  hold - Seated Hip Internal Rotation with Ball and Resistance  - 2 x daily - 7 x weekly - 1 sets - 10 reps - 3 sec  hold - Single Leg Stance with Support  - 2 x daily - 7 x weekly - 1 sets - 3 reps - 30 sec  hold  ASSESSMENT:  CLINICAL IMPRESSION: Excellent response to initial treatment and home program. Reviewed and progressed exercises with focus on stretching and strengthening hips/LE's as well as core stabilization.   Eval: Patient is a 72 y.o. female who was seen today for physical therapy evaluation and treatment for bilat LE weakness and bilat hip pain with symptoms present over the past 6-8 months. 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 She presents today with L  Trendelenburg gait pattern with hip instability and pain with standing and walking > 10-15 min. Patient has decreased hip and knee ROM; decreased strength bilat LEs; abnormal gait pattern; pain with functional activities. Patient will benefit from PT to address problems identified.   OBJECTIVE IMPAIRMENTS: Abnormal gait, decreased activity tolerance, decreased balance, decreased mobility, decreased ROM, decreased strength, hypomobility, increased fascial restrictions, improper body mechanics, postural dysfunction, and pain.    GOALS: Goals reviewed with patient? Yes  SHORT TERM GOALS: Target date: 08/28/2023  Independent in initial HEP  Baseline: Goal status: INITIAL  2.  Improve standing alignment and posture  Baseline:  Goal status: INITIAL   LONG TERM GOALS: Target date: 10/09/2023   Increase ROM R hip rotation with patient to demonstrated good I/ER range  Baseline:  Goal status: INITIAL  2.  4+ to 5/5 strength bilat LE's  Baseline:  Goal status: INITIAL  3.  Improve gait stability with less evidence of gait instability  Baseline:  Goal status: INITIAL  4.  Patient reports ability to walk for 20-30 min with minimal to no increase in pain  Baseline:  Goal status: INITIAL  5.  Decrease pain in bilat hips by 50-70% allowing patient to increase activity level  Baseline:  Goal status: INITIAL  6.  Independent in HEP, including aquatic program as indicated  Baseline:  Goal status: INITIAL    7. Improve functional limitation score to 53    Baseline: 40    Goal status: INITIAL   PLAN:  PT FREQUENCY: 2x/week  PT DURATION: 12 weeks  PLANNED INTERVENTIONS: 97164- PT Re-evaluation, 97110-Therapeutic exercises, 97530- Therapeutic activity, 97112- Neuromuscular re-education, 97535- Self Care, 40102- Manual therapy, [id number]- Gait training, [phone]- Aquatic Therapy, 97014- Electrical stimulation (unattended), 97035- Ultrasound, 64403- Ionotophoresis 4mg /ml Dexamethasone,  Patient/Family education, Balance training, Stair training, Taping, Dry Needling, Joint mobilization, Spinal mobilization, Cryotherapy, and Moist heat  PLAN FOR NEXT SESSION: review and progress exercises; continue education and home instruction; manual work, DN, modalities as indicated. Further assess leg length and gait speed as indicated. Consider aquatic program as ulcer is fully healed.     Rannie Craney Rober Minion, PT 07/22/2023, 4:20 PM

## 2023-07-24 ENCOUNTER — Ambulatory Visit: Payer: Medicare HMO

## 2023-07-24 DIAGNOSIS — M6281 Muscle weakness (generalized): Secondary | ICD-10-CM

## 2023-07-24 DIAGNOSIS — R29898 Other symptoms and signs involving the musculoskeletal system: Secondary | ICD-10-CM | POA: Diagnosis not present

## 2023-07-24 DIAGNOSIS — M25551 Pain in right hip: Secondary | ICD-10-CM | POA: Diagnosis not present

## 2023-07-24 DIAGNOSIS — M25552 Pain in left hip: Secondary | ICD-10-CM | POA: Diagnosis not present

## 2023-07-24 DIAGNOSIS — R2689 Other abnormalities of gait and mobility: Secondary | ICD-10-CM

## 2023-07-24 NOTE — Therapy (Signed)
OUTPATIENT PHYSICAL THERAPY LOWER EXTREMITY TREATMENT   Patient Name: Teresa Spence MRN: [id number] DOB:08/16/1951, 72 y.o., female Today's Date: 07/24/2023  END OF SESSION:  PT End of Session - 07/24/23 1400     Visit Number 3    Number of Visits 24    Date for PT Re-Evaluation 10/09/23    Authorization Type [employer] medicare copay $20    Authorization Time Period 16 VISITS APPROVED FOR PT 07/08/2023-09/06/2023    Authorization - Visit Number 3    Authorization - Number of Visits 16    Progress Note Due on Visit 10    PT Start Time 1400    PT Stop Time 1448    PT Time Calculation (min) 48 min    Activity Tolerance Patient tolerated treatment well    Behavior During Therapy WFL for tasks assessed/performed            Past Medical History:  Diagnosis Date   Allergy 1975   Environmental   Arthritis 1985   Bilateral carpal tunnel syndrome 01/20/2020   Diabetes mellitus without complication (HCC)    Heart murmur 105w   Hypertension    Past Surgical History:  Procedure Laterality Date   BUNIONECTOMY Right 06/10/2019   HAMMER TOE SURGERY     JOINT REPLACEMENT  2015   MEDIAL PARTIAL KNEE REPLACEMENT     TONSILLECTOMY     Patient Active Problem List   Diagnosis Date Noted   Microalbuminuria 06/12/2023   Pain in both thighs 02/26/2023   Plantar callus 11/23/2022   Left hip pain 11/23/2022   Type 2 diabetes mellitus with diabetic polyneuropathy, without long-term current use of insulin (HCC) 08/28/2021   CKD (chronic kidney disease) stage 2, GFR 60-89 ml/min 08/28/2021   Toe ulcer, left, with necrosis of muscle (HCC) 08/24/2020   Open toe wound, sequela 08/23/2020   Hyperlipidemia LDL goal <70 08/23/2020   Bilateral carpal tunnel syndrome 01/20/2020   DDD (degenerative disc disease), cervical 12/01/2019   Fine motor skill loss 12/01/2019   Tinnitus, left 09/14/2019   ETD (Eustachian tube dysfunction), bilateral 09/14/2019   Neuropathy 06/03/2019   Post-menopausal  06/03/2019   Generalized osteoarthritis of hand 09/11/2018   Colon polyps 05/21/2018   Hyperlipidemia associated with type 2 diabetes mellitus (HCC) 05/06/2018   Seborrheic keratoses 05/06/2018   Dermoid cyst of right ear 05/06/2018   Chest tightness 05/06/2018   Toenail fungus 10/25/2017   Numbness and tingling in both hands 05/08/2017   Primary osteoarthritis involving multiple joints 05/08/2017   Cellulitis of toe of left foot 12/29/2016   Absent pedal pulses 12/29/2016   Essential hypertension 12/27/2016   Uncontrolled type II diabetes with stage 2 chronic kidney disease 12/27/2016    PCP: Lesly Rubenstein L. Caleen Essex PA-C  REFERRING PROVIDER: Lonna Cobb Breeback, PA-C  REFERRING DIAG: Bilat leg weakness; bilat hip pain  THERAPY DIAG:  Pain of both hip joints  Other abnormalities of gait and mobility  Muscle weakness (generalized)  Other symptoms and signs involving the musculoskeletal system  Rationale for Evaluation and Treatment: Rehabilitation  ONSET DATE: 11/09/22  SUBJECTIVE:   SUBJECTIVE STATEMENT: Patient reports her glutes are sore from "all the clenching", states she has no pain in hips today. Patient states she is working on not crossing her legs.    EVAL: Patient reports that she has pain in both hips and weakness in both legs. She reports that she had bunion surgery ~ 3-4 years ago and ended up with a staff infection in the foot  requiring surgery to remove some of infected bone. She has had altered gait since the time of the surgery. She had a blood blister on L foot with debridement by MD 07/10/23. She is treating the wound for infection with oral antibiotics and changing dressing 3x/day with antibiotic ointment. Patient reports that she has stiffness when sitting more than one hour and she finds it difficult to stand upright. She has pain in the front of thighs and in hips with standing or walking. She has pain after walking ~ 15 min. Pain resolves after sitting rest for a  few minutes. She has difficulty going up and down stairs.   PERTINENT HISTORY: Bunion surgeries bilat feet ~ 3 yrs ago - total of 3 surgeries each foot; TKA L 2008; R 2009; HTN; arthritis; carpal tunnel surgery L; nerve release L elbow PAIN:  Are you having pain? Yes: NPRS scale: 0/10; up to 5-6/10 with standing and walking  Pain location: bilat hips R or L typically not both at the same time  Pain description: sharp  Aggravating factors: standing; walking  Relieving factors: sitting TENS   PRECAUTIONS: Other: diabetic ulcer L midfoot with necrosis of muscle tissue    WEIGHT BEARING RESTRICTIONS: No  FALLS:  Has patient fallen in last 6 months? Yes. Number of falls 1 - fell forward onto hands and hit L knee sore but no lasting injury   LIVING ENVIRONMENT: Lives with: lives with their family and lives alone Lives in: House/apartment Stairs: Yes: Internal: 14 steps; on right going up and External: 1 steps; none Has following equipment at home: Grab bars  OCCUPATION: retired for Scientist, research (medical) retired ~ 5 yrs ago; takes classes and is involved with various groups; sedentary; sits in soft sofa when at home   PLOF: independent   PATIENT GOALS: get stronger in legs; work on being able to get up and down steps; get rid of some of the pain   NEXT MD VISIT: 09/17/23  OBJECTIVE:  Note: Objective measures were completed at Evaluation unless otherwise noted.  DIAGNOSTIC FINDINGS: none available   PATIENT SURVEYS:  FOTO 40; goal 54   SENSATION: Numbness bilat feet following Morton's neuroma surgery years ago  EDEMA:  Min toward end of day   MUSCLE LENGTH: Hamstrings: Right 65 deg; Left 60 deg Tight hip flexors - unable to extend past neutral   POSTURE: rounded shoulders, forward head, increased thoracic kyphosis, flexed trunk , and weight shift right  PALPATION: Muscular tightness L posterior hip   LOWER EXTREMITY ROM: Tight limited end range knee extension L > R         Tight R hip rotation   Active ROM Right eval Left eval  Hip flexion    Hip extension    Hip abduction    Hip adduction    Hip internal rotation    Hip external rotation    Knee flexion      PRONE  95 90  Knee extension    Ankle dorsiflexion    Ankle plantarflexion    Ankle inversion    Ankle eversion     (Blank rows = not tested)  LOWER EXTREMITY MMT:  MMT Right eval Left eval  Hip flexion 4+ 4-  Hip extension 4- 3+  Hip abduction 4 3  Hip adduction    Hip internal rotation    Hip external rotation    Knee flexion 5 5  Knee extension 5 5  Ankle dorsiflexion    Ankle plantarflexion  Ankle inversion    Ankle eversion     (Blank rows = not tested)  FUNCTIONAL TESTS:  5 times sit to stand: 13.5 sec hands crossed over chest  Leg length difference - L LE ~ 3/4 in shorter than R   GAIT: Distance walked: 40 feet  Assistive device utilized: None Level of assistance: Complete Independence Comments: L Trendelenburg gait with hip instability; L LE shorter than R    [facility] Adult PT Treatment:                                                DATE: 07/24/2023 Therapeutic Exercise: LTR --> figure 4 LTR x10 Bent knee fall out + GTB & TA activation Bridges + GTB hip abd iso --> bridges + clamshells blue TB Side Lying: Clamshells Bent knee in ER hip abd Glute med leg raises Small circles in abd Hip extension in abduction Standing hip hikes --> lift underneath L LE   [facility] Adult PT Treatment:                                                DATE: 07/22/23 Therapeutic Exercise: Prone  Glut set 10 sec x 5 Quad stretch 30 sec x 2 R/L  Hip extension 5 sec x 5 R/L  Supine  Piriformis stretch R 30 sec x 2 travell  Isometric hip abduction alternating LE's green TB 3 sec x 10 R/L  Bridging 5 sec x 5 focus on keeping hips straight  Bridging with green TB above knees 5 sec x 5 x 2  Sidelying  Hip abduction sidelying leading with heel 3 sec x 10 x 2 Sitting  Hip IR with ball  btn knees yellow TB around ankles 3 sec x 10 x 2  Standing  SLS 10-15 sec x 3 R/L focus on holding hips tight and not dropping on R  Standing glut set   Self Care: Avoid sitting with LE's crossed    [facility] Adult PT Treatment:                                                DATE: 07/17/23 Therapeutic Exercise: Prone  Glut set 10 sec x 5 Quad stretch 30 sec x 2 R/L  Hip extension 5 sec x 5 R/L  Supine  Piriformis stretch R 30 sec x 2 travell  Isometric hip abduction alternating LE's green TB 3 sec x 10 R/L  Bridging 5 sec x 5 focus on keeping hips straight   Self Care: Avoid sitting with LE's crossed     PATIENT EDUCATION:  Education details: POC; HEP  Person educated: Patient Education method: Explanation, Demonstration, Tactile cues, Verbal cues, and Handouts Education comprehension: verbalized understanding, returned demonstration, verbal cues required, tactile cues required, and needs further education  HOME EXERCISE PROGRAM: Access Code: [email or web address] URL: [email or web address] Date: 07/24/2023 Prepared by: Carlynn Herald  Exercises - Prone Gluteal Sets  - 1 x daily - 7 x weekly - 1 sets - 10 reps - 10 sec  hold - Prone Quadriceps Stretch with Strap  - 1 x daily -  7 x weekly - 1 sets - 3 reps - 30 sec  hold - Prone Hip Extension  - 1 x daily - 7 x weekly - 1 sets - 10 reps - 3 sec  hold - Supine Piriformis Stretch with Leg Straight  - 1 x daily - 7 x weekly - 1 sets - 3 reps - 30 sec  hold - Hooklying Isometric Clamshell  - 1 x daily - 7 x weekly - 1 sets - 10 reps - 3 sec  hold - Supine Bridge with Resistance Band  - 1 x daily - 7 x weekly - 1-2 sets - 10 reps - 5-10 sec  hold - Sidelying Hip Abduction  - 1 x daily - 7 x weekly - 3 sets - 10 reps - 3-5 sec  hold - Seated Hip Internal Rotation with Ball and Resistance  - 2 x daily - 7 x weekly - 1 sets - 10 reps - 3 sec  hold - Single Leg Stance with Support  - 2 x daily - 7 x weekly - 1 sets - 3 reps - 30 sec   hold - Standing Hip Hiking  - 1 x daily - 7 x weekly - 3 sets - 10 reps - Sidelying Hip Extension in Abduction  - 1 x daily - 7 x weekly - 3 sets - 10 reps - Sidelying Hip Extension in Abduction with Knee Bent  - 1 x daily - 7 x weekly - 3 sets - 10 reps  ASSESSMENT:  CLINICAL IMPRESSION: Hip strengthening progressed with side lying hip abduction variations; cueing needed to maintain proper pelvic alignment and stability. Noted greater weakness in L glute during prone hip extension. Patient very challenged with standing hip hiking exercise, with significant weakness noted on L LE.   Eval: Patient is a 72 y.o. female who was seen today for physical therapy evaluation and treatment for bilat LE weakness and bilat hip pain with symptoms present over the past 6-8 months. 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 She presents today with L Trendelenburg gait pattern with hip instability and pain with standing and walking > 10-15 min. Patient has decreased hip and knee ROM; decreased strength bilat LEs; abnormal gait pattern; pain with functional activities. Patient will benefit from PT to address problems identified.   OBJECTIVE IMPAIRMENTS: Abnormal gait, decreased activity tolerance, decreased balance, decreased mobility, decreased ROM, decreased strength, hypomobility, increased fascial restrictions, improper body mechanics, postural dysfunction, and pain.    GOALS: Goals reviewed with patient? Yes  SHORT TERM GOALS: Target date: 08/28/2023  Independent in initial HEP  Baseline: Goal status: INITIAL  2.  Improve standing alignment and posture  Baseline:  Goal status: INITIAL   LONG TERM GOALS: Target date: 10/09/2023   Increase ROM R hip rotation with patient to demonstrated good I/ER range  Baseline:  Goal status: INITIAL  2.  4+ to 5/5 strength bilat LE's  Baseline:  Goal status: INITIAL  3.  Improve gait stability with less  evidence of gait instability  Baseline:  Goal status: INITIAL  4.  Patient reports ability to walk for 20-30 min with minimal to no increase in pain  Baseline:  Goal status: INITIAL  5.  Decrease pain in bilat hips by 50-70% allowing patient to increase activity level  Baseline:  Goal status: INITIAL  6.  Independent in HEP, including aquatic program as indicated  Baseline:  Goal status: INITIAL  7. Improve functional limitation score to 53    Baseline: 40    Goal status: INITIAL   PLAN:  PT FREQUENCY: 2x/week  PT DURATION: 12 weeks  PLANNED INTERVENTIONS: 97164- PT Re-evaluation, 97110-Therapeutic exercises, 97530- Therapeutic activity, 97112- Neuromuscular re-education, 97535- Self Care, 16109- Manual therapy, [id number]- Gait training, [phone]- Aquatic Therapy, 97014- Electrical stimulation (unattended), 97035- Ultrasound, 09811- Ionotophoresis 4mg /ml Dexamethasone, Patient/Family education, Balance training, Stair training, Taping, Dry Needling, Joint mobilization, Spinal mobilization, Cryotherapy, and Moist heat  PLAN FOR NEXT SESSION: review and progress exercises; continue education and home instruction; manual work, DN, modalities as indicated. Further assess leg length and gait speed as indicated. Consider aquatic program as ulcer is fully healed.     Sanjuana Mae, PTA 07/24/2023, 2:48 PM

## 2023-07-29 ENCOUNTER — Ambulatory Visit: Payer: Medicare HMO

## 2023-07-29 DIAGNOSIS — M25551 Pain in right hip: Secondary | ICD-10-CM

## 2023-07-29 DIAGNOSIS — R2689 Other abnormalities of gait and mobility: Secondary | ICD-10-CM

## 2023-07-29 DIAGNOSIS — M6281 Muscle weakness (generalized): Secondary | ICD-10-CM

## 2023-07-29 DIAGNOSIS — M25552 Pain in left hip: Secondary | ICD-10-CM | POA: Diagnosis not present

## 2023-07-29 DIAGNOSIS — R29898 Other symptoms and signs involving the musculoskeletal system: Secondary | ICD-10-CM | POA: Diagnosis not present

## 2023-07-29 NOTE — Therapy (Signed)
OUTPATIENT PHYSICAL THERAPY LOWER EXTREMITY TREATMENT   Patient Name: Teresa Spence MRN: [id number] DOB:Jun 15, 1951, 72 y.o., female Today's Date: 07/29/2023  END OF SESSION:  PT End of Session - 07/29/23 1449     Visit Number 4    Number of Visits 24    Date for PT Re-Evaluation 10/09/23    Authorization Type [employer] medicare copay $20    Authorization Time Period 16 VISITS APPROVED FOR PT 07/08/2023-09/06/2023    Authorization - Visit Number 4    Authorization - Number of Visits 16    Progress Note Due on Visit 10    PT Start Time 1448    PT Stop Time 1531    PT Time Calculation (min) 43 min    Activity Tolerance Patient tolerated treatment well    Behavior During Therapy WFL for tasks assessed/performed            Past Medical History:  Diagnosis Date   Allergy 1975   Environmental   Arthritis 1985   Bilateral carpal tunnel syndrome 01/20/2020   Diabetes mellitus without complication (HCC)    Heart murmur 105w   Hypertension    Past Surgical History:  Procedure Laterality Date   BUNIONECTOMY Right 06/10/2019   HAMMER TOE SURGERY     JOINT REPLACEMENT  2015   MEDIAL PARTIAL KNEE REPLACEMENT     TONSILLECTOMY     Patient Active Problem List   Diagnosis Date Noted   Microalbuminuria 06/12/2023   Pain in both thighs 02/26/2023   Plantar callus 11/23/2022   Left hip pain 11/23/2022   Type 2 diabetes mellitus with diabetic polyneuropathy, without long-term current use of insulin (HCC) 08/28/2021   CKD (chronic kidney disease) stage 2, GFR 60-89 ml/min 08/28/2021   Toe ulcer, left, with necrosis of muscle (HCC) 08/24/2020   Open toe wound, sequela 08/23/2020   Hyperlipidemia LDL goal <70 08/23/2020   Bilateral carpal tunnel syndrome 01/20/2020   DDD (degenerative disc disease), cervical 12/01/2019   Fine motor skill loss 12/01/2019   Tinnitus, left 09/14/2019   ETD (Eustachian tube dysfunction), bilateral 09/14/2019   Neuropathy 06/03/2019   Post-menopausal  06/03/2019   Generalized osteoarthritis of hand 09/11/2018   Colon polyps 05/21/2018   Hyperlipidemia associated with type 2 diabetes mellitus (HCC) 05/06/2018   Seborrheic keratoses 05/06/2018   Dermoid cyst of right ear 05/06/2018   Chest tightness 05/06/2018   Toenail fungus 10/25/2017   Numbness and tingling in both hands 05/08/2017   Primary osteoarthritis involving multiple joints 05/08/2017   Cellulitis of toe of left foot 12/29/2016   Absent pedal pulses 12/29/2016   Essential hypertension 12/27/2016   Uncontrolled type II diabetes with stage 2 chronic kidney disease 12/27/2016    PCP: Lesly Rubenstein L. Caleen Essex PA-C  REFERRING PROVIDER: Lonna Cobb Breeback, PA-C  REFERRING DIAG: Bilat leg weakness; bilat hip pain  THERAPY DIAG:  Pain of both hip joints  Other abnormalities of gait and mobility  Muscle weakness (generalized)  Other symptoms and signs involving the musculoskeletal system  Rationale for Evaluation and Treatment: Rehabilitation  ONSET DATE: 11/09/22  SUBJECTIVE:   SUBJECTIVE STATEMENT: Patient reports she did a house tour and went up/down a circular stone staircase; states she was in a lot of pain the next day in R glute.   EVAL: Patient reports that she has pain in both hips and weakness in both legs. She reports that she had bunion surgery ~ 3-4 years ago and ended up with a staff infection in the foot requiring  surgery to remove some of infected bone. She has had altered gait since the time of the surgery. She had a blood blister on L foot with debridement by MD 07/10/23. She is treating the wound for infection with oral antibiotics and changing dressing 3x/day with antibiotic ointment. Patient reports that she has stiffness when sitting more than one hour and she finds it difficult to stand upright. She has pain in the front of thighs and in hips with standing or walking. She has pain after walking ~ 15 min. Pain resolves after sitting rest for a few minutes. She  has difficulty going up and down stairs.   PERTINENT HISTORY: Bunion surgeries bilat feet ~ 3 yrs ago - total of 3 surgeries each foot; TKA L 2008; R 2009; HTN; arthritis; carpal tunnel surgery L; nerve release L elbow PAIN:  Are you having pain? Yes: NPRS scale: 0/10; up to 5-6/10 with standing and walking  Pain location: bilat hips R or L typically not both at the same time  Pain description: sharp  Aggravating factors: standing; walking  Relieving factors: sitting TENS   PRECAUTIONS: Other: diabetic ulcer L midfoot with necrosis of muscle tissue    WEIGHT BEARING RESTRICTIONS: No  FALLS:  Has patient fallen in last 6 months? Yes. Number of falls 1 - fell forward onto hands and hit L knee sore but no lasting injury   LIVING ENVIRONMENT: Lives with: lives with their family and lives alone Lives in: House/apartment Stairs: Yes: Internal: 14 steps; on right going up and External: 1 steps; none Has following equipment at home: Grab bars  OCCUPATION: retired for Scientist, research (medical) retired ~ 5 yrs ago; takes classes and is involved with various groups; sedentary; sits in soft sofa when at home   PLOF: independent   PATIENT GOALS: get stronger in legs; work on being able to get up and down steps; get rid of some of the pain   NEXT MD VISIT: 09/17/23  OBJECTIVE:  Note: Objective measures were completed at Evaluation unless otherwise noted.  DIAGNOSTIC FINDINGS: none available   PATIENT SURVEYS:  FOTO 40; goal 68   SENSATION: Numbness bilat feet following Morton's neuroma surgery years ago  EDEMA:  Min toward end of day   MUSCLE LENGTH: Hamstrings: Right 65 deg; Left 60 deg Tight hip flexors - unable to extend past neutral   POSTURE: rounded shoulders, forward head, increased thoracic kyphosis, flexed trunk , and weight shift right  PALPATION: Muscular tightness L posterior hip   LOWER EXTREMITY ROM: Tight limited end range knee extension L > R        Tight R hip  rotation   Active ROM Right eval Left eval  Hip flexion    Hip extension    Hip abduction    Hip adduction    Hip internal rotation    Hip external rotation    Knee flexion      PRONE  95 90  Knee extension    Ankle dorsiflexion    Ankle plantarflexion    Ankle inversion    Ankle eversion     (Blank rows = not tested)  LOWER EXTREMITY MMT:  MMT Right eval Left eval  Hip flexion 4+ 4-  Hip extension 4- 3+  Hip abduction 4 3  Hip adduction    Hip internal rotation    Hip external rotation    Knee flexion 5 5  Knee extension 5 5  Ankle dorsiflexion    Ankle plantarflexion  Ankle inversion    Ankle eversion     (Blank rows = not tested)  FUNCTIONAL TESTS:  5 times sit to stand: 13.5 sec hands crossed over chest  Leg length difference - L LE ~ 3/4 in shorter than R   GAIT: Distance walked: 40 feet  Assistive device utilized: None Level of assistance: Complete Independence Comments: L Trendelenburg gait with hip instability; L LE shorter than R    [facility] Adult PT Treatment:                                                DATE: 07/29/2023 Therapeutic Exercise: Supine straight leg piriformis stretch Figure 4 LTR Bridges --> staggered stance bridges Side Lying: Clamshells + blue TB 2x15 Reverse clamshells + YTB 2x10 Hip extension in abduction 2x10 Small leg circle CW/CCW x 10 each Standing: Hip hike + small ball roll up wall x10 Lateral heel tap downs --> R SLS on 4" step, L SLS on 6" step Seated figure 4 stretch   [facility] Adult PT Treatment:                                                DATE: 07/24/2023 Therapeutic Exercise: LTR --> figure 4 LTR x10 Bent knee fall out + GTB & TA activation Bridges + GTB hip abd iso --> bridges + clamshells blue TB Side Lying: Clamshells Bent knee in ER hip abd Glute med leg raises Small circles in abd Hip extension in abduction Standing hip hikes --> lift underneath L LE   [facility] Adult PT Treatment:                                                 DATE: 07/22/23 Therapeutic Exercise: Prone  Glut set 10 sec x 5 Quad stretch 30 sec x 2 R/L  Hip extension 5 sec x 5 R/L  Supine  Piriformis stretch R 30 sec x 2 travell  Isometric hip abduction alternating LE's green TB 3 sec x 10 R/L  Bridging 5 sec x 5 focus on keeping hips straight  Bridging with green TB above knees 5 sec x 5 x 2  Sidelying  Hip abduction sidelying leading with heel 3 sec x 10 x 2 Sitting  Hip IR with ball btn knees yellow TB around ankles 3 sec x 10 x 2  Standing  SLS 10-15 sec x 3 R/L focus on holding hips tight and not dropping on R  Standing glut set   Self Care: Avoid sitting with LE's crossed     PATIENT EDUCATION:  Education details: POC; HEP  Person educated: Patient Education method: Explanation, Demonstration, Tactile cues, Verbal cues, and Handouts Education comprehension: verbalized understanding, returned demonstration, verbal cues required, tactile cues required, and needs further education  HOME EXERCISE PROGRAM: Access Code: [email or web address] URL: [email or web address] Date: 07/24/2023 Prepared by: Carlynn Herald  Exercises - Prone Gluteal Sets  - 1 x daily - 7 x weekly - 1 sets - 10 reps - 10 sec  hold - Prone Quadriceps Stretch with Strap  - 1 x daily - 7 x  weekly - 1 sets - 3 reps - 30 sec  hold - Prone Hip Extension  - 1 x daily - 7 x weekly - 1 sets - 10 reps - 3 sec  hold - Supine Piriformis Stretch with Leg Straight  - 1 x daily - 7 x weekly - 1 sets - 3 reps - 30 sec  hold - Hooklying Isometric Clamshell  - 1 x daily - 7 x weekly - 1 sets - 10 reps - 3 sec  hold - Supine Bridge with Resistance Band  - 1 x daily - 7 x weekly - 1-2 sets - 10 reps - 5-10 sec  hold - Sidelying Hip Abduction  - 1 x daily - 7 x weekly - 3 sets - 10 reps - 3-5 sec  hold - Seated Hip Internal Rotation with Ball and Resistance  - 2 x daily - 7 x weekly - 1 sets - 10 reps - 3 sec  hold - Single Leg Stance with Support  -  2 x daily - 7 x weekly - 1 sets - 3 reps - 30 sec  hold - Standing Hip Hiking  - 1 x daily - 7 x weekly - 3 sets - 10 reps - Sidelying Hip Extension in Abduction  - 1 x daily - 7 x weekly - 3 sets - 10 reps - Sidelying Hip Extension in Abduction with Knee Bent  - 1 x daily - 7 x weekly - 3 sets - 10 reps  ASSESSMENT:  CLINICAL IMPRESSION: Hip hiking variations continued in standing; occasional tactile cues provided to help with mechanics. Patient demonstrated better mechanics with pelvic stability and hip hiking today compared to last session.  Eval: Patient is a 72 y.o. female who was seen today for physical therapy evaluation and treatment for bilat LE weakness and bilat hip pain with symptoms present over the past 6-8 months. 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 She presents today with L Trendelenburg gait pattern with hip instability and pain with standing and walking > 10-15 min. Patient has decreased hip and knee ROM; decreased strength bilat LEs; abnormal gait pattern; pain with functional activities. Patient will benefit from PT to address problems identified.   OBJECTIVE IMPAIRMENTS: Abnormal gait, decreased activity tolerance, decreased balance, decreased mobility, decreased ROM, decreased strength, hypomobility, increased fascial restrictions, improper body mechanics, postural dysfunction, and pain.    GOALS: Goals reviewed with patient? Yes  SHORT TERM GOALS: Target date: 08/28/2023  Independent in initial HEP  Baseline: Goal status: INITIAL  2.  Improve standing alignment and posture  Baseline:  Goal status: INITIAL   LONG TERM GOALS: Target date: 10/09/2023   Increase ROM R hip rotation with patient to demonstrated good I/ER range  Baseline:  Goal status: INITIAL  2.  4+ to 5/5 strength bilat LE's  Baseline:  Goal status: INITIAL  3.  Improve gait stability with less evidence of gait instability  Baseline:  Goal  status: INITIAL  4.  Patient reports ability to walk for 20-30 min with minimal to no increase in pain  Baseline:  Goal status: INITIAL  5.  Decrease pain in bilat hips by 50-70% allowing patient to increase activity level  Baseline:  Goal status: INITIAL  6.  Independent in HEP, including aquatic program as indicated  Baseline:  Goal status: INITIAL    7. Improve functional limitation score to 53    Baseline: 40    Goal  status: INITIAL   PLAN:  PT FREQUENCY: 2x/week  PT DURATION: 12 weeks  PLANNED INTERVENTIONS: 97164- PT Re-evaluation, 97110-Therapeutic exercises, 97530- Therapeutic activity, [id number]- Neuromuscular re-education, 97535- Self Care, 16109- Manual therapy, [id number]- Gait training, [phone]- Aquatic Therapy, 97014- Electrical stimulation (unattended), 97035- Ultrasound, 09811- Ionotophoresis 4mg /ml Dexamethasone, Patient/Family education, Balance training, Stair training, Taping, Dry Needling, Joint mobilization, Spinal mobilization, Cryotherapy, and Moist heat  PLAN FOR NEXT SESSION: Progress exercises; continue education and home instruction; manual work, DN, modalities as indicated. Further assess leg length and gait speed as indicated. Consider aquatic program as ulcer is fully healed.     Sanjuana Mae, PTA 07/29/2023, 3:32 PM

## 2023-07-31 ENCOUNTER — Encounter: Payer: Self-pay | Admitting: Rehabilitative and Restorative Service Providers"

## 2023-07-31 ENCOUNTER — Ambulatory Visit: Payer: Medicare HMO | Admitting: Rehabilitative and Restorative Service Providers"

## 2023-07-31 DIAGNOSIS — M25551 Pain in right hip: Secondary | ICD-10-CM | POA: Diagnosis not present

## 2023-07-31 DIAGNOSIS — M25552 Pain in left hip: Secondary | ICD-10-CM

## 2023-07-31 DIAGNOSIS — R2689 Other abnormalities of gait and mobility: Secondary | ICD-10-CM

## 2023-07-31 DIAGNOSIS — R29898 Other symptoms and signs involving the musculoskeletal system: Secondary | ICD-10-CM

## 2023-07-31 DIAGNOSIS — M6281 Muscle weakness (generalized): Secondary | ICD-10-CM

## 2023-07-31 NOTE — Therapy (Signed)
OUTPATIENT PHYSICAL THERAPY LOWER EXTREMITY TREATMENT   Patient Name: Teresa Spence MRN: [id number] DOB:Jan 11, 1951, 72 y.o., female Today's Date: 07/31/2023  END OF SESSION:  PT End of Session - 07/31/23 1402     Visit Number 5    Number of Visits 24    Date for PT Re-Evaluation 10/09/23    Authorization Type [employer] medicare copay $20    Authorization Time Period 16 VISITS APPROVED FOR PT 07/08/2023-09/06/2023    Authorization - Visit Number 5    Authorization - Number of Visits 16    Progress Note Due on Visit 10    PT Start Time 1402    PT Stop Time 1447    PT Time Calculation (min) 45 min    Activity Tolerance Patient tolerated treatment well            Past Medical History:  Diagnosis Date   Allergy 1975   Environmental   Arthritis 1985   Bilateral carpal tunnel syndrome 01/20/2020   Diabetes mellitus without complication (HCC)    Heart murmur 105w   Hypertension    Past Surgical History:  Procedure Laterality Date   BUNIONECTOMY Right 06/10/2019   HAMMER TOE SURGERY     JOINT REPLACEMENT  2015   MEDIAL PARTIAL KNEE REPLACEMENT     TONSILLECTOMY     Patient Active Problem List   Diagnosis Date Noted   Microalbuminuria 06/12/2023   Pain in both thighs 02/26/2023   Plantar callus 11/23/2022   Left hip pain 11/23/2022   Type 2 diabetes mellitus with diabetic polyneuropathy, without long-term current use of insulin (HCC) 08/28/2021   CKD (chronic kidney disease) stage 2, GFR 60-89 ml/min 08/28/2021   Toe ulcer, left, with necrosis of muscle (HCC) 08/24/2020   Open toe wound, sequela 08/23/2020   Hyperlipidemia LDL goal <70 08/23/2020   Bilateral carpal tunnel syndrome 01/20/2020   DDD (degenerative disc disease), cervical 12/01/2019   Fine motor skill loss 12/01/2019   Tinnitus, left 09/14/2019   ETD (Eustachian tube dysfunction), bilateral 09/14/2019   Neuropathy 06/03/2019   Post-menopausal 06/03/2019   Generalized osteoarthritis of hand 09/11/2018    Colon polyps 05/21/2018   Hyperlipidemia associated with type 2 diabetes mellitus (HCC) 05/06/2018   Seborrheic keratoses 05/06/2018   Dermoid cyst of right ear 05/06/2018   Chest tightness 05/06/2018   Toenail fungus 10/25/2017   Numbness and tingling in both hands 05/08/2017   Primary osteoarthritis involving multiple joints 05/08/2017   Cellulitis of toe of left foot 12/29/2016   Absent pedal pulses 12/29/2016   Essential hypertension 12/27/2016   Uncontrolled type II diabetes with stage 2 chronic kidney disease 12/27/2016    PCP: Lesly Rubenstein L. Caleen Essex PA-C  REFERRING PROVIDER: Lonna Cobb Breeback, PA-C  REFERRING DIAG: Bilat leg weakness; bilat hip pain  THERAPY DIAG:  Pain of both hip joints  Other abnormalities of gait and mobility  Muscle weakness (generalized)  Other symptoms and signs involving the musculoskeletal system  Rationale for Evaluation and Treatment: Rehabilitation  ONSET DATE: 11/09/22  SUBJECTIVE:   SUBJECTIVE STATEMENT: Patient reports she did a house tour and went up/down a circular stone staircase had a lot of pain the next day in R hip. Pain was better when she did her exercises. Hip pain is improving gradually but she is still having pain.   EVAL: Patient reports that she has pain in both hips and weakness in both legs. She reports that she had bunion surgery ~ 3-4 years ago and ended up with a  staff infection in the foot requiring surgery to remove some of infected bone. She has had altered gait since the time of the surgery. She had a blood blister on L foot with debridement by MD 07/10/23. She is treating the wound for infection with oral antibiotics and changing dressing 3x/day with antibiotic ointment. Patient reports that she has stiffness when sitting more than one hour and she finds it difficult to stand upright. She has pain in the front of thighs and in hips with standing or walking. She has pain after walking ~ 15 min. Pain resolves after sitting  rest for a few minutes. She has difficulty going up and down stairs.   PERTINENT HISTORY: Bunion surgeries bilat feet ~ 3 yrs ago - total of 3 surgeries each foot; TKA L 2008; R 2009; HTN; arthritis; carpal tunnel surgery L; nerve release L elbow PAIN:  Are you having pain? Yes: NPRS scale: 6/10 with walking; has been up to 8/10 since climbing the stairs; up to 5-6/10 with standing and walking  Pain location: bilat hips R or L typically not both at the same time  Pain description: sharp  Aggravating factors: standing; walking  Relieving factors: sitting TENS   PRECAUTIONS: Other: diabetic ulcer L midfoot with necrosis of muscle tissue    WEIGHT BEARING RESTRICTIONS: No  FALLS:  Has patient fallen in last 6 months? Yes. Number of falls 1 - fell forward onto hands and hit L knee sore but no lasting injury   LIVING ENVIRONMENT: Lives with: lives with their family and lives alone Lives in: House/apartment Stairs: Yes: Internal: 14 steps; on right going up and External: 1 steps; none Has following equipment at home: Grab bars  OCCUPATION: retired for Scientist, research (medical) retired ~ 5 yrs ago; takes classes and is involved with various groups; sedentary; sits in soft sofa when at home   PATIENT GOALS: get stronger in legs; work on being able to get up and down steps; get rid of some of the pain   NEXT MD VISIT: 09/17/23  OBJECTIVE:  Note: Objective measures were completed at Evaluation unless otherwise noted.  DIAGNOSTIC FINDINGS: none available   PATIENT SURVEYS:  FOTO 40; goal 77   SENSATION: Numbness bilat feet following Morton's neuroma surgery years ago  EDEMA:  Min toward end of day   MUSCLE LENGTH: Hamstrings: Right 65 deg; Left 60 deg Tight hip flexors - unable to extend past neutral   POSTURE: rounded shoulders, forward head, increased thoracic kyphosis, flexed trunk , and weight shift right  PALPATION: Muscular tightness L posterior hip   LOWER EXTREMITY ROM:  Tight limited end range knee extension L > R        Tight R hip rotation   Active ROM Right eval Left eval  Hip flexion    Hip extension    Hip abduction    Hip adduction    Hip internal rotation    Hip external rotation    Knee flexion      PRONE  95 90  Knee extension    Ankle dorsiflexion    Ankle plantarflexion    Ankle inversion    Ankle eversion     (Blank rows = not tested)  LOWER EXTREMITY MMT:  MMT Right eval Left eval  Hip flexion 4+ 4-  Hip extension 4- 3+  Hip abduction 4 3  Hip adduction    Hip internal rotation    Hip external rotation    Knee flexion 5 5  Knee  extension 5 5  Ankle dorsiflexion    Ankle plantarflexion    Ankle inversion    Ankle eversion     (Blank rows = not tested)  FUNCTIONAL TESTS:  5 times sit to stand: 13.5 sec hands crossed over chest  Leg length difference - L LE ~ 3/4 in shorter than R   GAIT: Distance walked: 40 feet  Assistive device utilized: None Level of assistance: Complete Independence Comments: L Trendelenburg gait with hip instability; L LE shorter than R    [facility] Adult PT Treatment:                                                DATE: 07/31/23 Therapeutic Exercise: Nustep L 6 x 6 min  Prone  Glut set 10 sec x 5 Quad stretch 30 sec x 2 R/L  Hip extension 5 sec x 10 R/L  Supine  Engaging core sucking belly button to back bone Piriformis stretch R 30 sec x 2 travell  Isometric hip abduction alternating LE's green TB 3 sec x 20 R/L  Bridging 5 sec x 10 focus on keeping hips straight  Bridging with green TB above knees 5 sec x 5 x 2  Sidelying  Hip abduction sidelying leading with heel 3 sec x 10 x 3 Sitting  Hip IR with ball btn knees yellow TB around ankles 3 sec x 10 x 2  Standing  SLS 20 sec x 3 R/L focus on holding hips tight and not dropping on R  Standing glut set  L side at wall press in with R LE 5 sec x 5  Side at wall for hip flexion/marching x 10 R/L    [facility] Adult PT Treatment:                                                 DATE: 07/29/2023 Therapeutic Exercise: Supine straight leg piriformis stretch Figure 4 LTR Bridges --> staggered stance bridges Side Lying: Clamshells + blue TB 2x15 Reverse clamshells + YTB 2x10 Hip extension in abduction 2x10 Small leg circle CW/CCW x 10 each Standing: Hip hike + small ball roll up wall x10 Lateral heel tap downs --> R SLS on 4" step, L SLS on 6" step Seated figure 4 stretch   [facility] Adult PT Treatment:                                                DATE: 07/24/2023 Therapeutic Exercise: LTR --> figure 4 LTR x10 Bent knee fall out + GTB & TA activation Bridges + GTB hip abd iso --> bridges + clamshells blue TB Side Lying: Clamshells Bent knee in ER hip abd Glute med leg raises Small circles in abd Hip extension in abduction Standing hip hikes --> lift underneath L LE   PATIENT EDUCATION:  Education details: POC; HEP  Person educated: Patient Education method: Programmer, multimedia, Facilities manager, Actor cues, Verbal cues, and Handouts Education comprehension: verbalized understanding, returned demonstration, verbal cues required, tactile cues required, and needs further education  HOME EXERCISE PROGRAM: Access Code: [email or web address] URL: [email or web address] Date: 07/24/2023 Prepared  by: Carlynn Herald  Exercises - Prone Gluteal Sets  - 1 x daily - 7 x weekly - 1 sets - 10 reps - 10 sec  hold - Prone Quadriceps Stretch with Strap  - 1 x daily - 7 x weekly - 1 sets - 3 reps - 30 sec  hold - Prone Hip Extension  - 1 x daily - 7 x weekly - 1 sets - 10 reps - 3 sec  hold - Supine Piriformis Stretch with Leg Straight  - 1 x daily - 7 x weekly - 1 sets - 3 reps - 30 sec  hold - Hooklying Isometric Clamshell  - 1 x daily - 7 x weekly - 1 sets - 10 reps - 3 sec  hold - Supine Bridge with Resistance Band  - 1 x daily - 7 x weekly - 1-2 sets - 10 reps - 5-10 sec  hold - Sidelying Hip Abduction  - 1 x daily - 7 x weekly - 3 sets  - 10 reps - 3-5 sec  hold - Seated Hip Internal Rotation with Ball and Resistance  - 2 x daily - 7 x weekly - 1 sets - 10 reps - 3 sec  hold - Single Leg Stance with Support  - 2 x daily - 7 x weekly - 1 sets - 3 reps - 30 sec  hold - Standing Hip Hiking  - 1 x daily - 7 x weekly - 3 sets - 10 reps - Sidelying Hip Extension in Abduction  - 1 x daily - 7 x weekly - 3 sets - 10 reps - Sidelying Hip Extension in Abduction with Knee Bent  - 1 x daily - 7 x weekly - 3 sets - 10 reps  ASSESSMENT:  CLINICAL IMPRESSION: Flare up of pain from walking up spiral stairs Friday. Symptoms are gradually improving but she continues to have some sharp pain when she first stars to walk and sometimes when she is fatigued. Note palpable tightness in the R posterior hip. Added STM for gluts and piriformis. Continued with stretching and strengthening.  Improving gait pattern noted.   Eval: Patient is a 72 y.o. female who was seen today for physical therapy evaluation and treatment for bilat LE weakness and bilat hip pain with symptoms present over the past 6-8 months. 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 She presents today with L Trendelenburg gait pattern with hip instability and pain with standing and walking > 10-15 min. Patient has decreased hip and knee ROM; decreased strength bilat LEs; abnormal gait pattern; pain with functional activities. Patient will benefit from PT to address problems identified.   OBJECTIVE IMPAIRMENTS: Abnormal gait, decreased activity tolerance, decreased balance, decreased mobility, decreased ROM, decreased strength, hypomobility, increased fascial restrictions, improper body mechanics, postural dysfunction, and pain.    GOALS: Goals reviewed with patient? Yes  SHORT TERM GOALS: Target date: 08/28/2023  Independent in initial HEP  Baseline: Goal status: INITIAL  2.  Improve standing alignment and posture  Baseline:  Goal status:  INITIAL   LONG TERM GOALS: Target date: 10/09/2023   Increase ROM R hip rotation with patient to demonstrated good I/ER range  Baseline:  Goal status: INITIAL  2.  4+ to 5/5 strength bilat LE's  Baseline:  Goal status: INITIAL  3.  Improve gait stability with less evidence of gait instability  Baseline:  Goal status: INITIAL  4.  Patient reports ability to walk for  20-30 min with minimal to no increase in pain  Baseline:  Goal status: INITIAL  5.  Decrease pain in bilat hips by 50-70% allowing patient to increase activity level  Baseline:  Goal status: INITIAL  6.  Independent in HEP, including aquatic program as indicated  Baseline:  Goal status: INITIAL    7. Improve functional limitation score to 53    Baseline: 40    Goal status: INITIAL   PLAN:  PT FREQUENCY: 2x/week  PT DURATION: 12 weeks  PLANNED INTERVENTIONS: 97164- PT Re-evaluation, 97110-Therapeutic exercises, 97530- Therapeutic activity, 97112- Neuromuscular re-education, 97535- Self Care, 13244- Manual therapy, [id number]- Gait training, [phone]- Aquatic Therapy, 97014- Electrical stimulation (unattended), 97035- Ultrasound, 25366- Ionotophoresis 4mg /ml Dexamethasone, Patient/Family education, Balance training, Stair training, Taping, Dry Needling, Joint mobilization, Spinal mobilization, Cryotherapy, and Moist heat  PLAN FOR NEXT SESSION: Progress exercises; continue education and home instruction; manual work, DN, modalities as indicated. Further assess leg length and gait speed as indicated. Consider aquatic program as ulcer is fully healed.     Val Riles, PT 07/31/2023, 2:04 PM

## 2023-08-05 ENCOUNTER — Ambulatory Visit: Payer: Medicare HMO

## 2023-08-05 DIAGNOSIS — R29898 Other symptoms and signs involving the musculoskeletal system: Secondary | ICD-10-CM | POA: Diagnosis not present

## 2023-08-05 DIAGNOSIS — R2689 Other abnormalities of gait and mobility: Secondary | ICD-10-CM

## 2023-08-05 DIAGNOSIS — M25551 Pain in right hip: Secondary | ICD-10-CM | POA: Diagnosis not present

## 2023-08-05 DIAGNOSIS — M25552 Pain in left hip: Secondary | ICD-10-CM | POA: Diagnosis not present

## 2023-08-05 DIAGNOSIS — M6281 Muscle weakness (generalized): Secondary | ICD-10-CM

## 2023-08-05 NOTE — Therapy (Signed)
OUTPATIENT PHYSICAL THERAPY LOWER EXTREMITY TREATMENT   Patient Name: Teresa Spence MRN: [id number] DOB:06/03/1951, 72 y.o., female Today's Date: 08/05/2023  END OF SESSION:  PT End of Session - 08/05/23 1404     Visit Number 6    Number of Visits 24    Date for PT Re-Evaluation 10/09/23    Authorization Type [employer] medicare copay $20    Authorization Time Period 16 VISITS APPROVED FOR PT 07/08/2023-09/06/2023    Authorization - Visit Number 6    Authorization - Number of Visits 16    Progress Note Due on Visit 10    PT Start Time 1403    PT Stop Time 1443    PT Time Calculation (min) 40 min    Activity Tolerance Patient tolerated treatment well    Behavior During Therapy [facility] for tasks assessed/performed            Past Medical History:  Diagnosis Date   Allergy 1975   Environmental   Arthritis 1985   Bilateral carpal tunnel syndrome 01/20/2020   Diabetes mellitus without complication (HCC)    Heart murmur 105w   Hypertension    Past Surgical History:  Procedure Laterality Date   BUNIONECTOMY Right 06/10/2019   HAMMER TOE SURGERY     JOINT REPLACEMENT  2015   MEDIAL PARTIAL KNEE REPLACEMENT     TONSILLECTOMY     Patient Active Problem List   Diagnosis Date Noted   Microalbuminuria 06/12/2023   Pain in both thighs 02/26/2023   Plantar callus 11/23/2022   Left hip pain 11/23/2022   Type 2 diabetes mellitus with diabetic polyneuropathy, without long-term current use of insulin (HCC) 08/28/2021   CKD (chronic kidney disease) stage 2, GFR 60-89 ml/min 08/28/2021   Toe ulcer, left, with necrosis of muscle (HCC) 08/24/2020   Open toe wound, sequela 08/23/2020   Hyperlipidemia LDL goal <70 08/23/2020   Bilateral carpal tunnel syndrome 01/20/2020   DDD (degenerative disc disease), cervical 12/01/2019   Fine motor skill loss 12/01/2019   Tinnitus, left 09/14/2019   ETD (Eustachian tube dysfunction), bilateral 09/14/2019   Neuropathy 06/03/2019   Post-menopausal  06/03/2019   Generalized osteoarthritis of hand 09/11/2018   Colon polyps 05/21/2018   Hyperlipidemia associated with type 2 diabetes mellitus (HCC) 05/06/2018   Seborrheic keratoses 05/06/2018   Dermoid cyst of right ear 05/06/2018   Chest tightness 05/06/2018   Toenail fungus 10/25/2017   Numbness and tingling in both hands 05/08/2017   Primary osteoarthritis involving multiple joints 05/08/2017   Cellulitis of toe of left foot 12/29/2016   Absent pedal pulses 12/29/2016   Essential hypertension 12/27/2016   Uncontrolled type II diabetes with stage 2 chronic kidney disease 12/27/2016    PCP: Lesly Rubenstein L. Caleen Essex PA-C  REFERRING PROVIDER: Lonna Cobb Breeback, PA-C  REFERRING DIAG: Bilat leg weakness; bilat hip pain  THERAPY DIAG:  Pain of both hip joints  Muscle weakness (generalized)  Other abnormalities of gait and mobility  Other symptoms and signs involving the musculoskeletal system  Rationale for Evaluation and Treatment: Rehabilitation  ONSET DATE: 11/09/22  SUBJECTIVE:   SUBJECTIVE STATEMENT: Patient reports she was walking up stairs and got halfway up when her R posterior hip started really hurting and she had to take the steps one at a time to get down.   EVAL: Patient reports that she has pain in both hips and weakness in both legs. She reports that she had bunion surgery ~ 3-4 years ago and ended up with a staff  infection in the foot requiring surgery to remove some of infected bone. She has had altered gait since the time of the surgery. She had a blood blister on L foot with debridement by MD 07/10/23. She is treating the wound for infection with oral antibiotics and changing dressing 3x/day with antibiotic ointment. Patient reports that she has stiffness when sitting more than one hour and she finds it difficult to stand upright. She has pain in the front of thighs and in hips with standing or walking. She has pain after walking ~ 15 min. Pain resolves after sitting  rest for a few minutes. She has difficulty going up and down stairs.   PERTINENT HISTORY: Bunion surgeries bilat feet ~ 3 yrs ago - total of 3 surgeries each foot; TKA L 2008; R 2009; HTN; arthritis; carpal tunnel surgery L; nerve release L elbow PAIN:  Are you having pain? Yes: NPRS scale: 6/10 with walking; has been up to 8/10 since climbing the stairs; up to 5-6/10 with standing and walking  Pain location: bilat hips R or L typically not both at the same time  Pain description: sharp  Aggravating factors: standing; walking  Relieving factors: sitting TENS   PRECAUTIONS: Other: diabetic ulcer L midfoot with necrosis of muscle tissue    WEIGHT BEARING RESTRICTIONS: No  FALLS:  Has patient fallen in last 6 months? Yes. Number of falls 1 - fell forward onto hands and hit L knee sore but no lasting injury   LIVING ENVIRONMENT: Lives with: lives with their family and lives alone Lives in: House/apartment Stairs: Yes: Internal: 14 steps; on right going up and External: 1 steps; none Has following equipment at home: Grab bars  OCCUPATION: retired for Scientist, research (medical) retired ~ 5 yrs ago; takes classes and is involved with various groups; sedentary; sits in soft sofa when at home   PATIENT GOALS: get stronger in legs; work on being able to get up and down steps; get rid of some of the pain   NEXT MD VISIT: 09/17/23  OBJECTIVE:  Note: Objective measures were completed at Evaluation unless otherwise noted.  DIAGNOSTIC FINDINGS: none available   PATIENT SURVEYS:  FOTO 40; goal 90   SENSATION: Numbness bilat feet following Morton's neuroma surgery years ago  EDEMA:  Min toward end of day   MUSCLE LENGTH: Hamstrings: Right 65 deg; Left 60 deg Tight hip flexors - unable to extend past neutral   POSTURE: rounded shoulders, forward head, increased thoracic kyphosis, flexed trunk , and weight shift right  PALPATION: Muscular tightness L posterior hip   LOWER EXTREMITY ROM:  Tight limited end range knee extension L > R        Tight R hip rotation   Active ROM Right eval Left eval  Hip flexion    Hip extension    Hip abduction    Hip adduction    Hip internal rotation    Hip external rotation    Knee flexion      PRONE  95 90  Knee extension    Ankle dorsiflexion    Ankle plantarflexion    Ankle inversion    Ankle eversion     (Blank rows = not tested)  LOWER EXTREMITY MMT:  MMT Right eval Left eval  Hip flexion 4+ 4-  Hip extension 4- 3+  Hip abduction 4 3  Hip adduction    Hip internal rotation    Hip external rotation    Knee flexion 5 5  Knee extension  5 5  Ankle dorsiflexion    Ankle plantarflexion    Ankle inversion    Ankle eversion     (Blank rows = not tested)  FUNCTIONAL TESTS:  5 times sit to stand: 13.5 sec hands crossed over chest  Leg length difference - L LE ~ 3/4 in shorter than R   GAIT: Distance walked: 40 feet  Assistive device utilized: None Level of assistance: Complete Independence Comments: L Trendelenburg gait with hip instability; L LE shorter than R    [facility] Adult PT Treatment:                                                DATE: 08/05/2023 Therapeutic Exercise: Supine piriformis stretch 3x30" (R) Modified figure 4 glute stretch (R) 2x30" Prone quad stretch w/strap 3x30" Prone hip extension --> straight leg 2x10, bent knee 2x10 Seated HS stretch (foot propped on block) Figure 4 LTR (B) S/L reverse clamshell 2x15 S/L straight leg hip abd in ER (glute med) 20 deg & 45 deg 2x15 each (B) Sitting Hip IR + YTB (ball b/w knees) 2x15 (B) Resisted side stepping + GTB above knees   [facility] Adult PT Treatment:                                                DATE: 07/31/23 Therapeutic Exercise: Nustep L 6 x 6 min  Prone  Glut set 10 sec x 5 Quad stretch 30 sec x 2 R/L  Hip extension 5 sec x 10 R/L  Supine  Engaging core sucking belly button to back bone Piriformis stretch R 30 sec x 2 travell  Isometric  hip abduction alternating LE's green TB 3 sec x 20 R/L  Bridging 5 sec x 10 focus on keeping hips straight  Bridging with green TB above knees 5 sec x 5 x 2  Sidelying  Hip abduction sidelying leading with heel 3 sec x 10 x 3 Sitting  Hip IR with ball btn knees yellow TB around ankles 3 sec x 10 x 2  Standing  SLS 20 sec x 3 R/L focus on holding hips tight and not dropping on R  Standing glut set  L side at wall press in with R LE 5 sec x 5  Side at wall for hip flexion/marching x 10 R/L    [facility] Adult PT Treatment:                                                DATE: 07/29/2023 Therapeutic Exercise: Supine straight leg piriformis stretch Figure 4 LTR Bridges --> staggered stance bridges Side Lying: Clamshells + blue TB 2x15 Reverse clamshells + YTB 2x10 Hip extension in abduction 2x10 Small leg circle CW/CCW x 10 each Standing: Hip hike + small ball roll up wall x10 Lateral heel tap downs --> R SLS on 4" step, L SLS on 6" step Seated figure 4 stretch    PATIENT EDUCATION:  Education details: Updated HEP  Person educated: Patient Education method: Explanation, Demonstration, Tactile cues, Verbal cues, and Handouts Education comprehension: verbalized understanding, returned demonstration, verbal cues  required, tactile cues required, and needs further education  HOME EXERCISE PROGRAM: Access Code: [email or web address] URL: [email or web address] Date: 08/05/2023 Prepared by: Carlynn Herald  Exercises - Prone Gluteal Sets  - 1 x daily - 7 x weekly - 1 sets - 10 reps - 10 sec  hold - Prone Quadriceps Stretch with Strap  - 1 x daily - 7 x weekly - 1 sets - 3 reps - 30 sec  hold - Prone Hip Extension  - 1 x daily - 7 x weekly - 1 sets - 10 reps - 3 sec  hold - Supine Piriformis Stretch with Leg Straight  - 1 x daily - 7 x weekly - 1 sets - 3 reps - 30 sec  hold - Hooklying Isometric Clamshell  - 1 x daily - 7 x weekly - 1 sets - 10 reps - 3 sec  hold - Supine Bridge with  Resistance Band  - 1 x daily - 7 x weekly - 1-2 sets - 10 reps - 5-10 sec  hold - Sidelying Hip Abduction  - 1 x daily - 7 x weekly - 3 sets - 10 reps - 3-5 sec  hold - Seated Hip Internal Rotation with Ball and Resistance  - 2 x daily - 7 x weekly - 1 sets - 10 reps - 3 sec  hold - Single Leg Stance with Support  - 2 x daily - 7 x weekly - 1 sets - 3 reps - 30 sec  hold - Standing Hip Hiking  - 1 x daily - 7 x weekly - 3 sets - 10 reps - Sidelying Hip Extension in Abduction  - 1 x daily - 7 x weekly - 3 sets - 10 reps - Sidelying Hip Extension in Abduction with Knee Bent  - 1 x daily - 7 x weekly - 3 sets - 10 reps - Side Stepping with Resistance at Thighs  - 1 x daily - 7 x weekly - 3 sets - 10 reps - Prone Heel Squeeze  - 1 x daily - 7 x weekly - 3 sets - 10 reps - 5 sec hold  ASSESSMENT:  CLINICAL IMPRESSION: Hip strengthening continued, focusing on hip internal rotation mobility and challenging glute med strength. Patient able to complete all exercises with no exacerbation of pain. Resisted side stepping incorporated to progress hip/glute stability.  Eval: Patient is a 73 y.o. female who was seen today for physical therapy evaluation and treatment for bilat LE weakness and bilat hip pain with symptoms present over the past 6-8 months. 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 She presents today with L Trendelenburg gait pattern with hip instability and pain with standing and walking > 10-15 min. Patient has decreased hip and knee ROM; decreased strength bilat LEs; abnormal gait pattern; pain with functional activities. Patient will benefit from PT to address problems identified.   OBJECTIVE IMPAIRMENTS: Abnormal gait, decreased activity tolerance, decreased balance, decreased mobility, decreased ROM, decreased strength, hypomobility, increased fascial restrictions, improper body mechanics, postural dysfunction, and pain.    GOALS: Goals reviewed  with patient? Yes  SHORT TERM GOALS: Target date: 08/28/2023  Independent in initial HEP  Baseline: Goal status: INITIAL  2.  Improve standing alignment and posture  Baseline:  Goal status: INITIAL   LONG TERM GOALS: Target date: 10/09/2023  Increase ROM R hip rotation with patient to demonstrated good I/ER range  Baseline:  Goal status: INITIAL  2.  4+  to 5/5 strength bilat LE's  Baseline:  Goal status: INITIAL  3.  Improve gait stability with less evidence of gait instability  Baseline:  Goal status: INITIAL  4.  Patient reports ability to walk for 20-30 min with minimal to no increase in pain  Baseline:  Goal status: INITIAL  5.  Decrease pain in bilat hips by 50-70% allowing patient to increase activity level  Baseline:  Goal status: INITIAL  6.  Independent in HEP, including aquatic program as indicated  Baseline:  Goal status: INITIAL    7. Improve functional limitation score to 53    Baseline: 40    Goal status: INITIAL   PLAN:  PT FREQUENCY: 2x/week  PT DURATION: 12 weeks  PLANNED INTERVENTIONS: 97164- PT Re-evaluation, 97110-Therapeutic exercises, 97530- Therapeutic activity, 97112- Neuromuscular re-education, 97535- Self Care, 78295- Manual therapy, [id number]- Gait training, [phone]- Aquatic Therapy, 97014- Electrical stimulation (unattended), 97035- Ultrasound, 86578- Ionotophoresis 4mg /ml Dexamethasone, Patient/Family education, Balance training, Stair training, Taping, Dry Needling, Joint mobilization, Spinal mobilization, Cryotherapy, and Moist heat  PLAN FOR NEXT SESSION: Progress exercises; continue education and home instruction; manual work, DN, modalities as indicated. Further assess leg length and gait speed as indicated (ask Celyn). Consider aquatic program as ulcer is fully healed.     Sanjuana Mae, PTA 08/05/2023, 2:53 PM

## 2023-08-07 ENCOUNTER — Ambulatory Visit: Payer: Medicare HMO | Admitting: Physical Therapy

## 2023-08-07 ENCOUNTER — Ambulatory Visit: Payer: Medicare HMO

## 2023-08-07 ENCOUNTER — Encounter: Payer: Self-pay | Admitting: Physical Therapy

## 2023-08-07 DIAGNOSIS — M6281 Muscle weakness (generalized): Secondary | ICD-10-CM

## 2023-08-07 DIAGNOSIS — M25551 Pain in right hip: Secondary | ICD-10-CM

## 2023-08-07 DIAGNOSIS — M25552 Pain in left hip: Secondary | ICD-10-CM | POA: Diagnosis not present

## 2023-08-07 DIAGNOSIS — R29898 Other symptoms and signs involving the musculoskeletal system: Secondary | ICD-10-CM

## 2023-08-07 DIAGNOSIS — R2689 Other abnormalities of gait and mobility: Secondary | ICD-10-CM

## 2023-08-07 NOTE — Therapy (Signed)
OUTPATIENT PHYSICAL THERAPY LOWER EXTREMITY TREATMENT   Patient Name: Teresa Spence MRN: [id number] DOB:[birth year], 72 y.o., female Today's Date: 08/07/2023  END OF SESSION:  PT End of Session - 08/07/23 0923     Visit Number 7    Number of Visits 24    Date for PT Re-Evaluation 10/09/23    Authorization Type [employer] medicare copay $20    Authorization Time Period 16 VISITS APPROVED FOR PT 07/08/2023-09/06/2023    Authorization - Visit Number 7    Authorization - Number of Visits 16    Progress Note Due on Visit 10    PT Start Time 0845    PT Stop Time 0925    PT Time Calculation (min) 40 min    Activity Tolerance Patient tolerated treatment well    Behavior During Therapy [facility] for tasks assessed/performed             Past Medical History:  Diagnosis Date   Allergy 1975   Environmental   Arthritis 1985   Bilateral carpal tunnel syndrome 01/20/2020   Diabetes mellitus without complication (HCC)    Heart murmur 105w   Hypertension    Past Surgical History:  Procedure Laterality Date   BUNIONECTOMY Right 06/10/2019   HAMMER TOE SURGERY     JOINT REPLACEMENT  2015   MEDIAL PARTIAL KNEE REPLACEMENT     TONSILLECTOMY     Patient Active Problem List   Diagnosis Date Noted   Microalbuminuria 06/12/2023   Pain in both thighs 02/26/2023   Plantar callus 11/23/2022   Left hip pain 11/23/2022   Type 2 diabetes mellitus with diabetic polyneuropathy, without long-term current use of insulin (HCC) 08/28/2021   CKD (chronic kidney disease) stage 2, GFR 60-89 ml/min 08/28/2021   Toe ulcer, left, with necrosis of muscle (HCC) 08/24/2020   Open toe wound, sequela 08/23/2020   Hyperlipidemia LDL goal <70 08/23/2020   Bilateral carpal tunnel syndrome 01/20/2020   DDD (degenerative disc disease), cervical 12/01/2019   Fine motor skill loss 12/01/2019   Tinnitus, left 09/14/2019   ETD (Eustachian tube dysfunction), bilateral 09/14/2019   Neuropathy 06/03/2019   Post-menopausal  06/03/2019   Generalized osteoarthritis of hand 09/11/2018   Colon polyps 05/21/2018   Hyperlipidemia associated with type 2 diabetes mellitus (HCC) 05/06/2018   Seborrheic keratoses 05/06/2018   Dermoid cyst of right ear 05/06/2018   Chest tightness 05/06/2018   Toenail fungus 10/25/2017   Numbness and tingling in both hands 05/08/2017   Primary osteoarthritis involving multiple joints 05/08/2017   Cellulitis of toe of left foot 12/29/2016   Absent pedal pulses 12/29/2016   Essential hypertension 12/27/2016   Uncontrolled type II diabetes with stage 2 chronic kidney disease 12/27/2016    PCP: Lesly Rubenstein L. Caleen Essex PA-C  REFERRING PROVIDER: Lonna Cobb Breeback, PA-C  REFERRING DIAG: Bilat leg weakness; bilat hip pain  THERAPY DIAG:  Pain of both hip joints  Muscle weakness (generalized)  Other abnormalities of gait and mobility  Other symptoms and signs involving the musculoskeletal system  Rationale for Evaluation and Treatment: Rehabilitation  ONSET DATE: 11/09/22  SUBJECTIVE:   SUBJECTIVE STATEMENT: Patient reports she is sore today because she cleaned her house all day yesterday  EVAL: Patient reports that she has pain in both hips and weakness in both legs. She reports that she had bunion surgery ~ 3-4 years ago and ended up with a staff infection in the foot requiring surgery to remove some of infected bone. She has had altered gait since the  time of the surgery. She had a blood blister on L foot with debridement by MD 07/10/23. She is treating the wound for infection with oral antibiotics and changing dressing 3x/day with antibiotic ointment. Patient reports that she has stiffness when sitting more than one hour and she finds it difficult to stand upright. She has pain in the front of thighs and in hips with standing or walking. She has pain after walking ~ 15 min. Pain resolves after sitting rest for a few minutes. She has difficulty going up and down stairs.   PERTINENT  HISTORY: Bunion surgeries bilat feet ~ 3 yrs ago - total of 3 surgeries each foot; TKA L 2008; R 2009; HTN; arthritis; carpal tunnel surgery L; nerve release L elbow PAIN:  Are you having pain? Yes: NPRS scale: 5/10 with walking; has been up to 8/10 since climbing the stairs; up to 5-6/10 with standing and walking  Pain location: bilat hips R or L typically not both at the same time  Pain description: sharp  Aggravating factors: standing; walking  Relieving factors: sitting TENS   PRECAUTIONS: Other: diabetic ulcer L midfoot with necrosis of muscle tissue    WEIGHT BEARING RESTRICTIONS: No  FALLS:  Has patient fallen in last 6 months? Yes. Number of falls 1 - fell forward onto hands and hit L knee sore but no lasting injury   LIVING ENVIRONMENT: Lives with: lives with their family and lives alone Lives in: House/apartment Stairs: Yes: Internal: 14 steps; on right going up and External: 1 steps; none Has following equipment at home: Grab bars  OCCUPATION: retired for Scientist, research (medical) retired ~ 5 yrs ago; takes classes and is involved with various groups; sedentary; sits in soft sofa when at home   PATIENT GOALS: get stronger in legs; work on being able to get up and down steps; get rid of some of the pain   NEXT MD VISIT: 09/17/23  OBJECTIVE:  Note: Objective measures were completed at Evaluation unless otherwise noted.  DIAGNOSTIC FINDINGS: none available   PATIENT SURVEYS:  FOTO 40; goal 3   SENSATION: Numbness bilat feet following Morton's neuroma surgery years ago  EDEMA:  Min toward end of day   MUSCLE LENGTH: Hamstrings: Right 65 deg; Left 60 deg Tight hip flexors - unable to extend past neutral   POSTURE: rounded shoulders, forward head, increased thoracic kyphosis, flexed trunk , and weight shift right  PALPATION: Muscular tightness L posterior hip   LOWER EXTREMITY ROM: Tight limited end range knee extension L > R        Tight R hip rotation   Active  ROM Right eval Left eval  Hip flexion    Hip extension    Hip abduction    Hip adduction    Hip internal rotation    Hip external rotation    Knee flexion      PRONE  95 90  Knee extension    Ankle dorsiflexion    Ankle plantarflexion    Ankle inversion    Ankle eversion     (Blank rows = not tested)  LOWER EXTREMITY MMT:  MMT Right eval Left eval  Hip flexion 4+ 4-  Hip extension 4- 3+  Hip abduction 4 3  Hip adduction    Hip internal rotation    Hip external rotation    Knee flexion 5 5  Knee extension 5 5  Ankle dorsiflexion    Ankle plantarflexion    Ankle inversion    Ankle  eversion     (Blank rows = not tested)  FUNCTIONAL TESTS:  5 times sit to stand: 13.5 sec hands crossed over chest  Leg length difference - L LE ~ 3/4 in shorter than R   GAIT: Distance walked: 40 feet  Assistive device utilized: None Level of assistance: Complete Independence Comments: L Trendelenburg gait with hip instability; L LE shorter than R   [facility] Adult PT Treatment:                                                DATE: 08/07/23 Therapeutic Exercise: Supine piriformis stretch 2 x 30 sec Supine figure 4 stretch for glute 2 x 30 sec Prone hip extension --> straight leg 2x10, bent knee 2x10 Reverse clam with ball between knees 2 x 15 Sidelying hip abd with ER 2 x 10 Captain morgans 10 x 3 sec hold bilat Hip hike rolling ball up wall x 10 bilat Side step green TB around knees Hip 3 way on slider x 10 bilat Lateral tap down 4'' step (limited by knee pain) Sitting Hip IR + YTB (ball b/w knees) 2x15 (B) Supine figure 4 LTR   [facility] Adult PT Treatment:                                                DATE: 08/05/2023 Therapeutic Exercise: Supine piriformis stretch 3x30" (R) Modified figure 4 glute stretch (R) 2x30" Prone quad stretch w/strap 3x30" Prone hip extension --> straight leg 2x10, bent knee 2x10 Seated HS stretch (foot propped on block) Figure 4 LTR (B) S/L reverse  clamshell 2x15 S/L straight leg hip abd in ER (glute med) 20 deg & 45 deg 2x15 each (B) Sitting Hip IR + YTB (ball b/w knees) 2x15 (B) Resisted side stepping + GTB above knees   [facility] Adult PT Treatment:                                                DATE: 07/31/23 Therapeutic Exercise: Nustep L 6 x 6 min  Prone  Glut set 10 sec x 5 Quad stretch 30 sec x 2 R/L  Hip extension 5 sec x 10 R/L  Supine  Engaging core sucking belly button to back bone Piriformis stretch R 30 sec x 2 travell  Isometric hip abduction alternating LE's green TB 3 sec x 20 R/L  Bridging 5 sec x 10 focus on keeping hips straight  Bridging with green TB above knees 5 sec x 5 x 2  Sidelying  Hip abduction sidelying leading with heel 3 sec x 10 x 3 Sitting  Hip IR with ball btn knees yellow TB around ankles 3 sec x 10 x 2  Standing  SLS 20 sec x 3 R/L focus on holding hips tight and not dropping on R  Standing glut set  L side at wall press in with R LE 5 sec x 5  Side at wall for hip flexion/marching x 10 R/L    PATIENT EDUCATION:  Education details: Updated HEP  Person educated: Patient Education method: Explanation, Demonstration, Tactile cues, Verbal  cues, and Handouts Education comprehension: verbalized understanding, returned demonstration, verbal cues required, tactile cues required, and needs further education  HOME EXERCISE PROGRAM: Access Code: [email or web address] URL: [email or web address] Date: 08/05/2023 Prepared by: Carlynn Herald  Exercises - Prone Gluteal Sets  - 1 x daily - 7 x weekly - 1 sets - 10 reps - 10 sec  hold - Prone Quadriceps Stretch with Strap  - 1 x daily - 7 x weekly - 1 sets - 3 reps - 30 sec  hold - Prone Hip Extension  - 1 x daily - 7 x weekly - 1 sets - 10 reps - 3 sec  hold - Supine Piriformis Stretch with Leg Straight  - 1 x daily - 7 x weekly - 1 sets - 3 reps - 30 sec  hold - Hooklying Isometric Clamshell  - 1 x daily - 7 x weekly - 1 sets - 10 reps - 3 sec   hold - Supine Bridge with Resistance Band  - 1 x daily - 7 x weekly - 1-2 sets - 10 reps - 5-10 sec  hold - Sidelying Hip Abduction  - 1 x daily - 7 x weekly - 3 sets - 10 reps - 3-5 sec  hold - Seated Hip Internal Rotation with Ball and Resistance  - 2 x daily - 7 x weekly - 1 sets - 10 reps - 3 sec  hold - Single Leg Stance with Support  - 2 x daily - 7 x weekly - 1 sets - 3 reps - 30 sec  hold - Standing Hip Hiking  - 1 x daily - 7 x weekly - 3 sets - 10 reps - Sidelying Hip Extension in Abduction  - 1 x daily - 7 x weekly - 3 sets - 10 reps - Sidelying Hip Extension in Abduction with Knee Bent  - 1 x daily - 7 x weekly - 3 sets - 10 reps - Side Stepping with Resistance at Thighs  - 1 x daily - 7 x weekly - 3 sets - 10 reps - Prone Heel Squeeze  - 1 x daily - 7 x weekly - 3 sets - 10 reps - 5 sec hold  ASSESSMENT:  CLINICAL IMPRESSION: Hip strengthening continued. Focused on standing therex to promote functional movement. Patient able to complete all exercises with no exacerbation of pain.   Eval: Patient is a 72 y.o. female who was seen today for physical therapy evaluation and treatment for bilat LE weakness and bilat hip pain with symptoms present over the past 6-8 months. 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 She presents today with L Trendelenburg gait pattern with hip instability and pain with standing and walking > 10-15 min. Patient has decreased hip and knee ROM; decreased strength bilat LEs; abnormal gait pattern; pain with functional activities. Patient will benefit from PT to address problems identified.   OBJECTIVE IMPAIRMENTS: Abnormal gait, decreased activity tolerance, decreased balance, decreased mobility, decreased ROM, decreased strength, hypomobility, increased fascial restrictions, improper body mechanics, postural dysfunction, and pain.    GOALS: Goals reviewed with patient? Yes  SHORT TERM GOALS: Target date:  08/28/2023  Independent in initial HEP  Baseline: Goal status: INITIAL  2.  Improve standing alignment and posture  Baseline:  Goal status: INITIAL   LONG TERM GOALS: Target date: 10/09/2023  Increase ROM R hip rotation with patient to demonstrated good I/ER range  Baseline:  Goal status: INITIAL  2.  4+ to 5/5 strength bilat LE's  Baseline:  Goal status: INITIAL  3.  Improve gait stability with less evidence of gait instability  Baseline:  Goal status: INITIAL  4.  Patient reports ability to walk for 20-30 min with minimal to no increase in pain  Baseline:  Goal status: INITIAL  5.  Decrease pain in bilat hips by 50-70% allowing patient to increase activity level  Baseline:  Goal status: INITIAL  6.  Independent in HEP, including aquatic program as indicated  Baseline:  Goal status: INITIAL    7. Improve functional limitation score to 53    Baseline: 40    Goal status: INITIAL   PLAN:  PT FREQUENCY: 2x/week  PT DURATION: 12 weeks  PLANNED INTERVENTIONS: 97164- PT Re-evaluation, 97110-Therapeutic exercises, 97530- Therapeutic activity, 97112- Neuromuscular re-education, 97535- Self Care, 19147- Manual therapy, [id number]- Gait training, [id number]- Aquatic Therapy, 97014- Electrical stimulation (unattended), 97035- Ultrasound, 21308- Ionotophoresis 4mg /ml Dexamethasone, Patient/Family education, Balance training, Stair training, Taping, Dry Needling, Joint mobilization, Spinal mobilization, Cryotherapy, and Moist heat  PLAN FOR NEXT SESSION: Progress exercises; continue education and home instruction; manual work, DN, modalities as indicated. Further assess leg length and gait speed as indicated (ask Celyn). Consider aquatic program as ulcer is fully healed.     Olden Klauer, PT 08/07/2023, 9:24 AM

## 2023-08-12 ENCOUNTER — Ambulatory Visit: Payer: Medicare HMO | Attending: Physician Assistant

## 2023-08-12 DIAGNOSIS — M25551 Pain in right hip: Secondary | ICD-10-CM | POA: Insufficient documentation

## 2023-08-12 DIAGNOSIS — R29898 Other symptoms and signs involving the musculoskeletal system: Secondary | ICD-10-CM | POA: Insufficient documentation

## 2023-08-12 DIAGNOSIS — R2689 Other abnormalities of gait and mobility: Secondary | ICD-10-CM | POA: Diagnosis not present

## 2023-08-12 DIAGNOSIS — M25552 Pain in left hip: Secondary | ICD-10-CM | POA: Diagnosis not present

## 2023-08-12 DIAGNOSIS — M6281 Muscle weakness (generalized): Secondary | ICD-10-CM | POA: Insufficient documentation

## 2023-08-12 NOTE — Therapy (Signed)
OUTPATIENT PHYSICAL THERAPY LOWER EXTREMITY TREATMENT   Patient Name: Teresa Spence MRN: [id number] DOB:[birth year], 72 y.o., female Today's Date: 08/12/2023  END OF SESSION:  PT End of Session - 08/12/23 1316     Visit Number 8    Number of Visits 24    Date for PT Re-Evaluation 10/09/23    Authorization Type [employer] medicare copay $20    Authorization Time Period 16 VISITS APPROVED FOR PT 07/08/2023-09/06/2023    Authorization - Visit Number 8    Authorization - Number of Visits 16    Progress Note Due on Visit 10    PT Start Time 1316    PT Stop Time 1355    PT Time Calculation (min) 39 min    Activity Tolerance Patient tolerated treatment well    Behavior During Therapy WFL for tasks assessed/performed             Past Medical History:  Diagnosis Date   Allergy 1975   Environmental   Arthritis 1985   Bilateral carpal tunnel syndrome 01/20/2020   Diabetes mellitus without complication (HCC)    Heart murmur 105w   Hypertension    Past Surgical History:  Procedure Laterality Date   BUNIONECTOMY Right 06/10/2019   HAMMER TOE SURGERY     JOINT REPLACEMENT  2015   MEDIAL PARTIAL KNEE REPLACEMENT     TONSILLECTOMY     Patient Active Problem List   Diagnosis Date Noted   Microalbuminuria 06/12/2023   Pain in both thighs 02/26/2023   Plantar callus 11/23/2022   Left hip pain 11/23/2022   Type 2 diabetes mellitus with diabetic polyneuropathy, without long-term current use of insulin (HCC) 08/28/2021   CKD (chronic kidney disease) stage 2, GFR 60-89 ml/min 08/28/2021   Toe ulcer, left, with necrosis of muscle (HCC) 08/24/2020   Open toe wound, sequela 08/23/2020   Hyperlipidemia LDL goal <70 08/23/2020   Bilateral carpal tunnel syndrome 01/20/2020   DDD (degenerative disc disease), cervical 12/01/2019   Fine motor skill loss 12/01/2019   Tinnitus, left 09/14/2019   ETD (Eustachian tube dysfunction), bilateral 09/14/2019   Neuropathy 06/03/2019   Post-menopausal  06/03/2019   Generalized osteoarthritis of hand 09/11/2018   Colon polyps 05/21/2018   Hyperlipidemia associated with type 2 diabetes mellitus (HCC) 05/06/2018   Seborrheic keratoses 05/06/2018   Dermoid cyst of right ear 05/06/2018   Chest tightness 05/06/2018   Toenail fungus 10/25/2017   Numbness and tingling in both hands 05/08/2017   Primary osteoarthritis involving multiple joints 05/08/2017   Cellulitis of toe of left foot 12/29/2016   Absent pedal pulses 12/29/2016   Essential hypertension 12/27/2016   Uncontrolled type II diabetes with stage 2 chronic kidney disease 12/27/2016    PCP: Lesly Rubenstein L. Caleen Essex PA-C  REFERRING PROVIDER: Lonna Cobb Breeback, PA-C  REFERRING DIAG: Bilat leg weakness; bilat hip pain  THERAPY DIAG:  Pain of both hip joints  Muscle weakness (generalized)  Other abnormalities of gait and mobility  Other symptoms and signs involving the musculoskeletal system  Rationale for Evaluation and Treatment: Rehabilitation  ONSET DATE: 11/09/22  SUBJECTIVE:   SUBJECTIVE STATEMENT:  Patient reports she was feeling fine until "I overdid it on Saturday" and is now in pain, states pain is greater on R hip as compared to L. Patient states she did some stairs and that seems to flare her up the most.   EVAL: Patient reports that she has pain in both hips and weakness in both legs. She reports that she had  bunion surgery ~ 3-4 years ago and ended up with a staff infection in the foot requiring surgery to remove some of infected bone. She has had altered gait since the time of the surgery. She had a blood blister on L foot with debridement by MD 07/10/23. She is treating the wound for infection with oral antibiotics and changing dressing 3x/day with antibiotic ointment. Patient reports that she has stiffness when sitting more than one hour and she finds it difficult to stand upright. She has pain in the front of thighs and in hips with standing or walking. She has pain  after walking ~ 15 min. Pain resolves after sitting rest for a few minutes. She has difficulty going up and down stairs.   PERTINENT HISTORY: Bunion surgeries bilat feet ~ 3 yrs ago - total of 3 surgeries each foot; TKA L 2008; R 2009; HTN; arthritis; carpal tunnel surgery L; nerve release L elbow PAIN:  Are you having pain? Yes: NPRS scale: 5/10 with walking; has been up to 8/10 since climbing the stairs; up to 5-6/10 with standing and walking  Pain location: bilat hips R or L typically not both at the same time  Pain description: sharp  Aggravating factors: standing; walking  Relieving factors: sitting TENS   PRECAUTIONS: Other: diabetic ulcer L midfoot with necrosis of muscle tissue    WEIGHT BEARING RESTRICTIONS: No  FALLS:  Has patient fallen in last 6 months? Yes. Number of falls 1 - fell forward onto hands and hit L knee sore but no lasting injury   LIVING ENVIRONMENT: Lives with: lives with their family and lives alone Lives in: House/apartment Stairs: Yes: Internal: 14 steps; on right going up and External: 1 steps; none Has following equipment at home: Grab bars  OCCUPATION: retired for Scientist, research (medical) retired ~ 5 yrs ago; takes classes and is involved with various groups; sedentary; sits in soft sofa when at home   PATIENT GOALS: get stronger in legs; work on being able to get up and down steps; get rid of some of the pain   NEXT MD VISIT: 09/17/23  OBJECTIVE:  Note: Objective measures were completed at Evaluation unless otherwise noted.  DIAGNOSTIC FINDINGS: none available   PATIENT SURVEYS:  FOTO 40; goal 73   SENSATION: Numbness bilat feet following Morton's neuroma surgery years ago  EDEMA:  Min toward end of day   MUSCLE LENGTH: Hamstrings: Right 65 deg; Left 60 deg Tight hip flexors - unable to extend past neutral   POSTURE: rounded shoulders, forward head, increased thoracic kyphosis, flexed trunk , and weight shift right  PALPATION: Muscular  tightness L posterior hip   LOWER EXTREMITY ROM: Tight limited end range knee extension L > R        Tight R hip rotation   Active ROM Right eval Left eval  Hip flexion    Hip extension    Hip abduction    Hip adduction    Hip internal rotation    Hip external rotation    Knee flexion      PRONE  95 90  Knee extension    Ankle dorsiflexion    Ankle plantarflexion    Ankle inversion    Ankle eversion     (Blank rows = not tested)  LOWER EXTREMITY MMT:  MMT Right eval Left eval  Hip flexion 4+ 4-  Hip extension 4- 3+  Hip abduction 4 3  Hip adduction    Hip internal rotation    Hip  external rotation    Knee flexion 5 5  Knee extension 5 5  Ankle dorsiflexion    Ankle plantarflexion    Ankle inversion    Ankle eversion     (Blank rows = not tested)  FUNCTIONAL TESTS:  5 times sit to stand: 13.5 sec hands crossed over chest  Leg length difference - L LE ~ 3/4 in shorter than R   GAIT: Distance walked: 40 feet  Assistive device utilized: None Level of assistance: Complete Independence Comments: L Trendelenburg gait with hip instability; L LE shorter than R    [facility] Adult PT Treatment:                                                DATE: 08/12/2023 Therapeutic Exercise: Figure 4 LTR Supine piriformis stretch + opp leg stretch Prone: Bent knee heel squeezes 10x5" Straight leg hip ext 2x10 Bent knee hip 2x10 Quad stretch w/strap 3x30" Standing: Hip hikes (rolling ball up wall) 3x10 (B) Hip abd + RTB 2x10 Side stepping + RTB crossed at ankles  HS curls + 3#AW 2x10 Hip ext on diagonal + 3#AW 2x10    [facility] Adult PT Treatment:                                                DATE: 08/07/23 Therapeutic Exercise: Supine piriformis stretch 2 x 30 sec Supine figure 4 stretch for glute 2 x 30 sec Prone hip extension --> straight leg 2x10, bent knee 2x10 Reverse clam with ball between knees 2 x 15 Sidelying hip abd with ER 2 x 10 Captain morgans 10 x 3 sec hold  bilat Hip hike rolling ball up wall x 10 bilat Side step green TB around knees Hip 3 way on slider x 10 bilat Lateral tap down 4'' step (limited by knee pain) Sitting Hip IR + YTB (ball b/w knees) 2x15 (B) Supine figure 4 LTR    [facility] Adult PT Treatment:                                                DATE: 08/05/2023 Therapeutic Exercise: Supine piriformis stretch 3x30" (R) Modified figure 4 glute stretch (R) 2x30" Prone quad stretch w/strap 3x30" Prone hip extension --> straight leg 2x10, bent knee 2x10 Seated HS stretch (foot propped on block) Figure 4 LTR (B) S/L reverse clamshell 2x15 S/L straight leg hip abd in ER (glute med) 20 deg & 45 deg 2x15 each (B) Sitting Hip IR + YTB (ball b/w knees) 2x15 (B) Resisted side stepping + GTB above knees   PATIENT EDUCATION:  Education details: Updated HEP  Person educated: Patient Education method: Explanation, Demonstration, Tactile cues, Verbal cues, and Handouts Education comprehension: verbalized understanding, returned demonstration, verbal cues required, tactile cues required, and needs further education  HOME EXERCISE PROGRAM: Access Code: [email or web address] URL: [email or web address] Date: 08/05/2023 Prepared by: Carlynn Herald  Exercises - Prone Gluteal Sets  - 1 x daily - 7 x weekly - 1 sets - 10 reps - 10 sec  hold - Prone Quadriceps Stretch with Strap  - 1 x  daily - 7 x weekly - 1 sets - 3 reps - 30 sec  hold - Prone Hip Extension  - 1 x daily - 7 x weekly - 1 sets - 10 reps - 3 sec  hold - Supine Piriformis Stretch with Leg Straight  - 1 x daily - 7 x weekly - 1 sets - 3 reps - 30 sec  hold - Hooklying Isometric Clamshell  - 1 x daily - 7 x weekly - 1 sets - 10 reps - 3 sec  hold - Supine Bridge with Resistance Band  - 1 x daily - 7 x weekly - 1-2 sets - 10 reps - 5-10 sec  hold - Sidelying Hip Abduction  - 1 x daily - 7 x weekly - 3 sets - 10 reps - 3-5 sec  hold - Seated Hip Internal Rotation with Ball and  Resistance  - 2 x daily - 7 x weekly - 1 sets - 10 reps - 3 sec  hold - Single Leg Stance with Support  - 2 x daily - 7 x weekly - 1 sets - 3 reps - 30 sec  hold - Standing Hip Hiking  - 1 x daily - 7 x weekly - 3 sets - 10 reps - Sidelying Hip Extension in Abduction  - 1 x daily - 7 x weekly - 3 sets - 10 reps - Sidelying Hip Extension in Abduction with Knee Bent  - 1 x daily - 7 x weekly - 3 sets - 10 reps - Side Stepping with Resistance at Thighs  - 1 x daily - 7 x weekly - 3 sets - 10 reps - Prone Heel Squeeze  - 1 x daily - 7 x weekly - 3 sets - 10 reps - 5 sec hold  ASSESSMENT:  CLINICAL IMPRESSION: Standing exercises progressed as tolerated with focus on glute med strengthening and LE stability exercises. Patient demonstrated difficulty maintaining neutral R LE alignment during hip extension (increased hip adduction).   Eval: Patient is a 72 y.o. female who was seen today for physical therapy evaluation and treatment for bilat LE weakness and bilat hip pain with symptoms present over the past 6-8 months. 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 She presents today with L Trendelenburg gait pattern with hip instability and pain with standing and walking > 10-15 min. Patient has decreased hip and knee ROM; decreased strength bilat LEs; abnormal gait pattern; pain with functional activities. Patient will benefit from PT to address problems identified.   OBJECTIVE IMPAIRMENTS: Abnormal gait, decreased activity tolerance, decreased balance, decreased mobility, decreased ROM, decreased strength, hypomobility, increased fascial restrictions, improper body mechanics, postural dysfunction, and pain.    GOALS: Goals reviewed with patient? Yes  SHORT TERM GOALS: Target date: 08/28/2023  Independent in initial HEP  Baseline: Goal status: INITIAL  2.  Improve standing alignment and posture  Baseline:  Goal status: INITIAL   LONG TERM GOALS:  Target date: 10/09/2023  Increase ROM R hip rotation with patient to demonstrated good I/ER range  Baseline:  Goal status: INITIAL  2.  4+ to 5/5 strength bilat LE's  Baseline:  Goal status: INITIAL  3.  Improve gait stability with less evidence of gait instability  Baseline:  Goal status: INITIAL  4.  Patient reports ability to walk for 20-30 min with minimal to no increase in pain  Baseline:  Goal status: INITIAL  5.  Decrease pain in bilat hips by  50-70% allowing patient to increase activity level  Baseline:  Goal status: INITIAL  6.  Independent in HEP, including aquatic program as indicated  Baseline:  Goal status: INITIAL    7. Improve functional limitation score to 53    Baseline: 40    Goal status: INITIAL   PLAN:  PT FREQUENCY: 2x/week  PT DURATION: 12 weeks  PLANNED INTERVENTIONS: 97164- PT Re-evaluation, 97110-Therapeutic exercises, 97530- Therapeutic activity, 97112- Neuromuscular re-education, 97535- Self Care, 64332- Manual therapy, [id number]- Gait training, [phone]- Aquatic Therapy, 97014- Electrical stimulation (unattended), 97035- Ultrasound, 41660- Ionotophoresis 4mg /ml Dexamethasone, Patient/Family education, Balance training, Stair training, Taping, Dry Needling, Joint mobilization, Spinal mobilization, Cryotherapy, and Moist heat  PLAN FOR NEXT SESSION: Progress exercises; continue education and home instruction; manual work, DN, modalities as indicated. Further assess leg length and gait speed as indicated (ask Celyn). Consider aquatic program as ulcer is fully healed.     Sanjuana Mae, PTA 08/12/2023, 1:55 PM

## 2023-08-14 ENCOUNTER — Ambulatory Visit: Payer: Medicare HMO | Admitting: Rehabilitative and Restorative Service Providers"

## 2023-08-14 ENCOUNTER — Encounter: Payer: Self-pay | Admitting: Rehabilitative and Restorative Service Providers"

## 2023-08-14 DIAGNOSIS — R29898 Other symptoms and signs involving the musculoskeletal system: Secondary | ICD-10-CM | POA: Diagnosis not present

## 2023-08-14 DIAGNOSIS — M6281 Muscle weakness (generalized): Secondary | ICD-10-CM | POA: Diagnosis not present

## 2023-08-14 DIAGNOSIS — M25551 Pain in right hip: Secondary | ICD-10-CM

## 2023-08-14 DIAGNOSIS — M25552 Pain in left hip: Secondary | ICD-10-CM | POA: Diagnosis not present

## 2023-08-14 DIAGNOSIS — R2689 Other abnormalities of gait and mobility: Secondary | ICD-10-CM | POA: Diagnosis not present

## 2023-08-14 NOTE — Therapy (Signed)
OUTPATIENT PHYSICAL THERAPY LOWER EXTREMITY TREATMENT   Patient Name: Julye Gorra MRN: [id number] DOB:09-11-1950, 72 y.o., female Today's Date: 08/14/2023  END OF SESSION:  PT End of Session - 08/14/23 0937     Visit Number 9    Number of Visits 24    Date for PT Re-Evaluation 10/09/23    Authorization Type [employer] medicare copay $20    Authorization Time Period 16 VISITS APPROVED FOR PT 07/08/2023-09/06/2023    Authorization - Number of Visits 16    Progress Note Due on Visit 10    PT Start Time 0936    PT Stop Time 1015    PT Time Calculation (min) 39 min    Activity Tolerance Patient tolerated treatment well    Behavior During Therapy [facility] for tasks assessed/performed              Past Medical History:  Diagnosis Date   Allergy 1975   Environmental   Arthritis 1985   Bilateral carpal tunnel syndrome 01/20/2020   Diabetes mellitus without complication (HCC)    Heart murmur 105w   Hypertension    Past Surgical History:  Procedure Laterality Date   BUNIONECTOMY Right 06/10/2019   HAMMER TOE SURGERY     JOINT REPLACEMENT  2015   MEDIAL PARTIAL KNEE REPLACEMENT     TONSILLECTOMY     Patient Active Problem List   Diagnosis Date Noted   Microalbuminuria 06/12/2023   Pain in both thighs 02/26/2023   Plantar callus 11/23/2022   Left hip pain 11/23/2022   Type 2 diabetes mellitus with diabetic polyneuropathy, without long-term current use of insulin (HCC) 08/28/2021   CKD (chronic kidney disease) stage 2, GFR 60-89 ml/min 08/28/2021   Toe ulcer, left, with necrosis of muscle (HCC) 08/24/2020   Open toe wound, sequela 08/23/2020   Hyperlipidemia LDL goal <70 08/23/2020   Bilateral carpal tunnel syndrome 01/20/2020   DDD (degenerative disc disease), cervical 12/01/2019   Fine motor skill loss 12/01/2019   Tinnitus, left 09/14/2019   ETD (Eustachian tube dysfunction), bilateral 09/14/2019   Neuropathy 06/03/2019   Post-menopausal 06/03/2019   Generalized  osteoarthritis of hand 09/11/2018   Colon polyps 05/21/2018   Hyperlipidemia associated with type 2 diabetes mellitus (HCC) 05/06/2018   Seborrheic keratoses 05/06/2018   Dermoid cyst of right ear 05/06/2018   Chest tightness 05/06/2018   Toenail fungus 10/25/2017   Numbness and tingling in both hands 05/08/2017   Primary osteoarthritis involving multiple joints 05/08/2017   Cellulitis of toe of left foot 12/29/2016   Absent pedal pulses 12/29/2016   Essential hypertension 12/27/2016   Uncontrolled type II diabetes with stage 2 chronic kidney disease 12/27/2016    PCP: Lesly Rubenstein L. Caleen Essex PA-C  REFERRING PROVIDER: Lonna Cobb Breeback, PA-C  REFERRING DIAG: Bilat leg weakness; bilat hip pain  THERAPY DIAG:  Pain of both hip joints  Muscle weakness (generalized)  Other abnormalities of gait and mobility  Other symptoms and signs involving the musculoskeletal system  Rationale for Evaluation and Treatment: Rehabilitation  ONSET DATE: 11/09/22  SUBJECTIVE:   SUBJECTIVE STATEMENT:  The right hip is more sore in the past 2 weeks -- when going up steps. She cannot lead with the R foot on steps.   EVAL: Patient reports that she has pain in both hips and weakness in both legs. She reports that she had bunion surgery ~ 3-4 years ago and ended up with a staff infection in the foot requiring surgery to remove some of infected bone. She  has had altered gait since the time of the surgery. She had a blood blister on L foot with debridement by MD 07/10/23. She is treating the wound for infection with oral antibiotics and changing dressing 3x/day with antibiotic ointment. Patient reports that she has stiffness when sitting more than one hour and she finds it difficult to stand upright. She has pain in the front of thighs and in hips with standing or walking. She has pain after walking ~ 15 min. Pain resolves after sitting rest for a few minutes. She has difficulty going up and down stairs.  PERTINENT  HISTORY: Bunion surgeries bilat feet ~ 3 yrs ago - total of 3 surgeries each foot; TKA L 2008; R 2009; HTN; arthritis; carpal tunnel surgery L; nerve release L elbow PAIN:  Are you having pain? Yes: NPRS scale: 5/10 with walking; has been up to 8/10 since climbing the stairs; up to 5-6/10 with standing and walking  Pain location: bilat hips R or L typically not both at the same time  Pain description: sharp  Aggravating factors: standing; walking  Relieving factors: sitting TENS  PRECAUTIONS: Other: diabetic ulcer L midfoot with necrosis of muscle tissue  WEIGHT BEARING RESTRICTIONS: No FALLS:  Has patient fallen in last 6 months? Yes. Number of falls 1 - fell forward onto hands and hit L knee sore but no lasting injury  PATIENT GOALS: get stronger in legs; work on being able to get up and down steps; get rid of some of the pain  NEXT MD VISIT: 09/17/23  OBJECTIVE:  Note: Objective measures were completed at Evaluation unless otherwise noted. DIAGNOSTIC FINDINGS: none available   PATIENT SURVEYS:  FOTO 40; goal 74   SENSATION: Numbness bilat feet following Morton's neuroma surgery years ago  MUSCLE LENGTH: Hamstrings: Right 65 deg; Left 60 deg Tight hip flexors - unable to extend past neutral   POSTURE: rounded shoulders, forward head, increased thoracic kyphosis, flexed trunk , and weight shift right  PALPATION: Muscular tightness L posterior hip   LOWER EXTREMITY ROM: Tight limited end range knee extension L > R        Tight R hip rotation  Active ROM Right eval Left eval Right 08/14/23 Left 08/14/23  Hip flexion      Hip extension      Hip abduction      Hip adduction      Hip internal rotation      Hip external rotation      Knee flexion      PRONE  95 90 105 100  Knee extension      Ankle dorsiflexion      Ankle plantarflexion      Ankle inversion      Ankle eversion       (Blank rows = not tested)  LOWER EXTREMITY MMT: MMT Right eval Left eval  Hip flexion  4+ 4-  Hip extension 4- 3+  Hip abduction 4 3  Hip adduction    Hip internal rotation    Hip external rotation    Knee flexion 5 5  Knee extension 5 5  Ankle dorsiflexion    Ankle plantarflexion    Ankle inversion    Ankle eversion     (Blank rows = not tested)  FUNCTIONAL TESTS:  5 times sit to stand: 13.5 sec hands crossed over chest  Leg length difference - L LE ~ 3/4 in shorter than R   GAIT: Distance walked: 40 feet  Assistive device utilized:  None Level of assistance: Complete Independence Comments: L Trendelenburg gait with hip instability; L LE shorter than R   [facility] Adult PT Treatment:                                                DATE: 08/14/23 Therapeutic Exercise: Supine Bridge with yoga block squeeze x 10 reps x 2 sets Lumbar rocking  Double knee to chest  Sidelying Reverse clam R LE and L LE x 12 reps (L side needs assist to raise against gravity) Prone Hip extension R (with L she compensates with R hip flexion into mat-- not pure anti-gravity motion) Glut squeeze with ball between knees Standing Posterior/lateral pillowcase slide engaging contralateral glut med x 12 reps Hip hike/depression with one LE on 4" step Backwards walking x 10 feet x 3 reps Squats x 10 reps Seated Hip IR x 10 reps x 2 sets with yoga block squeeze Sit<>stand x 5 reps, then stand to squat to chair x 10 reps without UE support  [facility] Adult PT Treatment:                                                DATE: 08/12/2023 Therapeutic Exercise: Figure 4 LTR Supine piriformis stretch + opp leg stretch Prone: Bent knee heel squeezes 10x5" Straight leg hip ext 2x10 Bent knee hip 2x10 Quad stretch w/strap 3x30" Standing: Hip hikes (rolling ball up wall) 3x10 (B) Hip abd + RTB 2x10 Side stepping + RTB crossed at ankles  HS curls + 3#AW 2x10 Hip ext on diagonal + 3#AW 2x10    [facility] Adult PT Treatment:                                                DATE: 08/07/23 Therapeutic  Exercise: Supine piriformis stretch 2 x 30 sec Supine figure 4 stretch for glute 2 x 30 sec Prone hip extension --> straight leg 2x10, bent knee 2x10 Reverse clam with ball between knees 2 x 15 Sidelying hip abd with ER 2 x 10 Captain morgans 10 x 3 sec hold bilat Hip hike rolling ball up wall x 10 bilat Side step green TB around knees Hip 3 way on slider x 10 bilat Lateral tap down 4'' step (limited by knee pain) Sitting Hip IR + YTB (ball b/w knees) 2x15 (B) Supine figure 4 LTR    [facility] Adult PT Treatment:                                                DATE: 08/05/2023 Therapeutic Exercise: Supine piriformis stretch 3x30" (R) Modified figure 4 glute stretch (R) 2x30" Prone quad stretch w/strap 3x30" Prone hip extension --> straight leg 2x10, bent knee 2x10 Seated HS stretch (foot propped on block) Figure 4 LTR (B) S/L reverse clamshell 2x15 S/L straight leg hip abd in ER (glute med) 20 deg & 45 deg 2x15 each (B) Sitting Hip IR + YTB (ball b/w knees) 2x15 (  B) Resisted side stepping + GTB above knees   PATIENT EDUCATION:  Education details: Updated HEP  Person educated: Patient Education method: Explanation, Demonstration, Tactile cues, Verbal cues, and Handouts Education comprehension: verbalized understanding, returned demonstration, verbal cues required, tactile cues required, and needs further education  HOME EXERCISE PROGRAM: Access Code: [email or web address] URL: [email or web address] Date: 08/05/2023 Prepared by: Carlynn Herald  Exercises - Prone Gluteal Sets  - 1 x daily - 7 x weekly - 1 sets - 10 reps - 10 sec  hold - Prone Quadriceps Stretch with Strap  - 1 x daily - 7 x weekly - 1 sets - 3 reps - 30 sec  hold - Prone Hip Extension  - 1 x daily - 7 x weekly - 1 sets - 10 reps - 3 sec  hold - Supine Piriformis Stretch with Leg Straight  - 1 x daily - 7 x weekly - 1 sets - 3 reps - 30 sec  hold - Hooklying Isometric Clamshell  - 1 x daily - 7 x weekly - 1 sets -  10 reps - 3 sec  hold - Supine Bridge with Resistance Band  - 1 x daily - 7 x weekly - 1-2 sets - 10 reps - 5-10 sec  hold - Sidelying Hip Abduction  - 1 x daily - 7 x weekly - 3 sets - 10 reps - 3-5 sec  hold - Seated Hip Internal Rotation with Ball and Resistance  - 2 x daily - 7 x weekly - 1 sets - 10 reps - 3 sec  hold - Single Leg Stance with Support  - 2 x daily - 7 x weekly - 1 sets - 3 reps - 30 sec  hold - Standing Hip Hiking  - 1 x daily - 7 x weekly - 3 sets - 10 reps - Sidelying Hip Extension in Abduction  - 1 x daily - 7 x weekly - 3 sets - 10 reps - Sidelying Hip Extension in Abduction with Knee Bent  - 1 x daily - 7 x weekly - 3 sets - 10 reps - Side Stepping with Resistance at Thighs  - 1 x daily - 7 x weekly - 3 sets - 10 reps - Prone Heel Squeeze  - 1 x daily - 7 x weekly - 3 sets - 10 reps - 5 sec hold  ASSESSMENT:  CLINICAL IMPRESSION: The patient reports her left leg had numbness yesterday (this has occurred at one other time in the past). She notes that sitting down reduces that sensation. Plan to continue progressing standing strengthening, and stability through hips and core to improve functional mobility. Pain in R hip resolved throughout therapy session.  Eval: Patient is a 72 y.o. female who was seen today for physical therapy evaluation and treatment for bilat LE weakness and bilat hip pain with symptoms present over the past 6-8 months. 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 She presents today with L Trendelenburg gait pattern with hip instability and pain with standing and walking > 10-15 min. Patient has decreased hip and knee ROM; decreased strength bilat LEs; abnormal gait pattern; pain with functional activities. Patient will benefit from PT to address problems identified.   OBJECTIVE IMPAIRMENTS: Abnormal gait, decreased activity tolerance, decreased balance, decreased mobility, decreased ROM, decreased strength,  hypomobility, increased fascial restrictions, improper body mechanics, postural dysfunction, and pain.    GOALS: Goals reviewed with patient? Yes  SHORT TERM GOALS: Target  date: 08/28/2023  Independent in initial HEP  Baseline: Goal status: INITIAL  2.  Improve standing alignment and posture  Baseline:  Goal status: INITIAL   LONG TERM GOALS: Target date: 10/09/2023  Increase ROM R hip rotation with patient to demonstrated good I/ER range  Baseline:  Goal status: INITIAL  2.  4+ to 5/5 strength bilat LE's  Baseline:  Goal status: INITIAL  3.  Improve gait stability with less evidence of gait instability  Baseline:  Goal status: INITIAL  4.  Patient reports ability to walk for 20-30 min with minimal to no increase in pain  Baseline:  Goal status: INITIAL  5.  Decrease pain in bilat hips by 50-70% allowing patient to increase activity level  Baseline:  Goal status: INITIAL  6.  Independent in HEP, including aquatic program as indicated  Baseline:  Goal status: INITIAL    7. Improve functional limitation score to 53    Baseline: 40    Goal status: INITIAL   PLAN:  PT FREQUENCY: 2x/week  PT DURATION: 12 weeks  PLANNED INTERVENTIONS: 97164- PT Re-evaluation, 97110-Therapeutic exercises, 97530- Therapeutic activity, [id number]- Neuromuscular re-education, 97535- Self Care, [id number]- Manual therapy, [id number]- Gait training, [phone]- Aquatic Therapy, 97014- Electrical stimulation (unattended), 97035- Ultrasound, 82956- Ionotophoresis 4mg /ml Dexamethasone, Patient/Family education, Balance training, Stair training, Taping, Dry Needling, Joint mobilization, Spinal mobilization, Cryotherapy, and Moist heat  PLAN FOR NEXT SESSION: Progress exercises; continue education and home instruction.  Further assess leg length and gait speed as indicated (ask Celyn). Consider aquatic program as ulcer is fully healed.     Luanna Weesner, PT 08/14/2023, 9:38 AM

## 2023-08-23 ENCOUNTER — Encounter (HOSPITAL_BASED_OUTPATIENT_CLINIC_OR_DEPARTMENT_OTHER): Payer: Self-pay | Admitting: Physical Therapy

## 2023-08-23 ENCOUNTER — Ambulatory Visit (HOSPITAL_BASED_OUTPATIENT_CLINIC_OR_DEPARTMENT_OTHER): Payer: Medicare HMO | Attending: Physician Assistant | Admitting: Physical Therapy

## 2023-08-23 DIAGNOSIS — M6281 Muscle weakness (generalized): Secondary | ICD-10-CM | POA: Diagnosis not present

## 2023-08-23 DIAGNOSIS — R2689 Other abnormalities of gait and mobility: Secondary | ICD-10-CM | POA: Insufficient documentation

## 2023-08-23 DIAGNOSIS — M25552 Pain in left hip: Secondary | ICD-10-CM | POA: Diagnosis not present

## 2023-08-23 DIAGNOSIS — M25551 Pain in right hip: Secondary | ICD-10-CM | POA: Insufficient documentation

## 2023-08-23 DIAGNOSIS — R29898 Other symptoms and signs involving the musculoskeletal system: Secondary | ICD-10-CM | POA: Diagnosis not present

## 2023-08-23 NOTE — Therapy (Signed)
OUTPATIENT PHYSICAL THERAPY LOWER EXTREMITY TREATMENT   Patient Name: Teresa Spence MRN: [id number] DOB:[birth year], 72 y.o., female Today's Date: 08/23/2023  END OF SESSION:  PT End of Session - 08/23/23 1351     Visit Number 10    Number of Visits 24    Date for PT Re-Evaluation 10/09/23    Authorization Type [employer] medicare copay $20    Authorization Time Period 16 VISITS APPROVED FOR PT 07/08/2023-09/06/2023    Authorization - Number of Visits 16    PT Start Time 1347    PT Stop Time 1429    PT Time Calculation (min) 42 min    Activity Tolerance Patient tolerated treatment well    Behavior During Therapy WFL for tasks assessed/performed              Past Medical History:  Diagnosis Date   Allergy 1975   Environmental   Arthritis 1985   Bilateral carpal tunnel syndrome 01/20/2020   Diabetes mellitus without complication (HCC)    Heart murmur 105w   Hypertension    Past Surgical History:  Procedure Laterality Date   BUNIONECTOMY Right 06/10/2019   HAMMER TOE SURGERY     JOINT REPLACEMENT  2015   MEDIAL PARTIAL KNEE REPLACEMENT     TONSILLECTOMY     Patient Active Problem List   Diagnosis Date Noted   Microalbuminuria 06/12/2023   Pain in both thighs 02/26/2023   Plantar callus 11/23/2022   Left hip pain 11/23/2022   Type 2 diabetes mellitus with diabetic polyneuropathy, without long-term current use of insulin (HCC) 08/28/2021   CKD (chronic kidney disease) stage 2, GFR 60-89 ml/min 08/28/2021   Toe ulcer, left, with necrosis of muscle (HCC) 08/24/2020   Open toe wound, sequela 08/23/2020   Hyperlipidemia LDL goal <70 08/23/2020   Bilateral carpal tunnel syndrome 01/20/2020   DDD (degenerative disc disease), cervical 12/01/2019   Fine motor skill loss 12/01/2019   Tinnitus, left 09/14/2019   ETD (Eustachian tube dysfunction), bilateral 09/14/2019   Neuropathy 06/03/2019   Post-menopausal 06/03/2019   Generalized osteoarthritis of hand 09/11/2018    Colon polyps 05/21/2018   Hyperlipidemia associated with type 2 diabetes mellitus (HCC) 05/06/2018   Seborrheic keratoses 05/06/2018   Dermoid cyst of right ear 05/06/2018   Chest tightness 05/06/2018   Toenail fungus 10/25/2017   Numbness and tingling in both hands 05/08/2017   Primary osteoarthritis involving multiple joints 05/08/2017   Cellulitis of toe of left foot 12/29/2016   Absent pedal pulses 12/29/2016   Essential hypertension 12/27/2016   Uncontrolled type II diabetes with stage 2 chronic kidney disease 12/27/2016    PCP: Lesly Rubenstein L. Caleen Essex PA-C  REFERRING PROVIDER: Lonna Cobb Breeback, PA-C  REFERRING DIAG: Bilat leg weakness; bilat hip pain  THERAPY DIAG:  Pain of both hip joints  Muscle weakness (generalized)  Other abnormalities of gait and mobility  Other symptoms and signs involving the musculoskeletal system  Rationale for Evaluation and Treatment: Rehabilitation  ONSET DATE: 11/09/22  SUBJECTIVE:   SUBJECTIVE STATEMENT:  The right hip has been sore.  She feels her biggest challenges she is finding with land therapy, is controlling LLE when rotating it when on belly, and cramping in hamstrings.    EVAL: Patient reports that she has pain in both hips and weakness in both legs. She reports that she had bunion surgery ~ 3-4 years ago and ended up with a staff infection in the foot requiring surgery to remove some of infected bone. She has had  altered gait since the time of the surgery. She had a blood blister on L foot with debridement by MD 07/10/23. She is treating the wound for infection with oral antibiotics and changing dressing 3x/day with antibiotic ointment. Patient reports that she has stiffness when sitting more than one hour and she finds it difficult to stand upright. She has pain in the front of thighs and in hips with standing or walking. She has pain after walking ~ 15 min. Pain resolves after sitting rest for a few minutes. She has difficulty going up  and down stairs.  PERTINENT HISTORY: Bunion surgeries bilat feet ~ 3 yrs ago - total of 3 surgeries each foot; TKA L 2008; R 2009; HTN; arthritis; carpal tunnel surgery L; nerve release L elbow PAIN:  Are you having pain? Yes: NPRS scale: 3/10 with walking;  Pain location: R hip  Pain description: sharp  Aggravating factors: standing; walking  Relieving factors: sitting TENS   PRECAUTIONS: Other: diabetic ulcer L midfoot with necrosis of muscle tissue  WEIGHT BEARING RESTRICTIONS: No FALLS:  Has patient fallen in last 6 months? Yes. Number of falls 1 - fell forward onto hands and hit L knee sore but no lasting injury  PATIENT GOALS: get stronger in legs; work on being able to get up and down steps; get rid of some of the pain  NEXT MD VISIT: 09/17/23  OBJECTIVE:  Note: Objective measures were completed at Evaluation unless otherwise noted. DIAGNOSTIC FINDINGS: none available   PATIENT SURVEYS:  FOTO 40; goal 30   SENSATION: Numbness bilat feet following Morton's neuroma surgery years ago  MUSCLE LENGTH: Hamstrings: Right 65 deg; Left 60 deg Tight hip flexors - unable to extend past neutral   POSTURE: rounded shoulders, forward head, increased thoracic kyphosis, flexed trunk , and weight shift right  PALPATION: Muscular tightness L posterior hip   LOWER EXTREMITY ROM: Tight limited end range knee extension L > R        Tight R hip rotation  Active ROM Right eval Left eval Right 08/14/23 Left 08/14/23  Hip flexion      Hip extension      Hip abduction      Hip adduction      Hip internal rotation      Hip external rotation      Knee flexion      PRONE  95 90 105 100  Knee extension      Ankle dorsiflexion      Ankle plantarflexion      Ankle inversion      Ankle eversion       (Blank rows = not tested)  LOWER EXTREMITY MMT: MMT Right eval Left eval  Hip flexion 4+ 4-  Hip extension 4- 3+  Hip abduction 4 3  Hip adduction    Hip internal rotation    Hip  external rotation    Knee flexion 5 5  Knee extension 5 5  Ankle dorsiflexion    Ankle plantarflexion    Ankle inversion    Ankle eversion     (Blank rows = not tested)  FUNCTIONAL TESTS:  5 times sit to stand: 13.5 sec hands crossed over chest  Leg length difference - L LE ~ 3/4 in shorter than R   GAIT: Distance walked: 40 feet  Assistive device utilized: None Level of assistance: Complete Independence Comments: L Trendelenburg gait with hip instability; L LE shorter than R    [facility] Adult PT Treatment:  DATE: 08/23/23 Pt seen for aquatic therapy today.  Treatment took place in water 3.5-4.75 ft in depth at the [facility] pool. Temp of water was 91.  Pt entered/exited the pool via stairs independent  with bilat rail. * intro to aquatic therapy principles and properties * unsupported walking forward/ backwards with cues * farmers carry with single yellow hand float, marching forward/backward  * UE on yellow hand floats:  3 way LE kick 2 x 5 *UE on wall: leg swings into hip abdct/ addct crossing midline;  single leg clams x 5 each, then 5 each alternating LEs * straddling noodle with UE on wall: cycling (trial with UE on hand floats-difficulty balancing),suspended hip abdct/addct; cross country ski with UE on wall   Pt requires the buoyancy and hydrostatic pressure of water for support, and to offload joints by unweighting joint load by at least 50 % in navel deep water and by at least 75-80% in chest to neck deep water.  Viscosity of the water is needed for resistance of strengthening. Water current perturbations provides challenge to standing balance requiring increased core activation.    [facility] Adult PT Treatment:                                                DATE: 08/14/23 Therapeutic Exercise: Supine Bridge with yoga block squeeze x 10 reps x 2 sets Lumbar rocking  Double knee to chest  Sidelying Reverse clam R LE and  L LE x 12 reps (L side needs assist to raise against gravity) Prone Hip extension R (with L she compensates with R hip flexion into mat-- not pure anti-gravity motion) Glut squeeze with ball between knees Standing Posterior/lateral pillowcase slide engaging contralateral glut med x 12 reps Hip hike/depression with one LE on 4" step Backwards walking x 10 feet x 3 reps Squats x 10 reps Seated Hip IR x 10 reps x 2 sets with yoga block squeeze Sit<>stand x 5 reps, then stand to squat to chair x 10 reps without UE support   [facility] Adult PT Treatment:                                                DATE: 08/12/2023 Therapeutic Exercise: Figure 4 LTR Supine piriformis stretch + opp leg stretch Prone: Bent knee heel squeezes 10x5" Straight leg hip ext 2x10 Bent knee hip 2x10 Quad stretch w/strap 3x30" Standing: Hip hikes (rolling ball up wall) 3x10 (B) Hip abd + RTB 2x10 Side stepping + RTB crossed at ankles  HS curls + 3#AW 2x10 Hip ext on diagonal + 3#AW 2x10    [facility] Adult PT Treatment:                                                DATE: 08/07/23 Therapeutic Exercise: Supine piriformis stretch 2 x 30 sec Supine figure 4 stretch for glute 2 x 30 sec Prone hip extension --> straight leg 2x10, bent knee 2x10 Reverse clam with ball between knees 2 x 15 Sidelying hip abd with ER 2 x 10 Captain morgans 10 x 3  sec hold bilat Hip hike rolling ball up wall x 10 bilat Side step green TB around knees Hip 3 way on slider x 10 bilat Lateral tap down 4'' step (limited by knee pain) Sitting Hip IR + YTB (ball b/w knees) 2x15 (B) Supine figure 4 LTR    [facility] Adult PT Treatment:                                                DATE: 08/05/2023 Therapeutic Exercise: Supine piriformis stretch 3x30" (R) Modified figure 4 glute stretch (R) 2x30" Prone quad stretch w/strap 3x30" Prone hip extension --> straight leg 2x10, bent knee 2x10 Seated HS stretch (foot propped on block) Figure 4 LTR  (B) S/L reverse clamshell 2x15 S/L straight leg hip abd in ER (glute med) 20 deg & 45 deg 2x15 each (B) Sitting Hip IR + YTB (ball b/w knees) 2x15 (B) Resisted side stepping + GTB above knees   PATIENT EDUCATION:  Education details: Updated HEP  Person educated: Patient Education method: Explanation, Demonstration, Tactile cues, Verbal cues, and Handouts Education comprehension: verbalized understanding, returned demonstration, verbal cues required, tactile cues required, and needs further education  HOME EXERCISE PROGRAM: Access Code: [email or web address] URL: [email or web address] Date: 08/05/2023 Prepared by: Carlynn Herald  Exercises - Prone Gluteal Sets  - 1 x daily - 7 x weekly - 1 sets - 10 reps - 10 sec  hold - Prone Quadriceps Stretch with Strap  - 1 x daily - 7 x weekly - 1 sets - 3 reps - 30 sec  hold - Prone Hip Extension  - 1 x daily - 7 x weekly - 1 sets - 10 reps - 3 sec  hold - Supine Piriformis Stretch with Leg Straight  - 1 x daily - 7 x weekly - 1 sets - 3 reps - 30 sec  hold - Hooklying Isometric Clamshell  - 1 x daily - 7 x weekly - 1 sets - 10 reps - 3 sec  hold - Supine Bridge with Resistance Band  - 1 x daily - 7 x weekly - 1-2 sets - 10 reps - 5-10 sec  hold - Sidelying Hip Abduction  - 1 x daily - 7 x weekly - 3 sets - 10 reps - 3-5 sec  hold - Seated Hip Internal Rotation with Ball and Resistance  - 2 x daily - 7 x weekly - 1 sets - 10 reps - 3 sec  hold - Single Leg Stance with Support  - 2 x daily - 7 x weekly - 1 sets - 3 reps - 30 sec  hold - Standing Hip Hiking  - 1 x daily - 7 x weekly - 3 sets - 10 reps - Sidelying Hip Extension in Abduction  - 1 x daily - 7 x weekly - 3 sets - 10 reps - Sidelying Hip Extension in Abduction with Knee Bent  - 1 x daily - 7 x weekly - 3 sets - 10 reps - Side Stepping with Resistance at Thighs  - 1 x daily - 7 x weekly - 3 sets - 10 reps - Prone Heel Squeeze  - 1 x daily - 7 x weekly - 3 sets - 10 reps - 5 sec  hold  ASSESSMENT:  CLINICAL IMPRESSION: Pt is confident in aquatic setting and able to take direction from therapist on deck.  Pt reported elimination of RLE pain while exercising in water.  Pt found it difficult to complete farmer carry with yellow hand float on L side.  Good tolerance for all other exercises.  Will continue to progress as tolerated over next 3 visits.  If pt foresees she will be joining pool, will issue laminated aquatic HEP in future session.     Eval: Patient is a 72 y.o. female who was seen today for physical therapy evaluation and treatment for bilat LE weakness and bilat hip pain with symptoms present over the past 6-8 months. 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 She presents today with L Trendelenburg gait pattern with hip instability and pain with standing and walking > 10-15 min. Patient has decreased hip and knee ROM; decreased strength bilat LEs; abnormal gait pattern; pain with functional activities. Patient will benefit from PT to address problems identified.   OBJECTIVE IMPAIRMENTS: Abnormal gait, decreased activity tolerance, decreased balance, decreased mobility, decreased ROM, decreased strength, hypomobility, increased fascial restrictions, improper body mechanics, postural dysfunction, and pain.    GOALS: Goals reviewed with patient? Yes  SHORT TERM GOALS: Target date: 08/28/2023  Independent in initial HEP  Baseline: Goal status: INITIAL  2.  Improve standing alignment and posture  Baseline:  Goal status: INITIAL   LONG TERM GOALS: Target date: 10/09/2023  Increase ROM R hip rotation with patient to demonstrated good I/ER range  Baseline:  Goal status: INITIAL  2.  4+ to 5/5 strength bilat LE's  Baseline:  Goal status: INITIAL  3.  Improve gait stability with less evidence of gait instability  Baseline:  Goal status: INITIAL  4.  Patient reports ability to walk for 20-30 min with minimal  to no increase in pain  Baseline:  Goal status: INITIAL  5.  Decrease pain in bilat hips by 50-70% allowing patient to increase activity level  Baseline:  Goal status: INITIAL  6.  Independent in HEP, including aquatic program as indicated  Baseline:  Goal status: INITIAL    7. Improve functional limitation score to 53    Baseline: 40    Goal status: INITIAL   PLAN:  PT FREQUENCY: 2x/week  PT DURATION: 12 weeks  PLANNED INTERVENTIONS: 97164- PT Re-evaluation, 97110-Therapeutic exercises, 97530- Therapeutic activity, 97112- Neuromuscular re-education, 97535- Self Care, 16109- Manual therapy, [id number]- Gait training, [id number]- Aquatic Therapy, 97014- Electrical stimulation (unattended), 97035- Ultrasound, 09811- Ionotophoresis 4mg /ml Dexamethasone, Patient/Family education, Balance training, Stair training, Taping, Dry Needling, Joint mobilization, Spinal mobilization, Cryotherapy, and Moist heat  PLAN FOR NEXT SESSION: Progress exercises; continue education and home instruction.  Further assess leg length and gait speed as indicated (ask Celyn). Consider aquatic program as ulcer is fully healed.    Mayer Camel, PTA 08/23/23 3:38 PM [facility] [address] [place], [place], [address] Phone: [phone]   Fax:  [phone]

## 2023-08-27 ENCOUNTER — Encounter: Payer: Self-pay | Admitting: Physical Therapy

## 2023-08-27 ENCOUNTER — Ambulatory Visit: Payer: Medicare HMO | Admitting: Physical Therapy

## 2023-08-27 DIAGNOSIS — M6281 Muscle weakness (generalized): Secondary | ICD-10-CM | POA: Diagnosis not present

## 2023-08-27 DIAGNOSIS — M25552 Pain in left hip: Secondary | ICD-10-CM | POA: Diagnosis not present

## 2023-08-27 DIAGNOSIS — M25551 Pain in right hip: Secondary | ICD-10-CM | POA: Diagnosis not present

## 2023-08-27 DIAGNOSIS — R2689 Other abnormalities of gait and mobility: Secondary | ICD-10-CM

## 2023-08-27 DIAGNOSIS — R29898 Other symptoms and signs involving the musculoskeletal system: Secondary | ICD-10-CM | POA: Diagnosis not present

## 2023-08-27 DIAGNOSIS — H524 Presbyopia: Secondary | ICD-10-CM | POA: Diagnosis not present

## 2023-08-27 NOTE — Therapy (Signed)
OUTPATIENT PHYSICAL THERAPY LOWER EXTREMITY TREATMENT AND 10th visit note   Patient Name: Teresa Spence MRN: [id number] DOB:1951-01-08, 72 y.o., female Today's Date: 08/27/2023 Dates of service: 07/17/23 - 08/27/23 END OF SESSION:  PT End of Session - 08/27/23 1221     Visit Number 11    Number of Visits 24    Date for PT Re-Evaluation 10/09/23    Authorization Type [employer] medicare copay $20    Authorization Time Period 16 VISITS APPROVED FOR PT 07/08/2023-09/06/2023    Authorization - Visit Number 11    Authorization - Number of Visits 16    Progress Note Due on Visit 20    PT Start Time 1151    PT Stop Time 1230    PT Time Calculation (min) 39 min    Activity Tolerance Patient tolerated treatment well    Behavior During Therapy WFL for tasks assessed/performed               Past Medical History:  Diagnosis Date   Allergy 1975   Environmental   Arthritis 1985   Bilateral carpal tunnel syndrome 01/20/2020   Diabetes mellitus without complication (HCC)    Heart murmur 105w   Hypertension    Past Surgical History:  Procedure Laterality Date   BUNIONECTOMY Right 06/10/2019   HAMMER TOE SURGERY     JOINT REPLACEMENT  2015   MEDIAL PARTIAL KNEE REPLACEMENT     TONSILLECTOMY     Patient Active Problem List   Diagnosis Date Noted   Microalbuminuria 06/12/2023   Pain in both thighs 02/26/2023   Plantar callus 11/23/2022   Left hip pain 11/23/2022   Type 2 diabetes mellitus with diabetic polyneuropathy, without long-term current use of insulin (HCC) 08/28/2021   CKD (chronic kidney disease) stage 2, GFR 60-89 ml/min 08/28/2021   Toe ulcer, left, with necrosis of muscle (HCC) 08/24/2020   Open toe wound, sequela 08/23/2020   Hyperlipidemia LDL goal <70 08/23/2020   Bilateral carpal tunnel syndrome 01/20/2020   DDD (degenerative disc disease), cervical 12/01/2019   Fine motor skill loss 12/01/2019   Tinnitus, left 09/14/2019   ETD (Eustachian tube dysfunction),  bilateral 09/14/2019   Neuropathy 06/03/2019   Post-menopausal 06/03/2019   Generalized osteoarthritis of hand 09/11/2018   Colon polyps 05/21/2018   Hyperlipidemia associated with type 2 diabetes mellitus (HCC) 05/06/2018   Seborrheic keratoses 05/06/2018   Dermoid cyst of right ear 05/06/2018   Chest tightness 05/06/2018   Toenail fungus 10/25/2017   Numbness and tingling in both hands 05/08/2017   Primary osteoarthritis involving multiple joints 05/08/2017   Cellulitis of toe of left foot 12/29/2016   Absent pedal pulses 12/29/2016   Essential hypertension 12/27/2016   Uncontrolled type II diabetes with stage 2 chronic kidney disease 12/27/2016    PCP: Lesly Rubenstein L. Caleen Essex PA-C  REFERRING PROVIDER: Lonna Cobb Breeback, PA-C  REFERRING DIAG: Bilat leg weakness; bilat hip pain  THERAPY DIAG:  Pain of both hip joints  Muscle weakness (generalized)  Other abnormalities of gait and mobility  Rationale for Evaluation and Treatment: Rehabilitation  ONSET DATE: 11/09/22  SUBJECTIVE:   SUBJECTIVE STATEMENT:  "The pool was fabulous". She states that if she keeps her hips "loose" she has less pain  EVAL: Patient reports that she has pain in both hips and weakness in both legs. She reports that she had bunion surgery ~ 3-4 years ago and ended up with a staff infection in the foot requiring surgery to remove some of infected bone. She  has had altered gait since the time of the surgery. She had a blood blister on L foot with debridement by MD 07/10/23. She is treating the wound for infection with oral antibiotics and changing dressing 3x/day with antibiotic ointment. Patient reports that she has stiffness when sitting more than one hour and she finds it difficult to stand upright. She has pain in the front of thighs and in hips with standing or walking. She has pain after walking ~ 15 min. Pain resolves after sitting rest for a few minutes. She has difficulty going up and down stairs.   PERTINENT HISTORY: Bunion surgeries bilat feet ~ 3 yrs ago - total of 3 surgeries each foot; TKA L 2008; R 2009; HTN; arthritis; carpal tunnel surgery L; nerve release L elbow PAIN:  Are you having pain? Yes: NPRS scale: 3/10 with walking;  Pain location: R hip  Pain description: sharp  Aggravating factors: standing; walking  Relieving factors: sitting TENS   PRECAUTIONS: Other: diabetic ulcer L midfoot with necrosis of muscle tissue  WEIGHT BEARING RESTRICTIONS: No FALLS:  Has patient fallen in last 6 months? Yes. Number of falls 1 - fell forward onto hands and hit L knee sore but no lasting injury  PATIENT GOALS: get stronger in legs; work on being able to get up and down steps; get rid of some of the pain  NEXT MD VISIT: 09/17/23  OBJECTIVE:  Note: Objective measures were completed at Evaluation unless otherwise noted. DIAGNOSTIC FINDINGS: none available   PATIENT SURVEYS:  FOTO 40; goal 64   SENSATION: Numbness bilat feet following Morton's neuroma surgery years ago  MUSCLE LENGTH: Hamstrings: Right 65 deg; Left 60 deg Tight hip flexors - unable to extend past neutral   POSTURE: rounded shoulders, forward head, increased thoracic kyphosis, flexed trunk , and weight shift right  PALPATION: Muscular tightness L posterior hip   LOWER EXTREMITY ROM: Tight limited end range knee extension L > R        Tight R hip rotation  Active ROM Right eval Left eval Right 08/14/23 Left 08/14/23  Hip flexion      Hip extension      Hip abduction      Hip adduction      Hip internal rotation      Hip external rotation      Knee flexion      PRONE  95 90 105 100  Knee extension      Ankle dorsiflexion      Ankle plantarflexion      Ankle inversion      Ankle eversion       (Blank rows = not tested)  LOWER EXTREMITY MMT: MMT Right eval Left eval  Hip flexion 4+ 4-  Hip extension 4- 3+  Hip abduction 4 3  Hip adduction    Hip internal rotation    Hip external rotation     Knee flexion 5 5  Knee extension 5 5  Ankle dorsiflexion    Ankle plantarflexion    Ankle inversion    Ankle eversion     (Blank rows = not tested)  FUNCTIONAL TESTS:  5 times sit to stand: 13.5 sec hands crossed over chest  Leg length difference - L LE ~ 3/4 in shorter than R   GAIT: Distance walked: 40 feet  Assistive device utilized: None Level of assistance: Complete Independence Comments: L Trendelenburg gait with hip instability; L LE shorter than R   [facility] Adult PT Treatment:  DATE: 08/27/23 Therapeutic Exercise: Bent knee rocking Double knee to chest Bridge with yoga block squeeze 2 x 10 Sidelying reverse clams with ball between knees 2 x 10 Prone hip ext with knee bent x 10 bilat Prone glute squeeze with ball between ankles x 10 Seated hip IR with ball squeeze Squat tap 2 x 10 Hip hike/depression on 4'' step x 10 Captain morgans x 10 bilat Side step kicking 10# KB 10' x 2 Standing hip diagonal 3# 2 x 10 Standing HS curl 2 x 10    [facility] Adult PT Treatment:                                                DATE: 08/23/23 Pt seen for aquatic therapy today.  Treatment took place in water 3.5-4.75 ft in depth at the [facility] pool. Temp of water was 91.  Pt entered/exited the pool via stairs independent  with bilat rail. * intro to aquatic therapy principles and properties * unsupported walking forward/ backwards with cues * farmers carry with single yellow hand float, marching forward/backward  * UE on yellow hand floats:  3 way LE kick 2 x 5 *UE on wall: leg swings into hip abdct/ addct crossing midline;  single leg clams x 5 each, then 5 each alternating LEs * straddling noodle with UE on wall: cycling (trial with UE on hand floats-difficulty balancing),suspended hip abdct/addct; cross country ski with UE on wall   Pt requires the buoyancy and hydrostatic pressure of water for support, and to offload joints by  unweighting joint load by at least 50 % in navel deep water and by at least 75-80% in chest to neck deep water.  Viscosity of the water is needed for resistance of strengthening. Water current perturbations provides challenge to standing balance requiring increased core activation.    [facility] Adult PT Treatment:                                                DATE: 08/14/23 Therapeutic Exercise: Supine Bridge with yoga block squeeze x 10 reps x 2 sets Lumbar rocking  Double knee to chest  Sidelying Reverse clam R LE and L LE x 12 reps (L side needs assist to raise against gravity) Prone Hip extension R (with L she compensates with R hip flexion into mat-- not pure anti-gravity motion) Glut squeeze with ball between knees Standing Posterior/lateral pillowcase slide engaging contralateral glut med x 12 reps Hip hike/depression with one LE on 4" step Backwards walking x 10 feet x 3 reps Squats x 10 reps Seated Hip IR x 10 reps x 2 sets with yoga block squeeze Sit<>stand x 5 reps, then stand to squat to chair x 10 reps without UE support   [facility] Adult PT Treatment:                                                DATE: 08/12/2023 Therapeutic Exercise: Figure 4 LTR Supine piriformis stretch + opp leg stretch Prone: Bent knee heel squeezes 10x5" Straight leg hip ext 2x10 Bent knee hip 2x10  Quad stretch w/strap 3x30" Standing: Hip hikes (rolling ball up wall) 3x10 (B) Hip abd + RTB 2x10 Side stepping + RTB crossed at ankles  HS curls + 3#AW 2x10 Hip ext on diagonal + 3#AW 2x10    [facility] Adult PT Treatment:                                                DATE: 08/07/23 Therapeutic Exercise: Supine piriformis stretch 2 x 30 sec Supine figure 4 stretch for glute 2 x 30 sec Prone hip extension --> straight leg 2x10, bent knee 2x10 Reverse clam with ball between knees 2 x 15 Sidelying hip abd with ER 2 x 10 Captain morgans 10 x 3 sec hold bilat Hip hike rolling ball up wall x 10  bilat Side step green TB around knees Hip 3 way on slider x 10 bilat Lateral tap down 4'' step (limited by knee pain) Sitting Hip IR + YTB (ball b/w knees) 2x15 (B) Supine figure 4 LTR   PATIENT EDUCATION:  Education details: Updated HEP  Person educated: Patient Education method: Explanation, Demonstration, Tactile cues, Verbal cues, and Handouts Education comprehension: verbalized understanding, returned demonstration, verbal cues required, tactile cues required, and needs further education  HOME EXERCISE PROGRAM: Access Code: [email or web address] URL: [email or web address] Date: 08/05/2023 Prepared by: Carlynn Herald  Exercises - Prone Gluteal Sets  - 1 x daily - 7 x weekly - 1 sets - 10 reps - 10 sec  hold - Prone Quadriceps Stretch with Strap  - 1 x daily - 7 x weekly - 1 sets - 3 reps - 30 sec  hold - Prone Hip Extension  - 1 x daily - 7 x weekly - 1 sets - 10 reps - 3 sec  hold - Supine Piriformis Stretch with Leg Straight  - 1 x daily - 7 x weekly - 1 sets - 3 reps - 30 sec  hold - Hooklying Isometric Clamshell  - 1 x daily - 7 x weekly - 1 sets - 10 reps - 3 sec  hold - Supine Bridge with Resistance Band  - 1 x daily - 7 x weekly - 1-2 sets - 10 reps - 5-10 sec  hold - Sidelying Hip Abduction  - 1 x daily - 7 x weekly - 3 sets - 10 reps - 3-5 sec  hold - Seated Hip Internal Rotation with Ball and Resistance  - 2 x daily - 7 x weekly - 1 sets - 10 reps - 3 sec  hold - Single Leg Stance with Support  - 2 x daily - 7 x weekly - 1 sets - 3 reps - 30 sec  hold - Standing Hip Hiking  - 1 x daily - 7 x weekly - 3 sets - 10 reps - Sidelying Hip Extension in Abduction  - 1 x daily - 7 x weekly - 3 sets - 10 reps - Sidelying Hip Extension in Abduction with Knee Bent  - 1 x daily - 7 x weekly - 3 sets - 10 reps - Side Stepping with Resistance at Thighs  - 1 x daily - 7 x weekly - 3 sets - 10 reps - Prone Heel Squeeze  - 1 x daily - 7 x weekly - 3 sets - 10 reps - 5 sec  hold  ASSESSMENT:  CLINICAL IMPRESSION: Pt continues with significant  Lt hip weakness, requires rests with hip IR activities. Continued to progress standing exercises to improve standing and walking endurance    Eval: Patient is a 72 y.o. female who was seen today for physical therapy evaluation and treatment for bilat LE weakness and bilat hip pain with symptoms present over the past 6-8 months. 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 She presents today with L Trendelenburg gait pattern with hip instability and pain with standing and walking > 10-15 min. Patient has decreased hip and knee ROM; decreased strength bilat LEs; abnormal gait pattern; pain with functional activities. Patient will benefit from PT to address problems identified.   OBJECTIVE IMPAIRMENTS: Abnormal gait, decreased activity tolerance, decreased balance, decreased mobility, decreased ROM, decreased strength, hypomobility, increased fascial restrictions, improper body mechanics, postural dysfunction, and pain.    GOALS: Goals reviewed with patient? Yes  SHORT TERM GOALS: Target date: 08/28/2023  Independent in initial HEP  Baseline: Goal status: MET  2.  Improve standing alignment and posture  Baseline:  Goal status: IN PROGRESS   LONG TERM GOALS: Target date: 10/09/2023  Increase ROM R hip rotation with patient to demonstrated good I/ER range  Baseline:  Goal status: INITIAL  2.  4+ to 5/5 strength bilat LE's  Baseline:  Goal status: INITIAL  3.  Improve gait stability with less evidence of gait instability  Baseline:  Goal status: INITIAL  4.  Patient reports ability to walk for 20-30 min with minimal to no increase in pain  Baseline:  Goal status: INITIAL  5.  Decrease pain in bilat hips by 50-70% allowing patient to increase activity level  Baseline:  Goal status: INITIAL  6.  Independent in HEP, including aquatic program as indicated  Baseline:   Goal status: INITIAL    7. Improve functional limitation score to 53    Baseline: 40    Goal status: INITIAL   PLAN:  PT FREQUENCY: 2x/week  PT DURATION: 12 weeks  PLANNED INTERVENTIONS: 97164- PT Re-evaluation, 97110-Therapeutic exercises, 97530- Therapeutic activity, [id number]- Neuromuscular re-education, 97535- Self Care, 16109- Manual therapy, [id number]- Gait training, [id number]- Aquatic Therapy, 97014- Electrical stimulation (unattended), 97035- Ultrasound, 09811- Ionotophoresis 4mg /ml Dexamethasone, Patient/Family education, Balance training, Stair training, Taping, Dry Needling, Joint mobilization, Spinal mobilization, Cryotherapy, and Moist heat  PLAN FOR NEXT SESSION: Progress exercises; continue education and home instruction.    Reggy Eye, PT,DPT12/17/2412:23 PM

## 2023-08-28 ENCOUNTER — Ambulatory Visit (HOSPITAL_BASED_OUTPATIENT_CLINIC_OR_DEPARTMENT_OTHER): Payer: Medicare HMO | Admitting: Physical Therapy

## 2023-08-30 ENCOUNTER — Encounter (HOSPITAL_BASED_OUTPATIENT_CLINIC_OR_DEPARTMENT_OTHER): Payer: Self-pay | Admitting: Physical Therapy

## 2023-08-30 ENCOUNTER — Ambulatory Visit (HOSPITAL_BASED_OUTPATIENT_CLINIC_OR_DEPARTMENT_OTHER): Payer: Medicare HMO | Admitting: Physical Therapy

## 2023-08-30 DIAGNOSIS — M6281 Muscle weakness (generalized): Secondary | ICD-10-CM | POA: Diagnosis not present

## 2023-08-30 DIAGNOSIS — M25551 Pain in right hip: Secondary | ICD-10-CM | POA: Diagnosis not present

## 2023-08-30 DIAGNOSIS — R29898 Other symptoms and signs involving the musculoskeletal system: Secondary | ICD-10-CM | POA: Diagnosis not present

## 2023-08-30 DIAGNOSIS — R2689 Other abnormalities of gait and mobility: Secondary | ICD-10-CM | POA: Diagnosis not present

## 2023-08-30 DIAGNOSIS — M25552 Pain in left hip: Secondary | ICD-10-CM | POA: Diagnosis not present

## 2023-08-30 NOTE — Therapy (Signed)
OUTPATIENT PHYSICAL THERAPY LOWER EXTREMITY TREATMENT AND 10th visit note   Patient Name: Teresa Spence MRN: [id number] DOB:[birth year], 72 y.o., female Today's Date: 08/30/2023 Dates of service: 07/17/23 - 08/27/23 END OF SESSION:  PT End of Session - 08/30/23 1357     Visit Number 12    Number of Visits 24    Date for PT Re-Evaluation 10/09/23    Authorization Type [employer] medicare copay $20    Authorization Time Period 16 VISITS APPROVED FOR PT 07/08/2023-09/06/2023    Authorization - Visit Number 12    Authorization - Number of Visits 16    Progress Note Due on Visit 20    PT Start Time 1349    PT Stop Time 1428    PT Time Calculation (min) 39 min    Behavior During Therapy [facility] for tasks assessed/performed               Past Medical History:  Diagnosis Date   Allergy 1975   Environmental   Arthritis 1985   Bilateral carpal tunnel syndrome 01/20/2020   Diabetes mellitus without complication (HCC)    Heart murmur 105w   Hypertension    Past Surgical History:  Procedure Laterality Date   BUNIONECTOMY Right 06/10/2019   HAMMER TOE SURGERY     JOINT REPLACEMENT  2015   MEDIAL PARTIAL KNEE REPLACEMENT     TONSILLECTOMY     Patient Active Problem List   Diagnosis Date Noted   Microalbuminuria 06/12/2023   Pain in both thighs 02/26/2023   Plantar callus 11/23/2022   Left hip pain 11/23/2022   Type 2 diabetes mellitus with diabetic polyneuropathy, without long-term current use of insulin (HCC) 08/28/2021   CKD (chronic kidney disease) stage 2, GFR 60-89 ml/min 08/28/2021   Toe ulcer, left, with necrosis of muscle (HCC) 08/24/2020   Open toe wound, sequela 08/23/2020   Hyperlipidemia LDL goal <70 08/23/2020   Bilateral carpal tunnel syndrome 01/20/2020   DDD (degenerative disc disease), cervical 12/01/2019   Fine motor skill loss 12/01/2019   Tinnitus, left 09/14/2019   ETD (Eustachian tube dysfunction), bilateral 09/14/2019   Neuropathy 06/03/2019    Post-menopausal 06/03/2019   Generalized osteoarthritis of hand 09/11/2018   Colon polyps 05/21/2018   Hyperlipidemia associated with type 2 diabetes mellitus (HCC) 05/06/2018   Seborrheic keratoses 05/06/2018   Dermoid cyst of right ear 05/06/2018   Chest tightness 05/06/2018   Toenail fungus 10/25/2017   Numbness and tingling in both hands 05/08/2017   Primary osteoarthritis involving multiple joints 05/08/2017   Cellulitis of toe of left foot 12/29/2016   Absent pedal pulses 12/29/2016   Essential hypertension 12/27/2016   Uncontrolled type II diabetes with stage 2 chronic kidney disease 12/27/2016    PCP: Lesly Rubenstein L. Caleen Essex PA-C  REFERRING PROVIDER: Lonna Cobb Breeback, PA-C  REFERRING DIAG: Bilat leg weakness; bilat hip pain  THERAPY DIAG:  Pain of both hip joints  Muscle weakness (generalized)  Other abnormalities of gait and mobility  Other symptoms and signs involving the musculoskeletal system  Rationale for Evaluation and Treatment: Rehabilitation  ONSET DATE: 11/09/22  SUBJECTIVE:   SUBJECTIVE STATEMENT:  Pt reports continued pain in Lt hip.  She reports the seated heat in car may be irritating it.   POOL ACCESS:  pt has membership to [employer]  EVAL: Patient reports that she has pain in both hips and weakness in both legs. She reports that she had bunion surgery ~ 3-4 years ago and ended up with a staff  infection in the foot requiring surgery to remove some of infected bone. She has had altered gait since the time of the surgery. She had a blood blister on L foot with debridement by MD 07/10/23. She is treating the wound for infection with oral antibiotics and changing dressing 3x/day with antibiotic ointment. Patient reports that she has stiffness when sitting more than one hour and she finds it difficult to stand upright. She has pain in the front of thighs and in hips with standing or walking. She has pain after walking ~ 15 min. Pain resolves after sitting  rest for a few minutes. She has difficulty going up and down stairs.  PERTINENT HISTORY: Bunion surgeries bilat feet ~ 3 yrs ago - total of 3 surgeries each foot; TKA L 2008; R 2009; HTN; arthritis; carpal tunnel surgery L; nerve release L elbow PAIN:  Are you having pain? Yes: NPRS scale: 3/10 with walking;  Pain location: R hip  Pain description: sharp  Aggravating factors: standing; walking  Relieving factors: sitting TENS   PRECAUTIONS: Other: diabetic ulcer L midfoot with necrosis of muscle tissue  WEIGHT BEARING RESTRICTIONS: No FALLS:  Has patient fallen in last 6 months? Yes. Number of falls 1 - fell forward onto hands and hit L knee sore but no lasting injury  PATIENT GOALS: get stronger in legs; work on being able to get up and down steps; get rid of some of the pain  NEXT MD VISIT: 09/17/23  OBJECTIVE:  Note: Objective measures were completed at Evaluation unless otherwise noted. DIAGNOSTIC FINDINGS: none available   PATIENT SURVEYS:  FOTO 40; goal 7   SENSATION: Numbness bilat feet following Morton's neuroma surgery years ago  MUSCLE LENGTH: Hamstrings: Right 65 deg; Left 60 deg Tight hip flexors - unable to extend past neutral   POSTURE: rounded shoulders, forward head, increased thoracic kyphosis, flexed trunk , and weight shift right  PALPATION: Muscular tightness L posterior hip   LOWER EXTREMITY ROM: Tight limited end range knee extension L > R        Tight R hip rotation  Active ROM Right eval Left eval Right 08/14/23 Left 08/14/23  Hip flexion      Hip extension      Hip abduction      Hip adduction      Hip internal rotation      Hip external rotation      Knee flexion      PRONE  95 90 105 100  Knee extension      Ankle dorsiflexion      Ankle plantarflexion      Ankle inversion      Ankle eversion       (Blank rows = not tested)  LOWER EXTREMITY MMT: MMT Right eval Left eval  Hip flexion 4+ 4-  Hip extension 4- 3+  Hip abduction 4 3   Hip adduction    Hip internal rotation    Hip external rotation    Knee flexion 5 5  Knee extension 5 5  Ankle dorsiflexion    Ankle plantarflexion    Ankle inversion    Ankle eversion     (Blank rows = not tested)  FUNCTIONAL TESTS:  5 times sit to stand: 13.5 sec hands crossed over chest  Leg length difference - L LE ~ 3/4 in shorter than R   GAIT: Distance walked: 40 feet  Assistive device utilized: None Level of assistance: Complete Independence Comments: L Trendelenburg gait with hip  instability; L LE shorter than R   [facility] Adult PT Treatment:                                                DATE: 08/30/23 Pt seen for aquatic therapy today.  Treatment took place in water 3.5-4.75 ft in depth at the [facility] pool. Temp of water was 91.  Pt entered/exited the pool via stairs independent  with bilat rail. PT faces hot tub due to leg length difference * unsupported walking forward/ backwards with cues for vertical trunk and even step length  * farmers carry with bilat rainbow -> yellow -> blue hand float, marching forward/backward (blue too much) * UE on wall:  leg swings into hip abdct/ addct crossing midline 2 x 10 each LE; into hip flexion / ext 2 x 10 each LE;  single leg clams 2 x 10 each * box step L and R * SLS with hands out of water - x 20s each  * SLS with noodle press (thin square noodle) * straddling noodle with UE on wall: cycling (trial with UE on hand floats-difficulty balancing),suspended hip abdct/addct; cross country ski with UE on wall   Pt requires the buoyancy and hydrostatic pressure of water for support, and to offload joints by unweighting joint load by at least 50 % in navel deep water and by at least 75-80% in chest to neck deep water.  Viscosity of the water is needed for resistance of strengthening. Water current perturbations provides challenge to standing balance requiring increased core activation.  [facility] Adult PT Treatment:                                                 DATE: 08/27/23 Therapeutic Exercise: Bent knee rocking Double knee to chest Bridge with yoga block squeeze 2 x 10 Sidelying reverse clams with ball between knees 2 x 10 Prone hip ext with knee bent x 10 bilat Prone glute squeeze with ball between ankles x 10 Seated hip IR with ball squeeze Squat tap 2 x 10 Hip hike/depression on 4'' step x 10 Captain morgans x 10 bilat Side step kicking 10# KB 10' x 2 Standing hip diagonal 3# 2 x 10 Standing HS curl 2 x 10    [facility] Adult PT Treatment:                                                DATE: 08/23/23 Pt seen for aquatic therapy today.  Treatment took place in water 3.5-4.75 ft in depth at the [facility] pool. Temp of water was 91.  Pt entered/exited the pool via stairs independent  with bilat rail. * intro to aquatic therapy principles and properties * unsupported walking forward/ backwards with cues * farmers carry with single yellow hand float, marching forward/backward  * UE on yellow hand floats:  3 way LE kick 2 x 5 *UE on wall: leg swings into hip abdct/ addct crossing midline;  single leg clams x 5 each, then 5 each alternating LEs * straddling noodle with UE on wall: cycling; suspended  hip abdct/addct; cross country ski  Pt requires the buoyancy and hydrostatic pressure of water for support, and to offload joints by unweighting joint load by at least 50 % in navel deep water and by at least 75-80% in chest to neck deep water.  Viscosity of the water is needed for resistance of strengthening. Water current perturbations provides challenge to standing balance requiring increased core activation.    [facility] Adult PT Treatment:                                                DATE: 08/14/23 Therapeutic Exercise: Supine Bridge with yoga block squeeze x 10 reps x 2 sets Lumbar rocking  Double knee to chest  Sidelying Reverse clam R LE and L LE x 12 reps (L side needs assist to raise against  gravity) Prone Hip extension R (with L she compensates with R hip flexion into mat-- not pure anti-gravity motion) Glut squeeze with ball between knees Standing Posterior/lateral pillowcase slide engaging contralateral glut med x 12 reps Hip hike/depression with one LE on 4" step Backwards walking x 10 feet x 3 reps Squats x 10 reps Seated Hip IR x 10 reps x 2 sets with yoga block squeeze Sit<>stand x 5 reps, then stand to squat to chair x 10 reps without UE support   [facility] Adult PT Treatment:                                                DATE: 08/12/2023 Therapeutic Exercise: Figure 4 LTR Supine piriformis stretch + opp leg stretch Prone: Bent knee heel squeezes 10x5" Straight leg hip ext 2x10 Bent knee hip 2x10 Quad stretch w/strap 3x30" Standing: Hip hikes (rolling ball up wall) 3x10 (B) Hip abd + RTB 2x10 Side stepping + RTB crossed at ankles  HS curls + 3#AW 2x10 Hip ext on diagonal + 3#AW 2x10    [facility] Adult PT Treatment:                                                DATE: 08/07/23 Therapeutic Exercise: Supine piriformis stretch 2 x 30 sec Supine figure 4 stretch for glute 2 x 30 sec Prone hip extension --> straight leg 2x10, bent knee 2x10 Reverse clam with ball between knees 2 x 15 Sidelying hip abd with ER 2 x 10 Captain morgans 10 x 3 sec hold bilat Hip hike rolling ball up wall x 10 bilat Side step green TB around knees Hip 3 way on slider x 10 bilat Lateral tap down 4'' step (limited by knee pain) Sitting Hip IR + YTB (ball b/w knees) 2x15 (B) Supine figure 4 LTR   PATIENT EDUCATION:  Education details: aquatic progressions/ modifications  Person educated: Patient Education method: Programmer, multimedia, Facilities manager, Actor cues, Verbal cues, and Handouts Education comprehension: verbalized understanding, returned demonstration, verbal cues required, tactile cues required, and needs further education  HOME EXERCISE PROGRAM: Access Code: [email or web address] URL:  [email or web address] Date: 08/05/2023 Prepared by: Carlynn Herald  Exercises - Prone Gluteal Sets  - 1 x daily - 7 x weekly - 1 sets -  10 reps - 10 sec  hold - Prone Quadriceps Stretch with Strap  - 1 x daily - 7 x weekly - 1 sets - 3 reps - 30 sec  hold - Prone Hip Extension  - 1 x daily - 7 x weekly - 1 sets - 10 reps - 3 sec  hold - Supine Piriformis Stretch with Leg Straight  - 1 x daily - 7 x weekly - 1 sets - 3 reps - 30 sec  hold - Hooklying Isometric Clamshell  - 1 x daily - 7 x weekly - 1 sets - 10 reps - 3 sec  hold - Supine Bridge with Resistance Band  - 1 x daily - 7 x weekly - 1-2 sets - 10 reps - 5-10 sec  hold - Sidelying Hip Abduction  - 1 x daily - 7 x weekly - 3 sets - 10 reps - 3-5 sec  hold - Seated Hip Internal Rotation with Ball and Resistance  - 2 x daily - 7 x weekly - 1 sets - 10 reps - 3 sec  hold - Single Leg Stance with Support  - 2 x daily - 7 x weekly - 1 sets - 3 reps - 30 sec  hold - Standing Hip Hiking  - 1 x daily - 7 x weekly - 3 sets - 10 reps - Sidelying Hip Extension in Abduction  - 1 x daily - 7 x weekly - 3 sets - 10 reps - Sidelying Hip Extension in Abduction with Knee Bent  - 1 x daily - 7 x weekly - 3 sets - 10 reps - Side Stepping with Resistance at Thighs  - 1 x daily - 7 x weekly - 3 sets - 10 reps - Prone Heel Squeeze  - 1 x daily - 7 x weekly - 3 sets - 10 reps - 5 sec hold  ASSESSMENT:  CLINICAL IMPRESSION: Pt tolerated session well, without any production of symptoms in hip.  Plan to issue laminated aquatic HEP at next session and finalize in 4th session, if tolerated well.  Pt is progressing towards goals.     Eval: Patient is a 72 y.o. female who was seen today for physical therapy evaluation and treatment for bilat LE weakness and bilat hip pain with symptoms present over the past 6-8 months. 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 She presents today with L Trendelenburg gait  pattern with hip instability and pain with standing and walking > 10-15 min. Patient has decreased hip and knee ROM; decreased strength bilat LEs; abnormal gait pattern; pain with functional activities. Patient will benefit from PT to address problems identified.   OBJECTIVE IMPAIRMENTS: Abnormal gait, decreased activity tolerance, decreased balance, decreased mobility, decreased ROM, decreased strength, hypomobility, increased fascial restrictions, improper body mechanics, postural dysfunction, and pain.    GOALS: Goals reviewed with patient? Yes  SHORT TERM GOALS: Target date: 08/28/2023  Independent in initial HEP  Baseline: Goal status: MET  2.  Improve standing alignment and posture  Baseline:  Goal status: IN PROGRESS   LONG TERM GOALS: Target date: 10/09/2023  Increase ROM R hip rotation with patient to demonstrated good I/ER range  Baseline:  Goal status: INITIAL  2.  4+ to 5/5 strength bilat LE's  Baseline:  Goal status: INITIAL  3.  Improve gait stability with less evidence of gait instability  Baseline:  Goal status: INITIAL  4.  Patient reports ability to walk  for 20-30 min with minimal to no increase in pain  Baseline:  Goal status: INITIAL  5.  Decrease pain in bilat hips by 50-70% allowing patient to increase activity level  Baseline:  Goal status: INITIAL  6.  Independent in HEP, including aquatic program as indicated  Baseline:  Goal status: INITIAL    7. Improve functional limitation score to 53    Baseline: 40    Goal status: INITIAL   PLAN:  PT FREQUENCY: 2x/week  PT DURATION: 12 weeks  PLANNED INTERVENTIONS: 97164- PT Re-evaluation, 97110-Therapeutic exercises, 97530- Therapeutic activity, [id number]- Neuromuscular re-education, 97535- Self Care, [id number]- Manual therapy, [id number]- Gait training, [phone]- Aquatic Therapy, 97014- Electrical stimulation (unattended), 97035- Ultrasound, 28413- Ionotophoresis 4mg /ml Dexamethasone, Patient/Family education,  Balance training, Stair training, Taping, Dry Needling, Joint mobilization, Spinal mobilization, Cryotherapy, and Moist heat  PLAN FOR NEXT SESSION: Progress exercises; continue education and home instruction.

## 2023-09-02 DIAGNOSIS — R69 Illness, unspecified: Secondary | ICD-10-CM | POA: Diagnosis not present

## 2023-09-06 DIAGNOSIS — M25872 Other specified joint disorders, left ankle and foot: Secondary | ICD-10-CM | POA: Insufficient documentation

## 2023-09-09 DIAGNOSIS — R69 Illness, unspecified: Secondary | ICD-10-CM | POA: Diagnosis not present

## 2023-09-10 ENCOUNTER — Ambulatory Visit: Payer: Medicare HMO | Admitting: Physical Therapy

## 2023-09-13 ENCOUNTER — Ambulatory Visit (HOSPITAL_BASED_OUTPATIENT_CLINIC_OR_DEPARTMENT_OTHER): Payer: Self-pay | Admitting: Physical Therapy

## 2023-09-16 NOTE — Therapy (Signed)
 OUTPATIENT PHYSICAL THERAPY LOWER EXTREMITY TREATMENT    Patient Name: Haely Leyland MRN: [id number] DOB:1950-10-27, 73 y.o., female Today's Date: 09/17/2023 Dates of service: 07/17/23 - 08/27/23 END OF SESSION:  PT End of Session - 09/17/23 0939     Visit Number 13    Number of Visits 24    Date for PT Re-Evaluation 10/09/23    Authorization Type [employer] medicare copay $20    Authorization Time Period 16 VISITS APPROVED FOR PT 07/08/2023-09/06/2023    Authorization - Visit Number 13    Authorization - Number of Visits 16    Progress Note Due on Visit 20    PT Start Time 0938    PT Stop Time 1016    PT Time Calculation (min) 38 min    Activity Tolerance Patient tolerated treatment well    Behavior During Therapy [facility] for tasks assessed/performed                Past Medical History:  Diagnosis Date   Allergy 1975   Environmental   Arthritis 1985   Bilateral carpal tunnel syndrome 01/20/2020   Diabetes mellitus without complication (HCC)    Heart murmur 105w   Hypertension    Past Surgical History:  Procedure Laterality Date   BUNIONECTOMY Right 06/10/2019   HAMMER TOE SURGERY     JOINT REPLACEMENT  2015   MEDIAL PARTIAL KNEE REPLACEMENT     TONSILLECTOMY     Patient Active Problem List   Diagnosis Date Noted   Microalbuminuria 06/12/2023   Pain in both thighs 02/26/2023   Plantar callus 11/23/2022   Left hip pain 11/23/2022   Type 2 diabetes mellitus with diabetic polyneuropathy, without long-term current use of insulin (HCC) 08/28/2021   CKD (chronic kidney disease) stage 2, GFR 60-89 ml/min 08/28/2021   Toe ulcer, left, with necrosis of muscle (HCC) 08/24/2020   Open toe wound, sequela 08/23/2020   Hyperlipidemia LDL goal <70 08/23/2020   Bilateral carpal tunnel syndrome 01/20/2020   DDD (degenerative disc disease), cervical 12/01/2019   Fine motor skill loss 12/01/2019   Tinnitus, left 09/14/2019   ETD (Eustachian tube dysfunction), bilateral 09/14/2019    Neuropathy 06/03/2019   Post-menopausal 06/03/2019   Generalized osteoarthritis of hand 09/11/2018   Colon polyps 05/21/2018   Hyperlipidemia associated with type 2 diabetes mellitus (HCC) 05/06/2018   Seborrheic keratoses 05/06/2018   Dermoid cyst of right ear 05/06/2018   Chest tightness 05/06/2018   Class 1 obesity due to excess calories with serious comorbidity and body mass index (BMI) of 30.0 to 30.9 in adult 11/05/2017   Toenail fungus 10/25/2017   Numbness and tingling in both hands 05/08/2017   Primary osteoarthritis involving multiple joints 05/08/2017   Cellulitis of toe of left foot 12/29/2016   Absent pedal pulses 12/29/2016   Essential hypertension 12/27/2016   Uncontrolled type II diabetes with stage 2 chronic kidney disease 12/27/2016    PCP: Vermell L. Antoniette PA-C  REFERRING PROVIDER: Vermell BECKER Breeback, PA-C  REFERRING DIAG: Bilat leg weakness; bilat hip pain  THERAPY DIAG:  Pain of both hip joints  Muscle weakness (generalized)  Other abnormalities of gait and mobility  Other symptoms and signs involving the musculoskeletal system  Rationale for Evaluation and Treatment: Rehabilitation  ONSET DATE: 11/09/22  SUBJECTIVE:   SUBJECTIVE STATEMENT:  I tried my exercise pedaler yesterday x 5 min.   POOL ACCESS:  pt has membership to [employer]  EVAL: Patient reports that she has pain in both hips and  weakness in both legs. She reports that she had bunion surgery ~ 3-4 years ago and ended up with a staff infection in the foot requiring surgery to remove some of infected bone. She has had altered gait since the time of the surgery. She had a blood blister on L foot with debridement by MD 07/10/23. She is treating the wound for infection with oral antibiotics and changing dressing 3x/day with antibiotic ointment. Patient reports that she has stiffness when sitting more than one hour and she finds it difficult to stand upright. She has pain in the front of  thighs and in hips with standing or walking. She has pain after walking ~ 15 min. Pain resolves after sitting rest for a few minutes. She has difficulty going up and down stairs.  PERTINENT HISTORY: Bunion surgeries bilat feet ~ 3 yrs ago - total of 3 surgeries each foot; TKA L 2008; R 2009; HTN; arthritis; carpal tunnel surgery L; nerve release L elbow PAIN:  Are you having pain? Yes: NPRS scale: 3/10 with walking;  Pain location: R hip  Pain description: sharp  Aggravating factors: standing; walking  Relieving factors: sitting TENS   PRECAUTIONS: Other: diabetic ulcer L midfoot with necrosis of muscle tissue  WEIGHT BEARING RESTRICTIONS: No FALLS:  Has patient fallen in last 6 months? Yes. Number of falls 1 - fell forward onto hands and hit L knee sore but no lasting injury  PATIENT GOALS: get stronger in legs; work on being able to get up and down steps; get rid of some of the pain  NEXT MD VISIT: 09/17/23  OBJECTIVE:  Note: Objective measures were completed at Evaluation unless otherwise noted. DIAGNOSTIC FINDINGS: none available   PATIENT SURVEYS:  FOTO 40; goal 45   SENSATION: Numbness bilat feet following Morton's neuroma surgery years ago  MUSCLE LENGTH: Hamstrings: Right 65 deg; Left 60 deg Tight hip flexors - unable to extend past neutral   POSTURE: rounded shoulders, forward head, increased thoracic kyphosis, flexed trunk , and weight shift right  PALPATION: Muscular tightness L posterior hip   LOWER EXTREMITY ROM: Tight limited end range knee extension L > R        Tight R hip rotation  Active ROM Right eval Left eval Right 08/14/23 Left 08/14/23  Hip flexion      Hip extension      Hip abduction      Hip adduction      Hip internal rotation      Hip external rotation      Knee flexion      PRONE  95 90 105 100  Knee extension      Ankle dorsiflexion      Ankle plantarflexion      Ankle inversion      Ankle eversion       (Blank rows = not  tested)  LOWER EXTREMITY MMT: MMT Right eval Left eval  Hip flexion 4+ 4-  Hip extension 4- 3+  Hip abduction 4 3  Hip adduction    Hip internal rotation    Hip external rotation    Knee flexion 5 5  Knee extension 5 5  Ankle dorsiflexion    Ankle plantarflexion    Ankle inversion    Ankle eversion     (Blank rows = not tested)  FUNCTIONAL TESTS:  5 times sit to stand: 13.5 sec hands crossed over chest  Leg length difference - L LE ~ 3/4 in shorter than R  GAIT: Distance walked: 40 feet  Assistive device utilized: None Level of assistance: Complete Independence Comments: L Trendelenburg gait with hip instability; L LE shorter than R    [facility] Adult PT Treatment:                                                DATE: 09/17/23 Therapeutic Exercise: Bent knee rocking Double knee to chest Bridge with yoga block squeeze 2 x 10 Sidelying reverse clams with ball between knees 2 x 10 Seated hip IR with ball between knees 2x10 Prone hip ext with knee bent x 10 bilat Prone glute squeeze with ball between ankles x 10 Squat tap 2 x 10 Hip hike/depression on 4'' step x 10 Captain morgans x 10 bilat Side step kicking 10# KB 10' x 2 Standing hip diagonal 3# 2 x 10 Standing HS curl 2 x 10  [facility] Adult PT Treatment:                                                DATE: 08/30/23 Pt seen for aquatic therapy today.  Treatment took place in water 3.5-4.75 ft in depth at the [facility] pool. Temp of water was 91.  Pt entered/exited the pool via stairs independent  with bilat rail. PT faces hot tub due to leg length difference * unsupported walking forward/ backwards with cues for vertical trunk and even step length  * farmers carry with bilat rainbow -> yellow -> blue hand float, marching forward/backward (blue too much) * UE on wall:  leg swings into hip abdct/ addct crossing midline 2 x 10 each LE; into hip flexion / ext 2 x 10 each LE;  single leg clams 2 x 10 each * box step L  and R * SLS with hands out of water - x 20s each  * SLS with noodle press (thin square noodle) * straddling noodle with UE on wall: cycling (trial with UE on hand floats-difficulty balancing),suspended hip abdct/addct; cross country ski with UE on wall   Pt requires the buoyancy and hydrostatic pressure of water for support, and to offload joints by unweighting joint load by at least 50 % in navel deep water and by at least 75-80% in chest to neck deep water.  Viscosity of the water is needed for resistance of strengthening. Water current perturbations provides challenge to standing balance requiring increased core activation.  [facility] Adult PT Treatment:                                                DATE: 08/27/23 Therapeutic Exercise: Bent knee rocking Double knee to chest Bridge with yoga block squeeze 2 x 10 Sidelying reverse clams with ball between knees 2 x 10 Prone hip ext with knee bent 2x 10 bilat Seated hip IR with ball squeeze Squat tap 1 x 10 (using back of mat for assist), then cued to step out from wall for functional squat and could only do 5 Hip hike/depression on 4'' step x 10 Captain morgans x 10 bilat Standing hip diagonal 3# 3 x 10 Standing  HS curl 3# 2 x 10    [facility] Adult PT Treatment:                                                DATE: 08/23/23 Pt seen for aquatic therapy today.  Treatment took place in water 3.5-4.75 ft in depth at the [facility] pool. Temp of water was 91.  Pt entered/exited the pool via stairs independent  with bilat rail. * intro to aquatic therapy principles and properties * unsupported walking forward/ backwards with cues * farmers carry with single yellow hand float, marching forward/backward  * UE on yellow hand floats:  3 way LE kick 2 x 5 *UE on wall: leg swings into hip abdct/ addct crossing midline;  single leg clams x 5 each, then 5 each alternating LEs * straddling noodle with UE on wall: cycling; suspended hip abdct/addct;  cross country ski  Pt requires the buoyancy and hydrostatic pressure of water for support, and to offload joints by unweighting joint load by at least 50 % in navel deep water and by at least 75-80% in chest to neck deep water.  Viscosity of the water is needed for resistance of strengthening. Water current perturbations provides challenge to standing balance requiring increased core activation.   PATIENT EDUCATION:  Education details: aquatic progressions/ modifications  Person educated: Patient Education method: Programmer, Multimedia, Facilities Manager, Actor cues, Verbal cues, and Handouts Education comprehension: verbalized understanding, returned demonstration, verbal cues required, tactile cues required, and needs further education  HOME EXERCISE PROGRAM: Access Code: [email or web address] URL: [email or web address] Date: 08/05/2023 Prepared by: Lamarr Price  Exercises - Prone Gluteal Sets  - 1 x daily - 7 x weekly - 1 sets - 10 reps - 10 sec  hold - Prone Quadriceps Stretch with Strap  - 1 x daily - 7 x weekly - 1 sets - 3 reps - 30 sec  hold - Prone Hip Extension  - 1 x daily - 7 x weekly - 1 sets - 10 reps - 3 sec  hold - Supine Piriformis Stretch with Leg Straight  - 1 x daily - 7 x weekly - 1 sets - 3 reps - 30 sec  hold - Hooklying Isometric Clamshell  - 1 x daily - 7 x weekly - 1 sets - 10 reps - 3 sec  hold - Supine Bridge with Resistance Band  - 1 x daily - 7 x weekly - 1-2 sets - 10 reps - 5-10 sec  hold - Sidelying Hip Abduction  - 1 x daily - 7 x weekly - 3 sets - 10 reps - 3-5 sec  hold - Seated Hip Internal Rotation with Ball and Resistance  - 2 x daily - 7 x weekly - 1 sets - 10 reps - 3 sec  hold - Single Leg Stance with Support  - 2 x daily - 7 x weekly - 1 sets - 3 reps - 30 sec  hold - Standing Hip Hiking  - 1 x daily - 7 x weekly - 3 sets - 10 reps - Sidelying Hip Extension in Abduction  - 1 x daily - 7 x weekly - 3 sets - 10 reps - Sidelying Hip Extension in Abduction  with Knee Bent  - 1 x daily - 7 x weekly - 3 sets - 10 reps - Side Stepping with Resistance at Thighs  -  1 x daily - 7 x weekly - 3 sets - 10 reps - Prone Heel Squeeze  - 1 x daily - 7 x weekly - 3 sets - 10 reps - 5 sec hold  ASSESSMENT:  CLINICAL IMPRESSION: Lenna is progressing well with strengthening, but she continues to report balance problems. Added in some hip stretching due to complaints of hip tightness. SL glute med activities are still challenging. We also modified sit to stand to activate quads more which made them more difficult for her. Keshara continues to demonstrate potential for improvement and would benefit from continued skilled therapy to address impairments.      Eval: Patient is a 73 y.o. female who was seen today for physical therapy evaluation and treatment for bilat LE weakness and bilat hip pain with symptoms present over the past 6-8 months. 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 She presents today with L Trendelenburg gait pattern with hip instability and pain with standing and walking > 10-15 min. Patient has decreased hip and knee ROM; decreased strength bilat LEs; abnormal gait pattern; pain with functional activities. Patient will benefit from PT to address problems identified.   OBJECTIVE IMPAIRMENTS: Abnormal gait, decreased activity tolerance, decreased balance, decreased mobility, decreased ROM, decreased strength, hypomobility, increased fascial restrictions, improper body mechanics, postural dysfunction, and pain.    GOALS: Goals reviewed with patient? Yes  SHORT TERM GOALS: Target date: 08/28/2023  Independent in initial HEP  Baseline: Goal status: MET  2.  Improve standing alignment and posture  Baseline:  Goal status: IN PROGRESS   LONG TERM GOALS: Target date: 10/09/2023  Increase ROM R hip rotation with patient to demonstrated good I/ER range  Baseline:  Goal status: INITIAL  2.  4+ to 5/5  strength bilat LE's  Baseline:  Goal status: INITIAL  3.  Improve gait stability with less evidence of gait instability  Baseline:  Goal status: INITIAL  4.  Patient reports ability to walk for 20-30 min with minimal to no increase in pain  Baseline:  Goal status: INITIAL  5.  Decrease pain in bilat hips by 50-70% allowing patient to increase activity level  Baseline:  Goal status: INITIAL  6.  Independent in HEP, including aquatic program as indicated  Baseline:  Goal status: INITIAL    7. Improve functional limitation score to 53    Baseline: 40    Goal status: INITIAL   PLAN:  PT FREQUENCY: 2x/week  PT DURATION: 12 weeks  PLANNED INTERVENTIONS: 97164- PT Re-evaluation, 97110-Therapeutic exercises, 97530- Therapeutic activity, 97112- Neuromuscular re-education, 97535- Self Care, 02859- Manual therapy, [id number]- Gait training, [phone]- Aquatic Therapy, 97014- Electrical stimulation (unattended), 97035- Ultrasound, 02966- Ionotophoresis 4mg /ml Dexamethasone , Patient/Family education, Balance training, Stair training, Taping, Dry Needling, Joint mobilization, Spinal mobilization, Cryotherapy, and Moist heat  PLAN FOR NEXT SESSION: Progress exercises; continue education and home instruction.

## 2023-09-17 ENCOUNTER — Encounter: Payer: Self-pay | Admitting: Physical Therapy

## 2023-09-17 ENCOUNTER — Encounter: Payer: Self-pay | Admitting: Physician Assistant

## 2023-09-17 ENCOUNTER — Ambulatory Visit: Payer: Medicare HMO | Attending: Physician Assistant | Admitting: Physical Therapy

## 2023-09-17 ENCOUNTER — Ambulatory Visit (INDEPENDENT_AMBULATORY_CARE_PROVIDER_SITE_OTHER): Payer: Medicare HMO | Admitting: Physician Assistant

## 2023-09-17 VITALS — BP 130/68 | HR 88 | Resp 12 | Ht 65.0 in | Wt 184.0 lb

## 2023-09-17 DIAGNOSIS — M25551 Pain in right hip: Secondary | ICD-10-CM | POA: Diagnosis not present

## 2023-09-17 DIAGNOSIS — M6281 Muscle weakness (generalized): Secondary | ICD-10-CM | POA: Insufficient documentation

## 2023-09-17 DIAGNOSIS — R29898 Other symptoms and signs involving the musculoskeletal system: Secondary | ICD-10-CM | POA: Insufficient documentation

## 2023-09-17 DIAGNOSIS — M25552 Pain in left hip: Secondary | ICD-10-CM | POA: Diagnosis not present

## 2023-09-17 DIAGNOSIS — E6609 Other obesity due to excess calories: Secondary | ICD-10-CM | POA: Diagnosis not present

## 2023-09-17 DIAGNOSIS — E66811 Other obesity due to excess calories: Secondary | ICD-10-CM

## 2023-09-17 DIAGNOSIS — R2689 Other abnormalities of gait and mobility: Secondary | ICD-10-CM | POA: Insufficient documentation

## 2023-09-17 DIAGNOSIS — E1142 Type 2 diabetes mellitus with diabetic polyneuropathy: Secondary | ICD-10-CM | POA: Diagnosis not present

## 2023-09-17 DIAGNOSIS — Z7984 Long term (current) use of oral hypoglycemic drugs: Secondary | ICD-10-CM | POA: Diagnosis not present

## 2023-09-17 DIAGNOSIS — E785 Hyperlipidemia, unspecified: Secondary | ICD-10-CM | POA: Diagnosis not present

## 2023-09-17 DIAGNOSIS — I10 Essential (primary) hypertension: Secondary | ICD-10-CM

## 2023-09-17 LAB — POCT GLYCOSYLATED HEMOGLOBIN (HGB A1C): Hemoglobin A1C: 7.5 % — AB (ref 4.0–5.6)

## 2023-09-17 MED ORDER — ROSUVASTATIN CALCIUM 10 MG PO TABS
10.0000 mg | ORAL_TABLET | Freq: Every day | ORAL | 1 refills | Status: DC
Start: 2023-09-17 — End: 2024-01-27

## 2023-09-17 MED ORDER — OZEMPIC (0.25 OR 0.5 MG/DOSE) 2 MG/3ML ~~LOC~~ SOPN
0.5000 mg | PEN_INJECTOR | SUBCUTANEOUS | 0 refills | Status: DC
Start: 2023-09-17 — End: 2024-01-27

## 2023-09-17 NOTE — Progress Notes (Signed)
 Established Patient Office Visit  Subjective   Patient ID: Teresa Spence, female    DOB: 1950-11-25  Age: 73 y.o. MRN: [id number]  Chief Complaint  Patient presents with   Medical Management of Chronic Issues    Last A1c 6.7    HPI Pt is a 73 yo female with T2DM, diabetic neuropathy, HTN, HLD, CKD who presents to the clinic for follow up.   She is not checking her sugars regularly. She does not tolerate placement of libre on backs of arms. She is not taking ozempic  due to end of year cost. She plans on restarting it in the new year. She has left foot surgery on Jan 17th. She denies any CP, palpitations, headaches or vision changes. She desires to be active. She is in the pool and physical therapy often. Once she has her surgery she wants to start walking and has a trip planned for [place] in April 2025. She had diabetic eye exam recently with a great report. She has stopped lipitor due to muscle aches.   .. 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 12/27/2016   Cellulitis of toe of left foot 12/29/2016   Absent pedal pulses 12/29/2016   Numbness and tingling in both hands 05/08/2017   Primary osteoarthritis involving multiple joints 05/08/2017   Toenail fungus 10/25/2017   Class 1 obesity due to excess calories with serious comorbidity and body mass index (BMI) of 30.0 to 30.9 in adult 11/05/2017   Hyperlipidemia associated with type 2 diabetes mellitus (HCC) 05/06/2018   Seborrheic keratoses 05/06/2018   Dermoid cyst of right ear 05/06/2018   Chest tightness 05/06/2018   Colon polyps 05/21/2018   Generalized osteoarthritis of hand 09/11/2018   Neuropathy 06/03/2019   Post-menopausal 06/03/2019   Tinnitus, left 09/14/2019   ETD (Eustachian tube dysfunction), bilateral 09/14/2019   DDD (degenerative disc disease), cervical 12/01/2019   Fine motor skill loss 12/01/2019    Bilateral carpal tunnel syndrome 01/20/2020   Open toe wound, sequela 08/23/2020   Hyperlipidemia LDL goal <70 08/23/2020   Toe ulcer, left, with necrosis of muscle (HCC) 08/24/2020   Type 2 diabetes mellitus with diabetic polyneuropathy, without long-term current use of insulin (HCC) 08/28/2021   CKD (chronic kidney disease) stage 2, GFR 60-89 ml/min 08/28/2021   Plantar callus 11/23/2022   Left hip pain 11/23/2022   Pain in both thighs 02/26/2023   Microalbuminuria 06/12/2023   Resolved Ambulatory Problems    Diagnosis Date Noted   Palpitations 12/27/2016   Blue toes 12/29/2016   Choking 05/08/2017   Past Medical History:  Diagnosis Date   Allergy 1975   Arthritis 1985   Diabetes mellitus without complication (HCC)    Heart murmur 105w   Hypertension        Review of Systems  All other systems reviewed and are negative.     Objective:     BP 130/68   Pulse 88   Resp 12   Ht 5' 5 (1.651 m)   Wt 184 lb (83.5 kg)   SpO2 99%   BMI 30.62 kg/m  BP Readings from Last 3 Encounters:  09/17/23 130/68  06/12/23 130/78  02/26/23 137/67   Wt Readings from Last 3 Encounters:  09/17/23 184 lb (83.5 kg)  06/12/23 183 lb (83 kg)  02/26/23 188 lb (85.3 kg)   .SABRA Results for orders placed or performed in visit  on 09/17/23  POCT HgB A1C   Collection Time: 09/17/23  9:03 AM  Result Value Ref Range   Hemoglobin A1C 7.5 (A) 4.0 - 5.6 %   HbA1c POC (<> result, manual entry)     HbA1c, POC (prediabetic range)     HbA1c, POC (controlled diabetic range)         Physical Exam Constitutional:      Appearance: Normal appearance. She is obese.  HENT:     Head: Normocephalic.  Cardiovascular:     Rate and Rhythm: Normal rate and regular rhythm.  Pulmonary:     Effort: Pulmonary effort is normal.     Breath sounds: Normal breath sounds.  Musculoskeletal:     Right lower leg: No edema.     Left lower leg: No edema.  Neurological:     General: No focal deficit  present.     Mental Status: She is alert and oriented to person, place, and time.  Psychiatric:        Mood and Affect: Mood normal.        The 10-year ASCVD risk score (Arnett DK, et al., 2019) is: 31.3%    Assessment & Plan:  SABRASABRAAssata was seen today for medical management of chronic issues.  Diagnoses and all orders for this visit:  Type 2 diabetes mellitus with diabetic polyneuropathy, without long-term current use of insulin (HCC) -     POCT HgB A1C -     rosuvastatin  (CRESTOR ) 10 MG tablet; Take 1 tablet (10 mg total) by mouth daily. -     Semaglutide ,0.25 or 0.5MG /DOS, (OZEMPIC , 0.25 OR 0.5 MG/DOSE,) 2 MG/3ML SOPN; Inject 0.5 mg into the skin once a week. -     CMP14+EGFR  Essential hypertension -     CMP14+EGFR  Hyperlipidemia LDL goal <70 -     rosuvastatin  (CRESTOR ) 10 MG tablet; Take 1 tablet (10 mg total) by mouth daily. -     Semaglutide ,0.25 or 0.5MG /DOS, (OZEMPIC , 0.25 OR 0.5 MG/DOSE,) 2 MG/3ML SOPN; Inject 0.5 mg into the skin once a week.  Class 1 obesity due to excess calories with serious comorbidity and body mass index (BMI) of 30.0 to 30.9 in adult   A1C not to goal Not using libre for monitoring-gave her picture of other sites to try and see if more tolerated Admits to not taking ozempic  for the last 2 months due to end of year cost Continues to take metformin  Start back on all medication after foot surgery on the 17th BP to goal after 2nd recheck stay on ACE/hydrochlorothiazide  Discussed microalbuminuria-declines start of new medication right now such as SGLT-2 Trial of crestor  every other day to replace lipitor Pt aware of her very high CV risk and need for statin Cmp ordered  After foot surgery start walking regularly   Return in about 3 months (around 12/16/2023) for DM follow up.    Satcha Storlie, PA-C

## 2023-09-17 NOTE — Patient Instructions (Signed)
 Start crestor every other day for next month and let me know about symptoms if tolerate go to daily   Restart ozempic after foot surgery

## 2023-09-18 LAB — CMP14+EGFR
ALT: 17 [IU]/L (ref 0–32)
AST: 17 [IU]/L (ref 0–40)
Albumin: 4.3 g/dL (ref 3.8–4.8)
Alkaline Phosphatase: 69 [IU]/L (ref 44–121)
BUN/Creatinine Ratio: 27 (ref 12–28)
BUN: 25 mg/dL (ref 8–27)
Bilirubin Total: 0.6 mg/dL (ref 0.0–1.2)
CO2: 20 mmol/L (ref 20–29)
Calcium: 9.9 mg/dL (ref 8.7–10.3)
Chloride: 101 mmol/L (ref 96–106)
Creatinine, Ser: 0.94 mg/dL (ref 0.57–1.00)
Globulin, Total: 2.4 g/dL (ref 1.5–4.5)
Glucose: 130 mg/dL — ABNORMAL HIGH (ref 70–99)
Potassium: 4.5 mmol/L (ref 3.5–5.2)
Sodium: 139 mmol/L (ref 134–144)
Total Protein: 6.7 g/dL (ref 6.0–8.5)
eGFR: 64 mL/min/{1.73_m2} (ref >59)

## 2023-09-18 NOTE — Progress Notes (Signed)
 Kidney and liver look good.

## 2023-09-19 ENCOUNTER — Ambulatory Visit (HOSPITAL_BASED_OUTPATIENT_CLINIC_OR_DEPARTMENT_OTHER): Payer: Self-pay | Admitting: Physical Therapy

## 2023-09-23 NOTE — Therapy (Signed)
 OUTPATIENT PHYSICAL THERAPY LOWER EXTREMITY TREATMENT    Patient Name: Mollye Guinta MRN: [id number] DOB:02-28-1951, 73 y.o., female Today's Date: 09/24/2023 Dates of service: 07/17/23 - 08/27/23 END OF SESSION:  PT End of Session - 09/24/23 1103     Visit Number 14    Number of Visits 24    Date for PT Re-Evaluation 10/09/23    Authorization Type [employer] medicare copay $20    Authorization Time Period 16 VISITS APPROVED FOR PT 07/08/2023-09/06/2023    Authorization - Visit Number 14    Authorization - Number of Visits 16    Progress Note Due on Visit 20    PT Start Time 1103    PT Stop Time 1145    PT Time Calculation (min) 42 min    Activity Tolerance Patient tolerated treatment well    Behavior During Therapy WFL for tasks assessed/performed                 Past Medical History:  Diagnosis Date   Allergy 1975   Environmental   Arthritis 1985   Bilateral carpal tunnel syndrome 01/20/2020   Diabetes mellitus without complication (HCC)    Heart murmur 105w   Hypertension    Past Surgical History:  Procedure Laterality Date   BUNIONECTOMY Right 06/10/2019   HAMMER TOE SURGERY     JOINT REPLACEMENT  2015   MEDIAL PARTIAL KNEE REPLACEMENT     TONSILLECTOMY     Patient Active Problem List   Diagnosis Date Noted   Microalbuminuria 06/12/2023   Pain in both thighs 02/26/2023   Plantar callus 11/23/2022   Left hip pain 11/23/2022   Type 2 diabetes mellitus with diabetic polyneuropathy, without long-term current use of insulin (HCC) 08/28/2021   CKD (chronic kidney disease) stage 2, GFR 60-89 ml/min 08/28/2021   Toe ulcer, left, with necrosis of muscle (HCC) 08/24/2020   Open toe wound, sequela 08/23/2020   Hyperlipidemia LDL goal <70 08/23/2020   Bilateral carpal tunnel syndrome 01/20/2020   DDD (degenerative disc disease), cervical 12/01/2019   Fine motor skill loss 12/01/2019   Tinnitus, left 09/14/2019   ETD (Eustachian tube dysfunction), bilateral  09/14/2019   Neuropathy 06/03/2019   Post-menopausal 06/03/2019   Generalized osteoarthritis of hand 09/11/2018   Colon polyps 05/21/2018   Hyperlipidemia associated with type 2 diabetes mellitus (HCC) 05/06/2018   Seborrheic keratoses 05/06/2018   Dermoid cyst of right ear 05/06/2018   Chest tightness 05/06/2018   Class 1 obesity due to excess calories with serious comorbidity and body mass index (BMI) of 30.0 to 30.9 in adult 11/05/2017   Toenail fungus 10/25/2017   Numbness and tingling in both hands 05/08/2017   Primary osteoarthritis involving multiple joints 05/08/2017   Cellulitis of toe of left foot 12/29/2016   Absent pedal pulses 12/29/2016   Essential hypertension 12/27/2016   Uncontrolled type II diabetes with stage 2 chronic kidney disease 12/27/2016    PCP: Vermell L. Antoniette PA-C  REFERRING PROVIDER: Vermell BECKER Breeback, PA-C  REFERRING DIAG: Bilat leg weakness; bilat hip pain  THERAPY DIAG:  Pain of both hip joints  Muscle weakness (generalized)  Other abnormalities of gait and mobility  Other symptoms and signs involving the musculoskeletal system  Rationale for Evaluation and Treatment: Rehabilitation  ONSET DATE: 11/09/22  SUBJECTIVE:   SUBJECTIVE STATEMENT:  I don't feel like I'm waking any better, but I have had a couple of people tell me I'm walking better. I'm doing the pedaler 15 min a day. I'm having  minor surgery on Friday for sigmoid bone in L foot. She'll be 3 weeks in a boot. Driving triggers the hip pain a little.   POOL ACCESS:  pt has membership to [employer]  EVAL: Patient reports that she has pain in both hips and weakness in both legs. She reports that she had bunion surgery ~ 3-4 years ago and ended up with a staff infection in the foot requiring surgery to remove some of infected bone. She has had altered gait since the time of the surgery. She had a blood blister on L foot with debridement by MD 07/10/23. She is treating the wound  for infection with oral antibiotics and changing dressing 3x/day with antibiotic ointment. Patient reports that she has stiffness when sitting more than one hour and she finds it difficult to stand upright. She has pain in the front of thighs and in hips with standing or walking. She has pain after walking ~ 15 min. Pain resolves after sitting rest for a few minutes. She has difficulty going up and down stairs.  PERTINENT HISTORY: Bunion surgeries bilat feet ~ 3 yrs ago - total of 3 surgeries each foot; TKA L 2008; R 2009; HTN; arthritis; carpal tunnel surgery L; nerve release L elbow PAIN:  Are you having pain? Yes: NPRS scale: 0/10 with walking;  Pain location: R hip  Pain description: sharp  Aggravating factors: standing; walking  Relieving factors: sitting TENS   PRECAUTIONS: Other: diabetic ulcer L midfoot with necrosis of muscle tissue  WEIGHT BEARING RESTRICTIONS: No FALLS:  Has patient fallen in last 6 months? Yes. Number of falls 1 - fell forward onto hands and hit L knee sore but no lasting injury  PATIENT GOALS: get stronger in legs; work on being able to get up and down steps; get rid of some of the pain  NEXT MD VISIT: 09/17/23  OBJECTIVE:  Note: Objective measures were completed at Evaluation unless otherwise noted. DIAGNOSTIC FINDINGS: none available   PATIENT SURVEYS:  FOTO 40; goal 13 FOTO 53  09/24/23   SENSATION: Numbness bilat feet following Morton's neuroma surgery years ago  MUSCLE LENGTH: Hamstrings: Right 65 deg; Left 60 deg Tight hip flexors - unable to extend past neutral   POSTURE: rounded shoulders, forward head, increased thoracic kyphosis, flexed trunk , and weight shift right  PALPATION: Muscular tightness L posterior hip   LOWER EXTREMITY ROM: Tight limited end range knee extension L > R        Tight R hip rotation  Active ROM Right eval Left eval Right 08/14/23 Left 08/14/23  Hip flexion      Hip extension      Hip abduction      Hip  adduction      Hip internal rotation      Hip external rotation      Knee flexion      PRONE  95 90 105 100  Knee extension      Ankle dorsiflexion      Ankle plantarflexion      Ankle inversion      Ankle eversion       (Blank rows = not tested)   09/24/23 HIP IR R 25 DEG, L 15 DEG    ER R 31 31 L 35 DEG  LOWER EXTREMITY MMT: MMT Right eval Left eval Right 09/24/23 Left 09/24/23  Hip flexion 4+ 4- 5- 5  Hip extension 4- 3+ 5 5  Hip abduction 4 3 5- 5  Hip  adduction   5 5  Hip internal rotation      Hip external rotation      Knee flexion 5 5    Knee extension 5 5    Ankle dorsiflexion      Ankle plantarflexion      Ankle inversion      Ankle eversion       (Blank rows = not tested)  FUNCTIONAL TESTS:  5 times sit to stand: 13.5 sec hands crossed over chest  Leg length difference - L LE ~ 3/4 in shorter than R   GAIT: Distance walked: 40 feet  Assistive device utilized: None Level of assistance: Complete Independence Comments: L Trendelenburg gait with hip instability; L LE shorter than R   09/24/23 - eccentric quad weakness with descending stairs; L trendelenberg with gait - improved somewhat with cueing, SLS < 3 seconds B   [facility] Adult PT Treatment:                                                DATE: 09/24/23 Self Care: discussed status and upcoming surgery Therapeutic Activities: Goals assessed, gait, stairs, ROM Therapeutic Exercise: Fwd heel taps x 10 B Hip hike B x 10  SLS B  [facility] Adult PT Treatment:                                                DATE: 09/17/23 Therapeutic Exercise: Bent knee rocking Double knee to chest Bridge with yoga block squeeze 2 x 10 Sidelying reverse clams with ball between knees 2 x 10 Seated hip IR with ball between knees 2x10 Prone hip ext with knee bent x 10 bilat Prone glute squeeze with ball between ankles x 10 Squat tap 2 x 10 Hip hike/depression on 4'' step x 10 Captain morgans x 10 bilat Side step kicking 10# KB 10' x  2 Standing hip diagonal 3# 2 x 10 Standing HS curl 2 x 10  [facility] Adult PT Treatment:                                                DATE: 08/30/23 Pt seen for aquatic therapy today.  Treatment took place in water 3.5-4.75 ft in depth at the [facility] pool. Temp of water was 91.  Pt entered/exited the pool via stairs independent  with bilat rail. PT faces hot tub due to leg length difference * unsupported walking forward/ backwards with cues for vertical trunk and even step length  * farmers carry with bilat rainbow -> yellow -> blue hand float, marching forward/backward (blue too much) * UE on wall:  leg swings into hip abdct/ addct crossing midline 2 x 10 each LE; into hip flexion / ext 2 x 10 each LE;  single leg clams 2 x 10 each * box step L and R * SLS with hands out of water - x 20s each  * SLS with noodle press (thin square noodle) * straddling noodle with UE on wall: cycling (trial with UE on hand floats-difficulty balancing),suspended hip abdct/addct; cross country ski with UE on  wall   Pt requires the buoyancy and hydrostatic pressure of water for support, and to offload joints by unweighting joint load by at least 50 % in navel deep water and by at least 75-80% in chest to neck deep water.  Viscosity of the water is needed for resistance of strengthening. Water current perturbations provides challenge to standing balance requiring increased core activation.  [facility] Adult PT Treatment:                                                DATE: 08/27/23 Therapeutic Exercise: Bent knee rocking Double knee to chest Bridge with yoga block squeeze 2 x 10 Sidelying reverse clams with ball between knees 2 x 10 Prone hip ext with knee bent 2x 10 bilat Seated hip IR with ball squeeze Squat tap 1 x 10 (using back of mat for assist), then cued to step out from wall for functional squat and could only do 5 Hip hike/depression on 4'' step x 10 Captain morgans x 10 bilat Standing hip  diagonal 3# 3 x 10 Standing HS curl 3# 2 x 10    [facility] Adult PT Treatment:                                                DATE: 08/23/23 Pt seen for aquatic therapy today.  Treatment took place in water 3.5-4.75 ft in depth at the [facility] pool. Temp of water was 91.  Pt entered/exited the pool via stairs independent  with bilat rail. * intro to aquatic therapy principles and properties * unsupported walking forward/ backwards with cues * farmers carry with single yellow hand float, marching forward/backward  * UE on yellow hand floats:  3 way LE kick 2 x 5 *UE on wall: leg swings into hip abdct/ addct crossing midline;  single leg clams x 5 each, then 5 each alternating LEs * straddling noodle with UE on wall: cycling; suspended hip abdct/addct; cross country ski  Pt requires the buoyancy and hydrostatic pressure of water for support, and to offload joints by unweighting joint load by at least 50 % in navel deep water and by at least 75-80% in chest to neck deep water.  Viscosity of the water is needed for resistance of strengthening. Water current perturbations provides challenge to standing balance requiring increased core activation.   PATIENT EDUCATION:  Education details: aquatic progressions/ modifications  Person educated: Patient Education method: Programmer, Multimedia, Facilities Manager, Actor cues, Verbal cues, and Handouts Education comprehension: verbalized understanding, returned demonstration, verbal cues required, tactile cues required, and needs further education  HOME EXERCISE PROGRAM: Access Code: [email or web address] URL: [email or web address] Date: 09/24/2023 Prepared by: Mliss  Exercises - Prone Gluteal Sets  - 1 x daily - 7 x weekly - 1 sets - 10 reps - 10 sec  hold - Prone Quadriceps Stretch with Strap  - 1 x daily - 7 x weekly - 1 sets - 3 reps - 30 sec  hold - Prone Hip Extension  - 1 x daily - 7 x weekly - 1 sets - 10 reps - 3 sec  hold - Supine  Piriformis Stretch with Leg Straight  - 1 x daily - 7 x weekly - 1 sets - 3  reps - 30 sec  hold - Hooklying Isometric Clamshell  - 1 x daily - 7 x weekly - 1 sets - 10 reps - 3 sec  hold - Supine Bridge with Resistance Band  - 1 x daily - 7 x weekly - 1-2 sets - 10 reps - 5-10 sec  hold - Sidelying Hip Abduction  - 1 x daily - 7 x weekly - 3 sets - 10 reps - 3-5 sec  hold - Seated Hip Internal Rotation with Ball and Resistance  - 2 x daily - 7 x weekly - 1 sets - 10 reps - 3 sec  hold - Single Leg Stance with Support  - 2 x daily - 7 x weekly - 1 sets - 3 reps - 30 sec  hold - Sidelying Hip Extension in Abduction  - 1 x daily - 7 x weekly - 3 sets - 10 reps - Sidelying Hip Extension in Abduction with Knee Bent  - 1 x daily - 7 x weekly - 3 sets - 10 reps - Side Stepping with Resistance at Thighs  - 1 x daily - 7 x weekly - 3 sets - 10 reps - Prone Heel Squeeze  - 1 x daily - 7 x weekly - 3 sets - 10 reps - 5 sec hold - Hip Hiking on Step  - 1 x daily - 7 x weekly - 1-2 sets - 10 reps - Forward Step Down Touch with Heel  - 1 x daily - 7 x weekly - 1-2 sets - 10 reps - Single Leg Stance  - 1 x daily - 3-4 x weekly - 1 sets - 10 reps  ASSESSMENT:  CLINICAL IMPRESSION: Ayushi is progressing well toward her LTGs meeting 5 of 7, but functionally she still demonstrates significant gluteus med weakness on the left affecting her gait. She cannot hold a SLS on either side for more than 3 seconds. She also has R eccentric quad weakness with descending stairs. She is able to improve her Trendelenberg with cueing, but it returns after about 3-4 strides. HEP updated to address these deficits. She has one more visit before minor foot surgery on Friday which may affect outcomes temporarily. Rhodie continues to demonstrate potential for improvement and would benefit from continued skilled therapy to address impairments.   Stairs are still somewhat challenging.    Eval: Patient is a 73 y.o. female who was seen today  for physical therapy evaluation and treatment for bilat LE weakness and bilat hip pain with symptoms present over the past 6-8 months. 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 She presents today with L Trendelenburg gait pattern with hip instability and pain with standing and walking > 10-15 min. Patient has decreased hip and knee ROM; decreased strength bilat LEs; abnormal gait pattern; pain with functional activities. Patient will benefit from PT to address problems identified.   OBJECTIVE IMPAIRMENTS: Abnormal gait, decreased activity tolerance, decreased balance, decreased mobility, decreased ROM, decreased strength, hypomobility, increased fascial restrictions, improper body mechanics, postural dysfunction, and pain.    GOALS: Goals reviewed with patient? Yes  SHORT TERM GOALS: Target date: 08/28/2023  Independent in initial HEP  Baseline: Goal status: MET  2.  Improve standing alignment and posture  Baseline:  Goal status: MET   LONG TERM GOALS: Target date: 10/09/2023  Increase ROM R hip rotation with patient to demonstrated good I/ER range  Baseline:  Goal status: MET  2.  4+ to 5/5 strength bilat LE's  Baseline:  Goal status: MET  3.  Improve L trendelenberg gait showing improved glute med strength.  Baseline:  Goal status: MODIFIED  4.  Patient reports ability to walk for 20-30 min with minimal to no increase in pain  Baseline:  Goal status: MET 09/23/22 able to walk 20 min  5.  Decrease pain in bilat hips by 50-70% allowing patient to increase activity level  Baseline:  Goal status: MET on left side  6.  Independent in HEP, including aquatic program as indicated  Baseline:  Goal status: IN PROGRESS    7. Improve functional limitation score to 53    Baseline: 40    Goal status: MET  8.  Able to ascend stairs safely without UE support and descend with good quad control Baseline:  Goal status: INITIAL  9.   Improved SLS to 8 seconds or better B showing improved glute med strength and balance Baseline:  Goal status: INITIAL    PLAN:  PT FREQUENCY: 2x/week  PT DURATION: 12 weeks  PLANNED INTERVENTIONS: 97164- PT Re-evaluation, 97110-Therapeutic exercises, 97530- Therapeutic activity, [id number]- Neuromuscular re-education, 97535- Self Care, 02859- Manual therapy, [id number]- Gait training, [phone]- Aquatic Therapy, 97014- Electrical stimulation (unattended), 97035- Ultrasound, 02966- Ionotophoresis 4mg /ml Dexamethasone , Patient/Family education, Balance training, Stair training, Taping, Dry Needling, Joint mobilization, Spinal mobilization, Cryotherapy, and Moist heat  PLAN FOR NEXT SESSION: Focus on SLS, hip hike, eccentric quad, normalizing Trendelenberg on L  Mliss Cummins, PT 09/24/23 1:01 PM

## 2023-09-24 ENCOUNTER — Ambulatory Visit: Payer: Medicare HMO | Admitting: Physical Therapy

## 2023-09-24 ENCOUNTER — Encounter: Payer: Self-pay | Admitting: Physical Therapy

## 2023-09-24 DIAGNOSIS — R2689 Other abnormalities of gait and mobility: Secondary | ICD-10-CM | POA: Diagnosis not present

## 2023-09-24 DIAGNOSIS — M25551 Pain in right hip: Secondary | ICD-10-CM | POA: Diagnosis not present

## 2023-09-24 DIAGNOSIS — M6281 Muscle weakness (generalized): Secondary | ICD-10-CM

## 2023-09-24 DIAGNOSIS — M25552 Pain in left hip: Secondary | ICD-10-CM | POA: Diagnosis not present

## 2023-09-24 DIAGNOSIS — R29898 Other symptoms and signs involving the musculoskeletal system: Secondary | ICD-10-CM

## 2023-09-26 ENCOUNTER — Encounter: Payer: Self-pay | Admitting: Physical Therapy

## 2023-09-26 ENCOUNTER — Ambulatory Visit: Payer: Medicare HMO | Admitting: Physical Therapy

## 2023-09-26 DIAGNOSIS — M25551 Pain in right hip: Secondary | ICD-10-CM | POA: Diagnosis not present

## 2023-09-26 DIAGNOSIS — R29898 Other symptoms and signs involving the musculoskeletal system: Secondary | ICD-10-CM | POA: Diagnosis not present

## 2023-09-26 DIAGNOSIS — M6281 Muscle weakness (generalized): Secondary | ICD-10-CM

## 2023-09-26 DIAGNOSIS — R2689 Other abnormalities of gait and mobility: Secondary | ICD-10-CM | POA: Diagnosis not present

## 2023-09-26 DIAGNOSIS — M25552 Pain in left hip: Secondary | ICD-10-CM | POA: Diagnosis not present

## 2023-09-26 NOTE — Therapy (Addendum)
 OUTPATIENT PHYSICAL THERAPY LOWER EXTREMITY TREATMENT AND DISCHARGE   Patient Name: Ling Flesch MRN: [id number] DOB:1951-04-27, 73 y.o., female Today's Date: 09/26/2023 Dates of service: 07/17/23 - 08/27/23 END OF SESSION:  PT End of Session - 09/26/23 0811     Visit Number 15    Number of Visits 24    Date for PT Re-Evaluation 10/09/23    Authorization Type aet[employer] medicare copay $20    Authorization Time Period 16 VISITS APPROVED FOR PT 07/08/2023-09/06/2023    Authorization - Visit Number 15    Authorization - Number of Visits 16    Progress Note Due on Visit 20    PT Start Time 0810    PT Stop Time 0846    PT Time Calculation (min) 36 min    Activity Tolerance Patient tolerated treatment well    Behavior During Therapy [facility] for tasks assessed/performed                  Past Medical History:  Diagnosis Date   Allergy 1975   Environmental   Arthritis 1985   Bilateral carpal tunnel syndrome 01/20/2020   Diabetes mellitus without complication (HCC)    Heart murmur 105w   Hypertension    Past Surgical History:  Procedure Laterality Date   BUNIONECTOMY Right 06/10/2019   HAMMER TOE SURGERY     JOINT REPLACEMENT  2015   MEDIAL PARTIAL KNEE REPLACEMENT     TONSILLECTOMY     Patient Active Problem List   Diagnosis Date Noted   Microalbuminuria 06/12/2023   Pain in both thighs 02/26/2023   Plantar callus 11/23/2022   Left hip pain 11/23/2022   Type 2 diabetes mellitus with diabetic polyneuropathy, without long-term current use of insulin (HCC) 08/28/2021   CKD (chronic kidney disease) stage 2, GFR 60-89 ml/min 08/28/2021   Toe ulcer, left, with necrosis of muscle (HCC) 08/24/2020   Open toe wound, sequela 08/23/2020   Hyperlipidemia LDL goal <70 08/23/2020   Bilateral carpal tunnel syndrome 01/20/2020   DDD (degenerative disc disease), cervical 12/01/2019   Fine motor skill loss 12/01/2019   Tinnitus, left 09/14/2019   ETD (Eustachian tube dysfunction),  bilateral 09/14/2019   Neuropathy 06/03/2019   Post-menopausal 06/03/2019   Generalized osteoarthritis of hand 09/11/2018   Colon polyps 05/21/2018   Hyperlipidemia associated with type 2 diabetes mellitus (HCC) 05/06/2018   Seborrheic keratoses 05/06/2018   Dermoid cyst of right ear 05/06/2018   Chest tightness 05/06/2018   Class 1 obesity due to excess calories with serious comorbidity and body mass index (BMI) of 30.0 to 30.9 in adult 11/05/2017   Toenail fungus 10/25/2017   Numbness and tingling in both hands 05/08/2017   Primary osteoarthritis involving multiple joints 05/08/2017   Cellulitis of toe of left foot 12/29/2016   Absent pedal pulses 12/29/2016   Essential hypertension 12/27/2016   Uncontrolled type II diabetes with stage 2 chronic kidney disease 12/27/2016    PCP: Lesly Rubenstein L. Caleen Essex PA-C  REFERRING PROVIDER: Lonna Cobb Breeback, PA-C  REFERRING DIAG: Bilat leg weakness; bilat hip pain  THERAPY DIAG:  Pain of both hip joints  Muscle weakness (generalized)  Other abnormalities of gait and mobility  Other symptoms and signs involving the musculoskeletal system  Rationale for Evaluation and Treatment: Rehabilitation  ONSET DATE: 11/09/22  SUBJECTIVE:   SUBJECTIVE STATEMENT:  I was tired when I left the other day. Able to stand over an hour yesterday at  church. 4/10 pain today.  POOL ACCESS:  pt has membership  to [facility]  EVAL: Patient reports that she has pain in both hips and weakness in both legs. She reports that she had bunion surgery ~ 3-4 years ago and ended up with a staff infection in the foot requiring surgery to remove some of infected bone. She has had altered gait since the time of the surgery. She had a blood blister on L foot with debridement by MD 07/10/23. She is treating the wound for infection with oral antibiotics and changing dressing 3x/day with antibiotic ointment. Patient reports that she has stiffness when sitting more than one  hour and she finds it difficult to stand upright. She has pain in the front of thighs and in hips with standing or walking. She has pain after walking ~ 15 min. Pain resolves after sitting rest for a few minutes. She has difficulty going up and down stairs.  PERTINENT HISTORY: Bunion surgeries bilat feet ~ 3 yrs ago - total of 3 surgeries each foot; TKA L 2008; R 2009; HTN; arthritis; carpal tunnel surgery L; nerve release L elbow PAIN:  Are you having pain? Yes: NPRS scale: 4/10.   Pain location: R hip  Pain description: sharp  Aggravating factors: standing; walking  Relieving factors: sitting TENS   PRECAUTIONS: Other: diabetic ulcer L midfoot with necrosis of muscle tissue  WEIGHT BEARING RESTRICTIONS: No FALLS:  Has patient fallen in last 6 months? Yes. Number of falls 1 - fell forward onto hands and hit L knee sore but no lasting injury  PATIENT GOALS: get stronger in legs; work on being able to get up and down steps; get rid of some of the pain  NEXT MD VISIT: 09/17/23  OBJECTIVE:  Note: Objective measures were completed at Evaluation unless otherwise noted. DIAGNOSTIC FINDINGS: none available   PATIENT SURVEYS:  FOTO 40; goal 55 FOTO 53  09/24/23   SENSATION: Numbness bilat feet following Morton's neuroma surgery years ago  MUSCLE LENGTH: Hamstrings: Right 65 deg; Left 60 deg Tight hip flexors - unable to extend past neutral   POSTURE: rounded shoulders, forward head, increased thoracic kyphosis, flexed trunk , and weight shift right  PALPATION: Muscular tightness L posterior hip   LOWER EXTREMITY ROM: Tight limited end range knee extension L > R        Tight R hip rotation  Active ROM Right eval Left eval Right 08/14/23 Left 08/14/23  Hip flexion      Hip extension      Hip abduction      Hip adduction      Hip internal rotation      Hip external rotation      Knee flexion      PRONE  95 90 105 100  Knee extension      Ankle dorsiflexion      Ankle  plantarflexion      Ankle inversion      Ankle eversion       (Blank rows = not tested)   09/24/23 HIP IR R 25 DEG, L 15 DEG    ER R 31 31 L 35 DEG  LOWER EXTREMITY MMT: MMT Right eval Left eval Right 09/24/23 Left 09/24/23  Hip flexion 4+ 4- 5- 5  Hip extension 4- 3+ 5 5  Hip abduction 4 3 5- 5  Hip adduction   5 5  Hip internal rotation      Hip external rotation      Knee flexion 5 5    Knee extension 5 5  Ankle dorsiflexion      Ankle plantarflexion      Ankle inversion      Ankle eversion       (Blank rows = not tested)  FUNCTIONAL TESTS:  5 times sit to stand: 13.5 sec hands crossed over chest  Leg length difference - L LE ~ 3/4 in shorter than R   GAIT: Distance walked: 40 feet  Assistive device utilized: None Level of assistance: Complete Independence Comments: L Trendelenburg gait with hip instability; L LE shorter than R   09/24/23 - eccentric quad weakness with descending stairs; L trendelenberg with gait - improved somewhat with cueing, SLS < 3 seconds B  [facility] Adult PT Treatment:                                                DATE: 09/26/23 Therapeutic Exercise: Fwd heel taps 2 x 10 B on 4 inch step Hip hike B x 20 on 4 inch step SLS B Squat tap to chair 2 x 10 Bridge with yoga block squeeze 2 x 10 Sidelying reverse clams with ball between knees 2 x 10 Prone glute squeeze with ball between ankles x 10, then with HS curl x 10 Seated hip IR with ball between knees 2x10 Hooklying left hip IR with 3x30 sec Standing hip diagonal 3# 2 x 10 Standing HS curl 3# 4 x 5 cues to keep left hip from ABD  [facility] Adult PT Treatment:                                                DATE: 09/24/23 Self Care: discussed status and upcoming surgery Therapeutic Activities: Goals assessed, gait, stairs, ROM Therapeutic Exercise: Fwd heel taps x 10 B Hip hike B x 10  SLS B  [facility] Adult PT Treatment:                                                DATE: 09/17/23 Therapeutic  Exercise: Bent knee rocking Double knee to chest Bridge with yoga block squeeze 2 x 10 Sidelying reverse clams with ball between knees 2 x 10 Seated hip IR with ball between knees 2x10 Prone hip ext with knee bent x 10 bilat Prone glute squeeze with ball between ankles x 10 Squat tap 2 x 10 Hip hike/depression on 4'' step x 10 Captain morgans x 10 bilat Side step kicking 10# KB 10' x 2 Standing hip diagonal 3# 2 x 10 Standing HS curl 2 x 10  [facility] Adult PT Treatment:                                                DATE: 08/30/23 Pt seen for aquatic therapy today.  Treatment took place in water 3.5-4.75 ft in depth at the [facility] pool. Temp of water was 91.  Pt entered/exited the pool via stairs independent  with bilat rail. PT faces hot tub due to leg length difference *  unsupported walking forward/ backwards with cues for vertical trunk and even step length  * farmers carry with bilat rainbow -> yellow -> blue hand float, marching forward/backward (blue too much) * UE on wall:  leg swings into hip abdct/ addct crossing midline 2 x 10 each LE; into hip flexion / ext 2 x 10 each LE;  single leg clams 2 x 10 each * box step L and R * SLS with hands out of water - x 20s each  * SLS with noodle press (thin square noodle) * straddling noodle with UE on wall: cycling (trial with UE on hand floats-difficulty balancing),suspended hip abdct/addct; cross country ski with UE on wall   Pt requires the buoyancy and hydrostatic pressure of water for support, and to offload joints by unweighting joint load by at least 50 % in navel deep water and by at least 75-80% in chest to neck deep water.  Viscosity of the water is needed for resistance of strengthening. Water current perturbations provides challenge to standing balance requiring increased core activation.  [facility] Adult PT Treatment:                                                DATE: 08/27/23 Therapeutic Exercise: Bent knee  rocking Double knee to chest Bridge with yoga block squeeze 2 x 10 Sidelying reverse clams with ball between knees 2 x 10 Prone hip ext with knee bent 2x 10 bilat Seated hip IR with ball squeeze Squat tap 1 x 10 (using back of mat for assist), then cued to step out from wall for functional squat and could only do 5 Hip hike/depression on 4'' step x 10 Captain morgans x 10 bilat Standing hip diagonal 3# 3 x 10 Standing HS curl 3# 2 x 10   PATIENT EDUCATION:  Education details: aquatic progressions/ modifications  Person educated: Patient Education method: Programmer, multimedia, Demonstration, Actor cues, Verbal cues, and Handouts Education comprehension: verbalized understanding, returned demonstration, verbal cues required, tactile cues required, and needs further education  HOME EXERCISE PROGRAM: Access Code: [email or web address] URL: [email or web address] Date: 09/24/2023 Prepared by: Raynelle Fanning  Exercises - Prone Gluteal Sets  - 1 x daily - 7 x weekly - 1 sets - 10 reps - 10 sec  hold - Prone Quadriceps Stretch with Strap  - 1 x daily - 7 x weekly - 1 sets - 3 reps - 30 sec  hold - Prone Hip Extension  - 1 x daily - 7 x weekly - 1 sets - 10 reps - 3 sec  hold - Supine Piriformis Stretch with Leg Straight  - 1 x daily - 7 x weekly - 1 sets - 3 reps - 30 sec  hold - Hooklying Isometric Clamshell  - 1 x daily - 7 x weekly - 1 sets - 10 reps - 3 sec  hold - Supine Bridge with Resistance Band  - 1 x daily - 7 x weekly - 1-2 sets - 10 reps - 5-10 sec  hold - Sidelying Hip Abduction  - 1 x daily - 7 x weekly - 3 sets - 10 reps - 3-5 sec  hold - Seated Hip Internal Rotation with Ball and Resistance  - 2 x daily - 7 x weekly - 1 sets - 10 reps - 3 sec  hold - Single Leg Stance with Support  - 2 x  daily - 7 x weekly - 1 sets - 3 reps - 30 sec  hold - Sidelying Hip Extension in Abduction  - 1 x daily - 7 x weekly - 3 sets - 10 reps - Sidelying Hip Extension in Abduction with Knee Bent  - 1 x daily -  7 x weekly - 3 sets - 10 reps - Side Stepping with Resistance at Thighs  - 1 x daily - 7 x weekly - 3 sets - 10 reps - Prone Heel Squeeze  - 1 x daily - 7 x weekly - 3 sets - 10 reps - 5 sec hold - Hip Hiking on Step  - 1 x daily - 7 x weekly - 1-2 sets - 10 reps - Forward Step Down Touch with Heel  - 1 x daily - 7 x weekly - 1-2 sets - 10 reps - Single Leg Stance  - 1 x daily - 3-4 x weekly - 1 sets - 10 reps  ASSESSMENT:  CLINICAL IMPRESSION: Deziree reports increased standing tolerance yesterday at church to over an hour. She was 10 min late today. Good tolerance to TE. L hip fatigues fairly quickly and she needs cueing to avoid Trendelenberg. L hip IR stretch added to HEP. Patient having foot surgery tomorrow and will return when her surgeon okays it.   Eval: Patient is a 73 y.o. female who was seen today for physical therapy evaluation and treatment for bilat LE weakness and bilat hip pain with symptoms present over the past 6-8 months. 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 She presents today with L Trendelenburg gait pattern with hip instability and pain with standing and walking > 10-15 min. Patient has decreased hip and knee ROM; decreased strength bilat LEs; abnormal gait pattern; pain with functional activities. Patient will benefit from PT to address problems identified.   OBJECTIVE IMPAIRMENTS: Abnormal gait, decreased activity tolerance, decreased balance, decreased mobility, decreased ROM, decreased strength, hypomobility, increased fascial restrictions, improper body mechanics, postural dysfunction, and pain.    GOALS: Goals reviewed with patient? Yes  SHORT TERM GOALS: Target date: 08/28/2023  Independent in initial HEP  Baseline: Goal status: MET  2.  Improve standing alignment and posture  Baseline:  Goal status: MET   LONG TERM GOALS: Target date: 10/09/2023  Increase ROM R hip rotation with patient to demonstrated  good I/ER range  Baseline:  Goal status: MET  2.  4+ to 5/5 strength bilat LE's  Baseline:  Goal status: MET  3.  Improve L trendelenberg gait showing improved glute med strength.  Baseline:  Goal status: MODIFIED  4.  Patient reports ability to walk for 20-30 min with minimal to no increase in pain  Baseline:  Goal status: MET 09/23/22 able to walk 20 min  5.  Decrease pain in bilat hips by 50-70% allowing patient to increase activity level  Baseline:  Goal status: MET on left side  6.  Independent in HEP, including aquatic program as indicated  Baseline:  Goal status: IN PROGRESS    7. Improve functional limitation score to 53    Baseline: 40    Goal status: MET  8.  Able to ascend stairs safely without UE support and descend with good quad control Baseline:  Goal status: INITIAL  9.  Improved SLS to 8 seconds or better B showing improved glute med strength and balance Baseline:  Goal status: INITIAL    PLAN:  PT FREQUENCY:  2x/week  PT DURATION: 12 weeks  PLANNED INTERVENTIONS: 97164- PT Re-evaluation, 97110-Therapeutic exercises, 97530- Therapeutic activity, 97112- Neuromuscular re-education, 97535- Self Care, 16109- Manual therapy, [phone]- Gait training, [phone]- Aquatic Therapy, 97014- Electrical stimulation (unattended), 97035- Ultrasound, 91478- Ionotophoresis 4mg /ml Dexamethasone, Patient/Family education, Balance training, Stair training, Taping, Dry Needling, Joint mobilization, Spinal mobilization, Cryotherapy, and Moist heat  PLAN FOR NEXT SESSION: Focus on SLS, hip hike, eccentric quad, normalizing Trendelenberg on L  Solon Palm, PT 09/26/23 8:47 AM PHYSICAL THERAPY DISCHARGE SUMMARY  Visits from Start of Care: 15  Current functional level related to goals / functional outcomes: Improving standing tolerance, improved strength and mobility   Remaining deficits: See above   Education / Equipment: HEP   Patient agrees to discharge. Patient goals  were partially met. Patient is being discharged due to a change in medical status.  Reggy Eye, PT,DPT02/28/258:42 AM

## 2023-09-27 DIAGNOSIS — E11621 Type 2 diabetes mellitus with foot ulcer: Secondary | ICD-10-CM | POA: Diagnosis not present

## 2023-09-27 DIAGNOSIS — E1142 Type 2 diabetes mellitus with diabetic polyneuropathy: Secondary | ICD-10-CM | POA: Diagnosis not present

## 2023-09-27 DIAGNOSIS — M25872 Other specified joint disorders, left ankle and foot: Secondary | ICD-10-CM | POA: Diagnosis not present

## 2023-09-27 DIAGNOSIS — I10 Essential (primary) hypertension: Secondary | ICD-10-CM | POA: Diagnosis not present

## 2023-10-05 ENCOUNTER — Ambulatory Visit
Admission: EM | Admit: 2023-10-05 | Discharge: 2023-10-05 | Disposition: A | Discharge status: Home / Self Care | Payer: Medicare HMO

## 2023-10-05 ENCOUNTER — Other Ambulatory Visit: Payer: Self-pay

## 2023-10-05 DIAGNOSIS — Z4801 Encounter for change or removal of surgical wound dressing: Secondary | ICD-10-CM | POA: Diagnosis not present

## 2023-10-05 NOTE — ED Triage Notes (Addendum)
Pt presents to [facility] with co of recent left foot surgery. Pt reports she was informed she needed to change her dressing if she got her dressing wet at all. Pt reports she has had some loose stool this morning that incidentally got on the bandage she thinks is most likely due to the strong antibiotic. Pt reports she called the surgeon who requested she come in for a bandage change.

## 2023-10-16 ENCOUNTER — Ambulatory Visit
Admission: EM | Admit: 2023-10-16 | Discharge: 2023-10-16 | Disposition: A | Discharge status: Home / Self Care | Payer: Medicare HMO | Attending: Family Medicine | Admitting: Family Medicine

## 2023-10-16 DIAGNOSIS — J101 Influenza due to other identified influenza virus with other respiratory manifestations: Secondary | ICD-10-CM

## 2023-10-16 LAB — POCT INFLUENZA A/B
Influenza A, POC: POSITIVE — AB
Influenza B, POC: NEGATIVE

## 2023-10-16 MED ORDER — OSELTAMIVIR PHOSPHATE 75 MG PO CAPS: 75.0000 mg | ORAL_CAPSULE | Freq: Two times a day (BID) | ORAL | 0 refills | Status: DC

## 2023-10-16 MED ORDER — PREDNISONE 20 MG PO TABS: 40.0000 mg | ORAL_TABLET | Freq: Every day | ORAL | 0 refills | Status: DC

## 2023-10-16 NOTE — ED Provider Notes (Signed)
 [facility]    CSN: [id number] Arrival date & time: 10/16/23  1827      History   Chief Complaint Chief Complaint  Patient presents with   Cough    HPI Teresa Spence is a 73 y.o. female.   HPI this is the fourth day of illness for Teresa Spence.  She has had fever, cough, fatigue.  Some diarrhea.  She states that she did have a flu shot this year.  She states that she did a COVID test that was negative.  She is feeling a lot of fatigue and achiness.  Past Medical History:  Diagnosis Date   Allergy 1975   Environmental   Arthritis 1985   Bilateral carpal tunnel syndrome 01/20/2020   Diabetes mellitus without complication (HCC)    Heart murmur 105w   Hypertension     Patient Active Problem List   Diagnosis Date Noted   Microalbuminuria 06/12/2023   Pain in both thighs 02/26/2023   Plantar callus 11/23/2022   Left hip pain 11/23/2022   Type 2 diabetes mellitus with diabetic polyneuropathy, without long-term current use of insulin (HCC) 08/28/2021   CKD (chronic kidney disease) stage 2, GFR 60-89 ml/min 08/28/2021   Toe ulcer, left, with necrosis of muscle (HCC) 08/24/2020   Open toe wound, sequela 08/23/2020   Hyperlipidemia LDL goal <70 08/23/2020   Bilateral carpal tunnel syndrome 01/20/2020   DDD (degenerative disc disease), cervical 12/01/2019   Fine motor skill loss 12/01/2019   Tinnitus, left 09/14/2019   ETD (Eustachian tube dysfunction), bilateral 09/14/2019   Neuropathy 06/03/2019   Post-menopausal 06/03/2019   Generalized osteoarthritis of hand 09/11/2018   Colon polyps 05/21/2018   Hyperlipidemia associated with type 2 diabetes mellitus (HCC) 05/06/2018   Seborrheic keratoses 05/06/2018   Dermoid cyst of right ear 05/06/2018   Chest tightness 05/06/2018   Class 1 obesity due to excess calories with serious comorbidity and body mass index (BMI) of 30.0 to 30.9 in adult 11/05/2017   Toenail fungus 10/25/2017   Numbness and tingling in both hands  05/08/2017   Primary osteoarthritis involving multiple joints 05/08/2017   Cellulitis of toe of left foot 12/29/2016   Absent pedal pulses 12/29/2016   Essential hypertension 12/27/2016   Uncontrolled type II diabetes with stage 2 chronic kidney disease 12/27/2016    Past Surgical History:  Procedure Laterality Date   BUNIONECTOMY Right 06/10/2019   HAMMER TOE SURGERY     JOINT REPLACEMENT  2015   MEDIAL PARTIAL KNEE REPLACEMENT     TONSILLECTOMY      OB History   No obstetric history on file.      Home Medications    Prior to Admission medications   Medication Sig Start Date End Date Taking? Authorizing Provider  oseltamivir  (TAMIFLU ) 75 MG capsule Take 1 capsule (75 mg total) by mouth every 12 (twelve) hours. 10/16/23  Yes Maranda Jamee Jacob, MD  predniSONE  (DELTASONE ) 20 MG tablet Take 2 tablets (40 mg total) by mouth daily with breakfast. 10/16/23  Yes Maranda Jamee Jacob, MD  cholecalciferol (VITAMIN D3) 25 MCG (1000 UT) tablet Take 1,000 Units by mouth daily.    [provider]  Continuous Blood Gluc Sensor (FREESTYLE LIBRE 3 SENSOR) MISC 1 Device by Does not apply route every 14 (fourteen) days. 11/20/22   Breeback, [name] L, PA-C  losartan -hydrochlorothiazide (HYZAAR) 100-25 MG tablet TAKE 1 TABLET BY MOUTH DAILY 06/05/23   Breeback, [name] L, PA-C  metFORMIN  (GLUCOPHAGE ) 1000 MG tablet TAKE 1 TABLET BY  MOUTH TWICE A DAY WITH MEALS 02/26/23   Breeback, [name] L, PA-C  rosuvastatin  (CRESTOR ) 10 MG tablet Take 1 tablet (10 mg total) by mouth daily. 09/17/23   Breeback, [name] L, PA-C  Semaglutide ,0.25 or 0.5MG /DOS, (OZEMPIC , 0.25 OR 0.5 MG/DOSE,) 2 MG/3ML SOPN Inject 0.5 mg into the skin once a week. 09/17/23   Teresa Vermell CROME, PA-C    Family History Family History  Problem Relation Age of Onset   Diabetes Mother    Hypertension Mother    Arthritis Mother    Hypertension Father    COPD Father    Heart attack Brother    Stroke Maternal Grandmother    Heart attack Maternal  Grandfather    Emphysema Neg Hx     Social History Social History   Tobacco Use   Smoking status: Former   Smokeless tobacco: Never  Vaping Use   Vaping status: Never Used  Substance Use Topics   Alcohol use: Yes    Alcohol/week: 1.0 standard drink of alcohol    Types: 1 Glasses of wine per week    Comment: 1 glass a month   Drug use: No     Allergies   Lamisil  [terbinafine ] and Lipitor [atorvastatin ]   Review of Systems Review of Systems See HPI  Physical Exam Triage Vital Signs ED Triage Vitals  Encounter Vitals Group     BP 10/16/23 1846 (!) 155/88     Systolic BP Percentile --      Diastolic BP Percentile --      Pulse Rate 10/16/23 1846 98     Resp 10/16/23 1846 17     Temp 10/16/23 1846 98.6 F (37 C)     Temp Source 10/16/23 1846 Oral     SpO2 10/16/23 1846 97 %     Weight --      Height --      Head Circumference --      Peak Flow --      Pain Score 10/16/23 1848 0     Pain Loc --      Pain Education --      Exclude from Growth Chart --    No data found.  Updated Vital Signs BP (!) 155/88 (BP Location: Left Arm)   Pulse 98   Temp 98.6 F (37 C) (Oral)   Resp 17   SpO2 97%       Physical Exam Constitutional:      General: She is not in acute distress.    Appearance: She is well-developed. She is ill-appearing.  HENT:     Head: Normocephalic and atraumatic.  Eyes:     Conjunctiva/sclera: Conjunctivae normal.     Pupils: Pupils are equal, round, and reactive to light.  Cardiovascular:     Rate and Rhythm: Normal rate.     Heart sounds: Normal heart sounds.  Pulmonary:     Effort: Pulmonary effort is normal. No respiratory distress.     Breath sounds: Wheezing present.  Abdominal:     General: There is no distension.     Palpations: Abdomen is soft.  Musculoskeletal:        General: Normal range of motion.     Cervical back: Normal range of motion.  Skin:    General: Skin is warm and dry.  Neurological:     Mental Status: She  is alert.      UC Treatments / Results  Labs (all labs ordered are listed, but only abnormal results are displayed)  Labs Reviewed  POCT INFLUENZA A/B - Abnormal; Notable for the following components:      Result Value   Influenza A, POC Positive (*)    All other components within normal limits    EKG   Radiology No results found.  Procedures Procedures (including critical care time)  Medications Ordered in UC Medications - No data to display  Initial Impression / Assessment and Plan / UC Course  I have reviewed the triage vital signs and the nursing notes.  Pertinent labs & imaging results that were available during my care of the patient were reviewed by me and considered in my medical decision making (see chart for details).    Discussed home treatment.  Patient has a fair amount of wheezing.Will treat this with prednisone  Final Clinical Impressions(s) / UC Diagnoses   Final diagnoses:  Influenza A     Discharge Instructions      Take Tamiflu  2 times a day for 5 days Drink lots of water May take over-the-counter cough or cold medicine as needed Take prednisone  once a day for 5 days See your doctor if not improving by early next week   ED Prescriptions     Medication Sig Dispense Auth. Provider   oseltamivir  (TAMIFLU ) 75 MG capsule Take 1 capsule (75 mg total) by mouth every 12 (twelve) hours. 10 capsule Maranda Jamee Jacob, MD   predniSONE  (DELTASONE ) 20 MG tablet Take 2 tablets (40 mg total) by mouth daily with breakfast. 10 tablet Maranda Jamee Jacob, MD      PDMP not reviewed this encounter.   Maranda Jamee Jacob, MD 10/16/23 2001

## 2023-10-16 NOTE — ED Triage Notes (Addendum)
 Pt c/o cough since Saturday am. Fatigue which has improved. Some diarrhea. Imodium, tylenol and cough drops prn. Had flu shot this year. COVID neg at home today.

## 2023-10-16 NOTE — Discharge Instructions (Signed)
Take Tamiflu 2 times a day for 5 days Drink lots of water May take over-the-counter cough or cold medicine as needed Take prednisone once a day for 5 days See your doctor if not improving by early next week

## 2023-12-08 ENCOUNTER — Other Ambulatory Visit: Payer: Self-pay | Admitting: Physician Assistant

## 2023-12-08 DIAGNOSIS — I10 Essential (primary) hypertension: Secondary | ICD-10-CM

## 2023-12-10 DIAGNOSIS — K635 Polyp of colon: Secondary | ICD-10-CM | POA: Diagnosis not present

## 2023-12-10 DIAGNOSIS — Z09 Encounter for follow-up examination after completed treatment for conditions other than malignant neoplasm: Secondary | ICD-10-CM | POA: Diagnosis not present

## 2023-12-10 DIAGNOSIS — Z860100 Personal history of colon polyps, unspecified: Secondary | ICD-10-CM | POA: Diagnosis not present

## 2023-12-10 DIAGNOSIS — Z860101 Personal history of adenomatous and serrated colon polyps: Secondary | ICD-10-CM | POA: Diagnosis not present

## 2023-12-10 DIAGNOSIS — Z1211 Encounter for screening for malignant neoplasm of colon: Secondary | ICD-10-CM | POA: Diagnosis not present

## 2023-12-10 DIAGNOSIS — D123 Benign neoplasm of transverse colon: Secondary | ICD-10-CM | POA: Diagnosis not present

## 2023-12-10 LAB — HM COLONOSCOPY

## 2023-12-17 ENCOUNTER — Ambulatory Visit: Payer: Medicare HMO | Admitting: Physician Assistant

## 2024-01-03 DIAGNOSIS — M2042 Other hammer toe(s) (acquired), left foot: Secondary | ICD-10-CM | POA: Diagnosis not present

## 2024-01-03 DIAGNOSIS — E1142 Type 2 diabetes mellitus with diabetic polyneuropathy: Secondary | ICD-10-CM | POA: Diagnosis not present

## 2024-01-03 DIAGNOSIS — M25872 Other specified joint disorders, left ankle and foot: Secondary | ICD-10-CM | POA: Diagnosis not present

## 2024-01-20 ENCOUNTER — Ambulatory Visit: Admitting: Physician Assistant

## 2024-01-21 ENCOUNTER — Ambulatory Visit: Admitting: Physician Assistant

## 2024-01-21 ENCOUNTER — Encounter: Payer: Self-pay | Admitting: Physician Assistant

## 2024-01-21 VITALS — BP 144/81 | HR 74 | Ht 65.0 in | Wt 183.0 lb

## 2024-01-21 DIAGNOSIS — R809 Proteinuria, unspecified: Secondary | ICD-10-CM

## 2024-01-21 DIAGNOSIS — I10 Essential (primary) hypertension: Secondary | ICD-10-CM | POA: Diagnosis not present

## 2024-01-21 DIAGNOSIS — E1142 Type 2 diabetes mellitus with diabetic polyneuropathy: Secondary | ICD-10-CM

## 2024-01-21 DIAGNOSIS — E11649 Type 2 diabetes mellitus with hypoglycemia without coma: Secondary | ICD-10-CM

## 2024-01-21 DIAGNOSIS — Z7984 Long term (current) use of oral hypoglycemic drugs: Secondary | ICD-10-CM | POA: Diagnosis not present

## 2024-01-21 DIAGNOSIS — Z6830 Body mass index (BMI) 30.0-30.9, adult: Secondary | ICD-10-CM | POA: Diagnosis not present

## 2024-01-21 DIAGNOSIS — E785 Hyperlipidemia, unspecified: Secondary | ICD-10-CM | POA: Diagnosis not present

## 2024-01-21 DIAGNOSIS — E6609 Other obesity due to excess calories: Secondary | ICD-10-CM

## 2024-01-21 DIAGNOSIS — E66811 Obesity, class 1: Secondary | ICD-10-CM

## 2024-01-21 LAB — POCT GLYCOSYLATED HEMOGLOBIN (HGB A1C): Hemoglobin A1C: 7.9 % — AB (ref 4.0–5.6)

## 2024-01-21 NOTE — Progress Notes (Signed)
 Established Patient Office Visit  Subjective   Patient ID: Teresa Spence, female    DOB: 23-Feb-1951  Age: 73 y.o. MRN: [id number]  Chief Complaint  Patient presents with   Medical Management of Chronic Issues    Last A1c 7.5    HPI Pt is a 73 yo obese female with T2DM, HTN, HLD, polyneuropathy, microalbuminuria who presents to the clinic for 3 month follow up.   Pt is not checking her sugars. She is taking metformin  daily but has struggled to afford ozempic  and farxiga . She is active and walking regularly. She is trying to keep to low sugar/carb diet. She denies any CP, palpitations, headaches or vision changes. She has been out of ozempic  for at least 2 months.   .. 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 12/27/2016   Cellulitis of toe of left foot 12/29/2016   Absent pedal pulses 12/29/2016   Numbness and tingling in both hands 05/08/2017   Primary osteoarthritis involving multiple joints 05/08/2017   Toenail fungus 10/25/2017   Class 1 obesity due to excess calories with serious comorbidity and body mass index (BMI) of 30.0 to 30.9 in adult 11/05/2017   Hyperlipidemia associated with type 2 diabetes mellitus (HCC) 05/06/2018   Seborrheic keratoses 05/06/2018   Dermoid cyst of right ear 05/06/2018   Chest tightness 05/06/2018   Colon polyps 05/21/2018   Generalized osteoarthritis of hand 09/11/2018   Neuropathy 06/03/2019   Post-menopausal 06/03/2019   Tinnitus, left 09/14/2019   ETD (Eustachian tube dysfunction), bilateral 09/14/2019   DDD (degenerative disc disease), cervical 12/01/2019   Fine motor skill loss 12/01/2019   Bilateral carpal tunnel syndrome 01/20/2020   Open toe wound, sequela 08/23/2020   Hyperlipidemia LDL goal <70 08/23/2020   Toe ulcer, left, with necrosis of muscle (HCC) 08/24/2020   Type 2 diabetes mellitus with diabetic  polyneuropathy, without long-term current use of insulin (HCC) 08/28/2021   CKD (chronic kidney disease) stage 2, GFR 60-89 ml/min 08/28/2021   Plantar callus 11/23/2022   Left hip pain 11/23/2022   Pain in both thighs 02/26/2023   Microalbuminuria 06/12/2023   Resolved Ambulatory Problems    Diagnosis Date Noted   Palpitations 12/27/2016   Blue toes 12/29/2016   Choking 05/08/2017   Past Medical History:  Diagnosis Date   Allergy 1975   Arthritis 1985   Diabetes mellitus without complication (HCC)    Heart murmur 105w   Hypertension      ROS See HPI.    Objective:     BP (!) 144/81   Pulse 74   Ht 5\' 5"  (1.651 m)   Wt 183 lb (83 kg)   SpO2 99%   BMI 30.45 kg/m  BP Readings from Last 3 Encounters:  01/21/24 (!) 144/81  10/16/23 (!) 155/88  10/05/23 127/82   Wt Readings from Last 3 Encounters:  01/21/24 183 lb (83 kg)  09/17/23 184 lb (83.5 kg)  06/12/23 183 lb (83 kg)    . Results for orders placed or performed in visit on 01/21/24  POCT HgB A1C   Collection Time: 01/21/24  9:20 AM  Result Value Ref Range   Hemoglobin A1C 7.9 (A) 4.0 - 5.6 %   HbA1c POC (<> result, manual entry)     HbA1c, POC (prediabetic range)     HbA1c, POC (controlled diabetic range)       Physical Exam  Constitutional:      Appearance: Normal appearance.  HENT:     Head: Normocephalic.  Cardiovascular:     Rate and Rhythm: Normal rate and regular rhythm.  Pulmonary:     Effort: Pulmonary effort is normal.     Breath sounds: Normal breath sounds.  Neurological:     General: No focal deficit present.     Mental Status: She is alert and oriented to person, place, and time.  Psychiatric:        Mood and Affect: Mood normal.        The 10-year ASCVD risk score (Arnett DK, et al., 2019) is: 40.1%    Assessment & Plan:  Aaron AasAaron AasDonesha was seen today for medical management of chronic issues.  Diagnoses and all orders for this visit:  Uncontrolled type 2 diabetes mellitus  with hypoglycemia, unspecified hypoglycemia coma status (HCC) -     POCT HgB A1C -     rosuvastatin  (CRESTOR ) 10 MG tablet; Take 1 tablet (10 mg total) by mouth daily. -     Semaglutide ,0.25 or 0.5MG /DOS, (OZEMPIC , 0.25 OR 0.5 MG/DOSE,) 2 MG/3ML SOPN; Inject 0.5 mg into the skin once a week. -     metFORMIN  (GLUCOPHAGE ) 1000 MG tablet; TAKE 1 TABLET BY MOUTH TWICE A DAY WITH MEALS -     empagliflozin  (JARDIANCE ) 25 MG TABS tablet; Take 1 tablet (25 mg total) by mouth daily before breakfast. -     AMB Referral [facility]BCI Care Management  Hyperlipidemia LDL goal <70 -     rosuvastatin  (CRESTOR ) 10 MG tablet; Take 1 tablet (10 mg total) by mouth daily. -     Semaglutide ,0.25 or 0.5MG /DOS, (OZEMPIC , 0.25 OR 0.5 MG/DOSE,) 2 MG/3ML SOPN; Inject 0.5 mg into the skin once a week.  Class 1 obesity due to excess calories with serious comorbidity and body mass index (BMI) of 30.0 to 30.9 in adult  Essential hypertension -     empagliflozin  (JARDIANCE ) 25 MG TABS tablet; Take 1 tablet (25 mg total) by mouth daily before breakfast. -     AMB Referral VBCI Care Management  Microalbuminuria -     empagliflozin  (JARDIANCE ) 25 MG TABS tablet; Take 1 tablet (25 mg total) by mouth daily before breakfast. -     AMB Referral VBCI Care Management   A1C not to goal Need to be on ozempic  and jardiance  Will make pharmacy referral to help with medication and cost BP not to goal Recheck in 2 weeks Microalbuminuria, needs to be on SGLT-2 sent jardiance .  On statin Needs eye exam Foot exam UTD Vaccines UTD Follow up in 3 months   Sandy Crumb, PA-C

## 2024-01-27 ENCOUNTER — Encounter: Payer: Self-pay | Admitting: Physician Assistant

## 2024-01-27 MED ORDER — ROSUVASTATIN CALCIUM 10 MG PO TABS: 10.0000 mg | ORAL_TABLET | Freq: Every day | ORAL | 1 refills | Status: DC

## 2024-01-27 MED ORDER — OZEMPIC (0.25 OR 0.5 MG/DOSE) 2 MG/3ML ~~LOC~~ SOPN
0.5000 mg | PEN_INJECTOR | SUBCUTANEOUS | 0 refills | Status: DC
Start: 2024-01-27 — End: 2024-06-02

## 2024-01-27 MED ORDER — METFORMIN HCL 1000 MG PO TABS: ORAL_TABLET | ORAL | 3 refills | Status: DC

## 2024-01-27 MED ORDER — EMPAGLIFLOZIN 25 MG PO TABS: 25.0000 mg | ORAL_TABLET | Freq: Every day | ORAL | 0 refills | Status: DC

## 2024-01-30 ENCOUNTER — Telehealth: Payer: Self-pay | Admitting: *Deleted

## 2024-01-30 NOTE — Progress Notes (Signed)
 Care Guide Pharmacy Note  01/30/2024 Name: Lucynda Rosano MRN: [id number] DOB: 1950/10/26  Referred By: Araceli Knight, PA-C Reason for referral: Complex Care Management (Outreach to schedule referral with pharmacist )   Marshayla Mitschke is a 73 y.o. year old female who is a primary care patient of Breeback, [name] L, PA-C.  Natalie Mceuen was referred to the pharmacist for assistance related to: DMII  Successful contact was made with the patient to discuss pharmacy services including being ready for the pharmacist to call at least 5 minutes before the scheduled appointment time and to have medication bottles and any blood pressure readings ready for review. The patient agreed to meet with the pharmacist via telephone visit on 02/12/2024  Kandis Ormond, CMA [place]  [facility], [facility] Guide Direct Dial: [phone]  Fax: [phone] Website: [email or web address]

## 2024-02-04 DIAGNOSIS — R269 Unspecified abnormalities of gait and mobility: Secondary | ICD-10-CM | POA: Diagnosis not present

## 2024-02-04 DIAGNOSIS — Z7985 Long-term (current) use of injectable non-insulin antidiabetic drugs: Secondary | ICD-10-CM | POA: Diagnosis not present

## 2024-02-04 DIAGNOSIS — Z7984 Long term (current) use of oral hypoglycemic drugs: Secondary | ICD-10-CM | POA: Diagnosis not present

## 2024-02-04 DIAGNOSIS — Z833 Family history of diabetes mellitus: Secondary | ICD-10-CM | POA: Diagnosis not present

## 2024-02-04 DIAGNOSIS — N182 Chronic kidney disease, stage 2 (mild): Secondary | ICD-10-CM | POA: Diagnosis not present

## 2024-02-04 DIAGNOSIS — E1142 Type 2 diabetes mellitus with diabetic polyneuropathy: Secondary | ICD-10-CM | POA: Diagnosis not present

## 2024-02-04 DIAGNOSIS — Z8249 Family history of ischemic heart disease and other diseases of the circulatory system: Secondary | ICD-10-CM | POA: Diagnosis not present

## 2024-02-04 DIAGNOSIS — E1122 Type 2 diabetes mellitus with diabetic chronic kidney disease: Secondary | ICD-10-CM | POA: Diagnosis not present

## 2024-02-04 DIAGNOSIS — M1990 Unspecified osteoarthritis, unspecified site: Secondary | ICD-10-CM | POA: Diagnosis not present

## 2024-02-04 DIAGNOSIS — Z5986 Financial insecurity: Secondary | ICD-10-CM | POA: Diagnosis not present

## 2024-02-04 DIAGNOSIS — E785 Hyperlipidemia, unspecified: Secondary | ICD-10-CM | POA: Diagnosis not present

## 2024-02-04 DIAGNOSIS — Z008 Encounter for other general examination: Secondary | ICD-10-CM | POA: Diagnosis not present

## 2024-02-04 DIAGNOSIS — Z823 Family history of stroke: Secondary | ICD-10-CM | POA: Diagnosis not present

## 2024-02-12 ENCOUNTER — Other Ambulatory Visit: Payer: Self-pay

## 2024-02-12 NOTE — Progress Notes (Signed)
 02/12/2024 Name: Teresa Spence MRN: [id number] DOB: March 23, 1951  Chief Complaint  Patient presents with   Diabetes Management Plan   Teresa Spence is a 73 y.o. year old female who presented for a telephone visit.   They were referred to the pharmacist by their PCP for assistance in managing medication access.   Subjective:  Care Team: Primary Care Provider: Kita Spence ; Next Scheduled Visit: 8/19  Medication Access/Adherence  Current Pharmacy:  [id number] - [place], [place] - [address] [place] [address] Phone: [phone] Fax: [phone]  [id number] - [address], [place] - [address] [place] [address] Phone: [phone] Fax: [phone]  -Patient reports affordability concerns with their medications: Yes  -Patient reports access/transportation concerns to their pharmacy: No  -Patient reports adherence concerns with their medications:  Yes    Diabetes: Current medications: metformin  1000mg  BID with meals -Medications tried in the past: Farxiga  tried in the past and patient reports significant vaginal yeast infection while taking -Previously taking Ozempic  but has not used in 4+ months due to cost -A1c 7.9% last month -Patient typically uses Libre 3 for CGM but has not worn sensor in a few weeks -PCP would like to retrial SGLT2 therapy for renal protection and A1c benefit  Objective:  Lab Results  Component Value Date   HGBA1C 7.9 (A) 01/21/2024   Lab Results  Component Value Date   CREATININE 0.94 09/17/2023   BUN 25 09/17/2023   NA 139 09/17/2023   K 4.5 09/17/2023   CL 101 09/17/2023   CO2 20 09/17/2023   Medications Reviewed Today     Reviewed by Teresa Spence, RPH (Pharmacist) on 02/12/24 at 1038  Med List Status: <None>   Medication Order Taking? Sig Documenting Provider Last Dose Status Informant  cholecalciferol (VITAMIN D3) 25 MCG (1000 UT)  tablet [id number]  Take 1,000 Units by mouth daily. [provider]  Active   Continuous Blood Gluc Sensor (FREESTYLE LIBRE 3 SENSOR) MISC [id number] No 1 Device by Does not apply route every 14 (fourteen) days.  Patient not taking: Reported on 02/12/2024   Teresa Knight, Spence Not Taking Active   empagliflozin  (JARDIANCE ) 25 MG TABS tablet [id number] No Take 1 tablet (25 mg total) by mouth daily before breakfast.  Patient not taking: Reported on 02/12/2024   Teresa Spence, Teresa Spence Not Taking Active   losartan -hydrochlorothiazide (HYZAAR) 100-25 MG tablet [id number] Yes TAKE 1 TABLET BY MOUTH DAILY Teresa Spence, Teresa Spence Taking Active   metFORMIN  (GLUCOPHAGE ) 1000 MG tablet [id number] Yes TAKE 1 TABLET BY MOUTH TWICE A DAY WITH MEALS Teresa Spence, Teresa Spence Taking Active   rosuvastatin  (CRESTOR ) 10 MG tablet [id number] Yes Take 1 tablet (10 mg total) by mouth daily. Teresa Spence Spence, Spence Taking Active   Semaglutide ,0.25 or 0.5MG /DOS, (OZEMPIC , 0.25 OR 0.5 MG/DOSE,) 2 MG/3ML SOPN [id number] No Inject 0.5 mg into the skin once a week.  Patient not taking: Reported on 02/12/2024   Teresa Knight, Spence Not Taking Active            Assessment/Plan:   Diabetes: -Currently uncontrolled with A1c >7% -Recommend restarting Ozempic  at 0.25mg  weekly x2-4 weeks, then increasing to 0.5mg  weekly.  Patient would qualify for Novo PAP based on [facility]; so I am coordinating with medication assistance team to initiate application process.  -Recommend retrial of SGLT2 therapy with Jardiance  10mg  daily to make sure patient tolerates well  before increasing to 25mg  daily (if needed).  Patient should qualify for BI Cares PAP based on [facility]; so I am coordinating with medication assistance team to initiate application process. -Goal to titrate Ozempic  and Jardiance  as tolerated to be able to stop metformin  -Due for A1c again mid-August  Follow Up Plan: Will monitor progress of PAP to keep patient/provider informed and  schedule follow-up accordingly  Teresa Spence, PharmD, DPLA

## 2024-02-17 ENCOUNTER — Telehealth: Payer: Self-pay

## 2024-02-17 NOTE — Telephone Encounter (Signed)
 PAP: Patient assistance application for Jardiance through [employer]) has been mailed to pt's home address on file. Provider portion of application will be faxed to provider's office.

## 2024-02-18 ENCOUNTER — Telehealth: Payer: Self-pay

## 2024-02-18 NOTE — Telephone Encounter (Signed)
 PAP: Patient assistance application for Ozempic  through [employer] has been mailed to pt's home address on file. Provider portion of application will be faxed to provider's office. E-filed patient portion.

## 2024-02-19 NOTE — Telephone Encounter (Signed)
 Received Provider portion PAP application for Jardiance  BI Cares)

## 2024-02-21 DIAGNOSIS — E1142 Type 2 diabetes mellitus with diabetic polyneuropathy: Secondary | ICD-10-CM | POA: Diagnosis not present

## 2024-02-21 DIAGNOSIS — M25872 Other specified joint disorders, left ankle and foot: Secondary | ICD-10-CM | POA: Diagnosis not present

## 2024-02-21 DIAGNOSIS — M2042 Other hammer toe(s) (acquired), left foot: Secondary | ICD-10-CM | POA: Diagnosis not present

## 2024-02-21 DIAGNOSIS — M2041 Other hammer toe(s) (acquired), right foot: Secondary | ICD-10-CM | POA: Diagnosis not present

## 2024-02-28 NOTE — Telephone Encounter (Signed)
 Re-faxed provider portion to office for signature.

## 2024-03-03 ENCOUNTER — Encounter: Payer: Self-pay | Admitting: Physician Assistant

## 2024-03-03 NOTE — Telephone Encounter (Signed)
 PAP: Application for Ozempic has been submitted to [employer], via fax

## 2024-03-04 ENCOUNTER — Other Ambulatory Visit: Payer: Self-pay | Admitting: Physician Assistant

## 2024-03-04 DIAGNOSIS — E11649 Type 2 diabetes mellitus with hypoglycemia without coma: Secondary | ICD-10-CM

## 2024-03-04 MED ORDER — CELECOXIB 200 MG PO CAPS: 200.0000 mg | ORAL_CAPSULE | Freq: Two times a day (BID) | ORAL | 2 refills | Status: DC

## 2024-03-05 NOTE — Progress Notes (Signed)
 Pharmacy Medication Assistance Program Note    03/05/2024  Patient ID: Teresa Spence, female   DOB: 1951/02/26, 73 y.o.   MRN: [id number]     03/05/2024  Outreach Medication One  Initial Outreach Date (Medication One) 02/18/2024  Manufacturer Medication One Novo Nordisk  [facility] Ozempic   Type of Assistance Manufacturer Assistance  Date Application Sent to Patient 02/18/2024  Application Items Requested Application  Date Application Sent to Prescriber 02/18/2024  Name of Prescriber Vermell Bologna  Date Application Received From Patient 02/18/2024  Application Items Received From Patient Application  Date Application Received From Provider 03/03/2024  Date Application Submitted to Manufacturer 03/03/2024  Method Application Sent to Manufacturer Fax  Patient Assistance Determination Approved  Approval Start Date 03/04/2024  Approval End Date 09/09/2024  Patient Notification Method Telephone Call  Telephone Call Outcome Successful

## 2024-03-05 NOTE — Telephone Encounter (Signed)
 PAP: Application for Jardiance  has been submitted to [employer]), via fax With income tax document.

## 2024-03-05 NOTE — Telephone Encounter (Signed)
 PAP: Patient assistance application for Ozempic  has been approved by PAP Companies: [employer] from 03/04/2024 to 09/09/2024. Medication should be delivered to PAP Delivery: Provider's office. For further shipping updates, please contact [employer] at [phone]. Patient ID is: [id number]

## 2024-03-12 NOTE — Telephone Encounter (Signed)
 PAP: Patient assistance application for Jardiance  has been approved by PAP Companies: [employer] from 03/11/2024 to 09/09/2024. Medication should be delivered to PAP Delivery: Home. For further shipping updates, please contact [employer] (BI Cares) at [phone]. Patient ID is: not provided Allow 7-10 business days for medication to be delivered.

## 2024-03-12 NOTE — Progress Notes (Signed)
 Pharmacy Medication Assistance Program Note    03/12/2024  Patient ID: Teresa Spence, female  DOB: [birth year], 73 y.o.  MRN:  [id number]     03/12/2024  Outreach Medication Two  Initial Outreach Date (Medication Two) 02/17/2024  Manufacturer Medication Two [employer]  [employer] Drugs Jardiance   Dose of Jardiance  10 mg  Type of Radiographer, therapeutic Assistance  Date Application Sent to Patient 02/17/2024  Application Items Requested Application;Proof of Income  Date Application Sent to Prescriber 02/17/2024  Name of Prescriber Vermell Bologna  Date Application Received From Patient 03/04/2024  Application Items Received From Patient Application;Proof of Income  Date Application Received From Provider 02/19/2024  Method Application Sent to Manufacturer Fax  Date Application Submitted to Manufacturer 03/06/2024  Patient Assistance Determination Approved  Approval Start Date 03/11/2024  Patient Notification Method MyChart

## 2024-03-24 ENCOUNTER — Other Ambulatory Visit: Payer: Self-pay

## 2024-03-24 NOTE — Progress Notes (Signed)
   03/24/2024  Patient ID: Teresa Spence, female   DOB: June 19, 1951, 73 y.o.   MRN: [id number]  Subjective/Objective Telephone visit to follow-up on management of diabetes  Diabetes: Current medications: metformin  1000mg  BID with meals -Medications tried in the past: Farxiga  tried in the past and patient reports significant vaginal yeast infection while taking -Previously taking Ozempic  but has not used in 4+ months due to cost -Recently approved for BI Cares PAP for Jardiance  10mg  and has received medication but not yet started due to concern with possible side effect of dizziness -Approved for Novo PAP for Ozempic  0.25/0.5mg  weekly, but medication has not yet arrived at [facility] -A1c 7.9% last month -Patient typically uses Libre 3 for CGM but has not worn sensor in a few weeks due to false lows at night from sleeping on area where sensor is.  Patient does have 2 more sensors at home she can use.   Assessment/Plan:    Diabetes: -Currently uncontrolled with A1c >7% -Recommend restarting Ozempic  at 0.25mg  weekly x2-4 weeks, then increasing to 0.5mg  weekly.  Tomorrow will be the 14th business day after patient was approved to receive this medication; so if it has not arrived, I will contact Nov[employer] to check on processing/shipping status. -Advised patient to go ahead and start Jardiance  10mg  daily taking 1st thing in the morning on a day she does not have to leave the house for something until later in the afternoon. -Continue metformin  1000mg  BID at this time -Goal to titrate Ozempic  and/or Jardiance  as tolerated to be able to stop metformin  -Patient will resume CGM with Libre 3+ and adjust alerts to avoid being notified of false lows during the night, as it is unlikely medication regimen will cause hypoglycemia.  If this does not work out well, we could consider Dexcom G7 since sensors can be placed on abdomen. -Due for A1c again mid-August   Follow Up Plan: Will follow-up with patient tomorrow  regarding estimated arrival of Ozempic  and schedule follow-up at that time   Teresa Spence, PharmD, DPLA

## 2024-03-25 ENCOUNTER — Telehealth: Payer: Self-pay

## 2024-03-25 NOTE — Telephone Encounter (Signed)
 Spoke with patient. Informed that we had received the Ozempic  prescription  0.25/0.5mg  from patient assistance  x  4 pens. Place in patient refrigerator. She states she will pick this up this afternoon.

## 2024-04-08 ENCOUNTER — Telehealth: Payer: Self-pay

## 2024-04-08 NOTE — Progress Notes (Signed)
   04/08/2024  Patient ID: Teresa Spence, female   DOB: 09/05/1951, 73 y.o.   MRN: [id number]  Outreach attempt to follow-up with patient regarding control of diabetes.  I was not able to reach the patient, but I did leave a HPAA compliant voicemail with my direct phone number and am also sending a MyChart message.  Channing DELENA Mealing, PharmD, DPLA

## 2024-04-17 ENCOUNTER — Other Ambulatory Visit

## 2024-04-17 DIAGNOSIS — E1142 Type 2 diabetes mellitus with diabetic polyneuropathy: Secondary | ICD-10-CM

## 2024-04-17 NOTE — Progress Notes (Signed)
   04/17/2024  Patient ID: Teresa Spence, female   DOB: 02-06-1951, 73 y.o.   MRN: [id number]  Subjective/Objective Telephone visit to follow-up on management of diabetes  Diabetes: Current medications: metformin  1000mg  BID with meals, Jardiance  10mg  daily, Ozempic  0.25mg  weekly -Medications tried in the past: Farxiga  tried in the past and patient reports significant vaginal yeast infection while taking -Recently started Jardiance  10mg  daily and Ozempic  0.25mg  weekly - has had 2 doses of Ozempic  and will take third dose later today.  Does endorse few occurrences of diarrhea, which could be related to Ozempic . -Receives Jardiance  through BI Cares PAP and Ozempic  through Novo PAP (recently received 3 pens) -A1c 7.9% most recently -Patient typically uses Libre 3 for CGM but has not worn sensor in a few weeks due to false lows at night from sleeping on area where sensor is.  Patient does have 2 more sensors at home she can use. -Has not used finger sticks to check home BG, but does have testing supplies available to do so  Assessment/Plan:    Diabetes: -Currently uncontrolled with A1c >7% -Continue metformin  1000mg  BID, Jardiance  10mg  daily, and Ozempic - increase to 0.5mg  weekly in 2 weeks -Goal to titrate Ozempic  and/or Jardiance  as tolerated to be able to stop metformin  -Consider resuming CGM with Libre 3+ and adjust alerts to avoid being notified of false lows during the night, as it is unlikely medication regimen will cause hypoglycemia.  If this does not work out well, go back to fingersticks checking FBG at least 3x/week -Sees PCP again 8/19 and will be due for A1c   Follow Up Plan: 5 weeks   Teresa Spence, PharmD, DPLA

## 2024-04-28 ENCOUNTER — Ambulatory Visit: Admitting: Physician Assistant

## 2024-05-12 ENCOUNTER — Ambulatory Visit: Admitting: Physician Assistant

## 2024-05-22 ENCOUNTER — Other Ambulatory Visit: Payer: Self-pay

## 2024-05-22 NOTE — Progress Notes (Signed)
   05/22/2024  Patient ID: Teresa Spence, female   DOB: [birth year], 73 y.o.   MRN: [id number]  Subjective/Objective Telephone visit to follow-up on management of diabetes  Diabetes: Current medications: metformin  1000mg  BID with meals, Jardiance  10mg  daily, Ozempic  0.25mg  weekly -Medications tried in the past: Farxiga  tried in the past and patient reports significant vaginal yeast infection while taking -Recently started Jardiance  10mg  daily and Ozempic  0.25mg  weekly  -Patient has been experiencing heart burn and acid reflux in the evenings before bed; this does not occur every day, and Tums will relieve.  She has also recently started celecoxib , but is taking it with food and states she has tolerated this medication okay in the past. -Patient will sometimes forget Jardiance  in the morning -Receives Jardiance  through [facility] Cares PAP and Ozempic  through Novo PAP (recently received 3 pens) -A1c 7.9% most recently -Patient typically uses Libre 3 for CGM but has not worn sensor in a few weeks due to false lows at night from sleeping on area where sensor is.  Patient does have 2 more sensors at home she can use. -Has not used finger sticks to check home BG, but does have testing supplies available to do so  Hypertension: Current medications:  losartan /hydrochlorothiazide 100/25mg  daily -Last OV BP was elevated at 144/81 on 5/13  Hyperlipidemia Current medications:  rosuvastatin  10mg  daily -Last lipid panel approximately 1 year ago reflected elevated LDL of 94 and TG of 268  Assessment/Plan:    Diabetes: -Currently uncontrolled with A1c >7% -Continue metformin  1000mg  BID, Jardiance  10mg  daily, and Ozempic  0.25mg  weekly -Advised to take Jardiance  when remembered if lunch time or before; I do not recommend taking later in the day due to risk of increased urination during the night -Start taking famotidine 40mg  before suppertime to see if this will prevent heartburn/reflux -Patient sees PCP again  9/23 and will be due for A1c; if heartburn/reflux has improved, I recommend increasing Ozempic  to 0.5mg  weekly -Goal to titrate Ozempic  and/or Jardiance  as tolerated to be able to stop metformin  -Continue to monitor and record home BG  Hypertension: -Continue current regimen until follow-up with PCP -If BP consistently >130/80, consider addition of amlodipine  5mg  daily.  Increasing Jardiance  to 25mg  when able may also improve BP.  Hyperlipidemia: -Continue current regimen -I recommend lipid panel at next visit; if LDL still >70, consider increasing rosuvastatin  to 20mg  daily   Follow Up Plan: 6 weeks   Channing DELENA Mealing, PharmD, DPLA

## 2024-06-02 ENCOUNTER — Ambulatory Visit (INDEPENDENT_AMBULATORY_CARE_PROVIDER_SITE_OTHER): Admitting: Physician Assistant

## 2024-06-02 ENCOUNTER — Encounter: Payer: Self-pay | Admitting: Physician Assistant

## 2024-06-02 VITALS — BP 125/74 | HR 75 | Ht 65.0 in | Wt 181.0 lb

## 2024-06-02 DIAGNOSIS — E559 Vitamin D deficiency, unspecified: Secondary | ICD-10-CM | POA: Diagnosis not present

## 2024-06-02 DIAGNOSIS — E785 Hyperlipidemia, unspecified: Secondary | ICD-10-CM | POA: Diagnosis not present

## 2024-06-02 DIAGNOSIS — E1142 Type 2 diabetes mellitus with diabetic polyneuropathy: Secondary | ICD-10-CM | POA: Diagnosis not present

## 2024-06-02 DIAGNOSIS — Z7984 Long term (current) use of oral hypoglycemic drugs: Secondary | ICD-10-CM

## 2024-06-02 DIAGNOSIS — Z7985 Long-term (current) use of injectable non-insulin antidiabetic drugs: Secondary | ICD-10-CM

## 2024-06-02 DIAGNOSIS — R809 Proteinuria, unspecified: Secondary | ICD-10-CM | POA: Diagnosis not present

## 2024-06-02 DIAGNOSIS — I10 Essential (primary) hypertension: Secondary | ICD-10-CM | POA: Diagnosis not present

## 2024-06-02 DIAGNOSIS — E11649 Type 2 diabetes mellitus with hypoglycemia without coma: Secondary | ICD-10-CM | POA: Diagnosis not present

## 2024-06-02 DIAGNOSIS — M545 Low back pain, unspecified: Secondary | ICD-10-CM | POA: Insufficient documentation

## 2024-06-02 LAB — POCT GLYCOSYLATED HEMOGLOBIN (HGB A1C): Hemoglobin A1C: 7.5 % — AB (ref 4.0–5.6)

## 2024-06-02 LAB — POCT UA - MICROALBUMIN: Microalbumin Ur, POC: 80 mg/L

## 2024-06-02 MED ORDER — LOSARTAN POTASSIUM-HCTZ 100-25 MG PO TABS: 1.0000 | ORAL_TABLET | Freq: Every day | ORAL | 1 refills | Status: AC

## 2024-06-02 MED ORDER — EMPAGLIFLOZIN 10 MG PO TABS
10.0000 mg | ORAL_TABLET | Freq: Every day | ORAL | 3 refills | Status: DC
Start: 2024-06-02 — End: 2024-07-03

## 2024-06-02 MED ORDER — OZEMPIC (0.25 OR 0.5 MG/DOSE) 2 MG/3ML ~~LOC~~ SOPN: 0.5000 mg | PEN_INJECTOR | SUBCUTANEOUS | 0 refills | Status: DC

## 2024-06-02 NOTE — Progress Notes (Unsigned)
   Established Patient Office Visit  Subjective   Patient ID: Teresa Spence, female    DOB: Jan 10, 1951  Age: 73 y.o. MRN: [id number]  No chief complaint on file.   HPI Patient is a 73 y/o female with PMH of T2DM, microalbuminuria, and HTN who is here for a routine 3 month follow up.  Pt is not checking her sugars. She has a CGM but is not wearing it. She is compliant on metformin , Ozempic , and Jardiance . She is experiencing some GI upset and acid reflux on the 0.25 dose of Ozempic , but she has not tried taking any acid reducers for relief. She has a history of yeast infections when she took Farxiga , but states she has not had the same problems with Jardiance . She is walking about 3 days a week, but states she is going to try to start walking more. She states her diet is fine but not as good as it should be. She avoids sugary drinks, but eats a lot of fruit.   Denies any CP, headaches, or vision changes. Positive for 2 episodes of hypoglycemia. She did not check her blood sugar during the events, but had symptoms of hypoglycemia. Both episodes occurred when she didn't eat dinner.  She also mentioned she has an ortho appointment scheduled because she has had pain in her right glute, knee, and shin. Denies pain radiating down the back of her leg. Pain is relived with leaning forward. She also has numbness that travels down her entire left leg when she is standing for too long. She is hoping to start physical therapy after seeing ortho.    {History (Optional):23778}  ROS    Objective:     There were no vitals taken for this visit. {Vitals History (Optional):23777}  Physical Exam   No results found for any visits on 06/02/24.  {Labs (Optional):23779}  The 10-year ASCVD risk score (Arnett DK, et al., 2019) is: 40.1%    Assessment & Plan:   Problem List Items Addressed This Visit   None Visit Diagnoses       Uncontrolled type 2 diabetes mellitus with hypoglycemia, unspecified  hypoglycemia coma status (HCC)    -  Primary       No follow-ups on file.    [address] [place], Student-PA

## 2024-06-02 NOTE — Patient Instructions (Addendum)
 Increase celebrex  to 200mg  twice a day.  Increase ozempic  .5mg  weekly.  Get labs today.  Will order physical therapy

## 2024-06-03 ENCOUNTER — Ambulatory Visit: Payer: Self-pay | Admitting: Physician Assistant

## 2024-06-03 ENCOUNTER — Encounter: Payer: Self-pay | Admitting: Physician Assistant

## 2024-06-03 LAB — CMP14+EGFR
ALT: 16 IU/L (ref 0–32)
AST: 19 IU/L (ref 0–40)
Albumin: 4.4 g/dL (ref 3.8–4.8)
Alkaline Phosphatase: 67 IU/L (ref 49–135)
BUN/Creatinine Ratio: 23 (ref 12–28)
BUN: 26 mg/dL (ref 8–27)
Bilirubin Total: 0.5 mg/dL (ref 0.0–1.2)
CO2: 21 mmol/L (ref 20–29)
Calcium: 9.7 mg/dL (ref 8.7–10.3)
Chloride: 101 mmol/L (ref 96–106)
Creatinine, Ser: 1.12 mg/dL — ABNORMAL HIGH (ref 0.57–1.00)
Globulin, Total: 2.3 g/dL (ref 1.5–4.5)
Glucose: 142 mg/dL — ABNORMAL HIGH (ref 70–99)
Potassium: 4.1 mmol/L (ref 3.5–5.2)
Sodium: 138 mmol/L (ref 134–144)
Total Protein: 6.7 g/dL (ref 6.0–8.5)
eGFR: 52 mL/min/1.73 — ABNORMAL LOW (ref >59)

## 2024-06-03 LAB — TSH+FREE T4
Free T4: 1.19 ng/dL (ref 0.82–1.77)
TSH: 2.2 u[IU]/mL (ref 0.450–4.500)

## 2024-06-03 LAB — LIPID PANEL
Chol/HDL Ratio: 6.2 ratio — ABNORMAL HIGH (ref 0.0–4.4)
Cholesterol, Total: 197 mg/dL (ref 100–199)
HDL: 32 mg/dL — ABNORMAL LOW (ref >39)
LDL Chol Calc (NIH): 113 mg/dL — ABNORMAL HIGH (ref 0–99)
Triglycerides: 296 mg/dL — ABNORMAL HIGH (ref 0–149)
VLDL Cholesterol Cal: 52 mg/dL — ABNORMAL HIGH (ref 5–40)

## 2024-06-03 LAB — VITAMIN D 25 HYDROXY (VIT D DEFICIENCY, FRACTURES): Vit D, 25-Hydroxy: 27.3 ng/mL — ABNORMAL LOW (ref 30.0–100.0)

## 2024-06-03 NOTE — Progress Notes (Signed)
 Jillene,   Thyroid  looks good.  Vitamin D  is low. Increase to 2000 units daily and take with K2 and/or dairy to help with absorption.  Cholesterol is not to goal. We need to increase crestor  to 20mg . Is that ok?  Kidney function is down some and on the dry side. Increase hydration and recheck in 1 month.

## 2024-06-09 ENCOUNTER — Telehealth: Payer: Self-pay

## 2024-06-09 NOTE — Telephone Encounter (Signed)
 Completed refill order form for Ozempic  and faxed to Provider's office for signature and review.

## 2024-06-15 NOTE — Therapy (Unsigned)
 OUTPATIENT PHYSICAL THERAPY THORACOLUMBAR EVALUATION   Patient Name: Teresa Spence MRN: [id number] DOB:[birth year], 73 y.o., female Today's Date: 06/17/2024  END OF SESSION:  PT End of Session - 06/17/24 1253     Visit Number 1    Number of Visits 16    Date for Recertification  08/12/24    Authorization Type [employer]na medicare copay $10 no auth required    Progress Note Due on Visit 10    PT Start Time 0845    PT Stop Time 0930    PT Time Calculation (min) 45 min    Activity Tolerance Patient tolerated treatment well          Past Medical History:  Diagnosis Date   Allergy 1975   Environmental   Arthritis 1985   Bilateral carpal tunnel syndrome 01/20/2020   Diabetes mellitus without complication (HCC)    Heart murmur 105w   Hypertension    Past Surgical History:  Procedure Laterality Date   BUNIONECTOMY Right 06/10/2019   HAMMER TOE SURGERY     JOINT REPLACEMENT  2015   MEDIAL PARTIAL KNEE REPLACEMENT     TONSILLECTOMY     Patient Active Problem List   Diagnosis Date Noted   Acute right-sided low back pain without sciatica 06/02/2024   Vitamin D  deficiency 06/02/2024   Uncontrolled type 2 diabetes mellitus with hypoglycemia (HCC) 06/02/2024   Microalbuminuria 06/12/2023   Pain in both thighs 02/26/2023   Plantar callus 11/23/2022   Left hip pain 11/23/2022   Type 2 diabetes mellitus with diabetic polyneuropathy, without long-term current use of insulin (HCC) 08/28/2021   CKD (chronic kidney disease) stage 2, GFR 60-89 ml/min 08/28/2021   Toe ulcer, left, with necrosis of muscle (HCC) 08/24/2020   Open toe wound, sequela 08/23/2020   Hyperlipidemia LDL goal <70 08/23/2020   Bilateral carpal tunnel syndrome 01/20/2020   DDD (degenerative disc disease), cervical 12/01/2019   Fine motor skill loss 12/01/2019   Tinnitus, left 09/14/2019   ETD (Eustachian tube dysfunction), bilateral 09/14/2019   Neuropathy 06/03/2019   Post-menopausal 06/03/2019   Generalized  osteoarthritis of hand 09/11/2018   Colon polyps 05/21/2018   Hyperlipidemia associated with type 2 diabetes mellitus (HCC) 05/06/2018   Seborrheic keratoses 05/06/2018   Dermoid cyst of right ear 05/06/2018   Chest tightness 05/06/2018   Class 1 obesity due to excess calories with serious comorbidity and body mass index (BMI) of 30.0 to 30.9 in adult 11/05/2017   Toenail fungus 10/25/2017   Numbness and tingling in both hands 05/08/2017   Primary osteoarthritis involving multiple joints 05/08/2017   Cellulitis of toe of left foot 12/29/2016   Absent pedal pulses 12/29/2016   Essential hypertension 12/27/2016   Uncontrolled type II diabetes with stage 2 chronic kidney disease 12/27/2016    PCP: Vermell LITTIE Bologna; PA-C  REFERRING PROVIDER: Vermell LITTIE Bologna; PA-C  REFERRING DIAG: Acute R sided LBP   Rationale for Evaluation and Treatment: Rehabilitation  THERAPY DIAG:  Pain of right hip joint  Muscle weakness (generalized)  Other abnormalities of gait and mobility  Other symptoms and signs involving the musculoskeletal system  Other low back pain  ONSET DATE: 10/12/23  SUBJECTIVE:  SUBJECTIVE STATEMENT: Patient has had increased pain in the R hip and knee since February, 2025 with no known injury. Patient reports that she went to [place] in the spring and she did a lot of walking including stairs. She walked every day for 9 days. She has pain in the R hip and R knee. She braced the knee and use lidocaine on the hip. Pain is still there in the R hip and knee in the morning and decreases in the day and by the end of the day. She has no pain at the end of the day. She has had leg length difference since childhood.   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 Uncontrolled type 2 DM with polyneuropathy; HTN; hyperlipidemia; vitamin D  deficiency; R sided LBP without sciatica   PAIN:  Are you having pain? Yes: NPRS scale: 3/10; worst in past week 9/10; best in past week 0/10 Pain location: R hip; R knee  Pain description: sharp  Aggravating factors: morning  Relieving factors: moving; stretching; heat; lidocaine roller; better as the day goes by   PRECAUTIONS: None  RED FLAGS: None   WEIGHT BEARING RESTRICTIONS: No  FALLS:  Has patient fallen in last 6 months? No  LIVING ENVIRONMENT: Lives with: lives alone  Lives in: House/apartment Stairs: Yes: External: 1 steps; none; second floor but seldom goes up to second floor  Has following equipment at home: walk in shower  OCCUPATION: retired for corporate accounting retired ~ 5 yrs ago; takes classes and is involved with various groups; sedentary; sits in soft sofa when at home - active with art   PLOF: Independent  PATIENT GOALS: decrease pain and walk with less pain   NEXT MD VISIT: 09/01/24  OBJECTIVE:  Note: Objective measures were completed at Evaluation unless otherwise noted.  DIAGNOSTIC FINDINGS:  No diagnostic test for lumbar spine  PATIENT SURVEYS:  Modified Oswestry: 20/50; 40%   Interpretation of scores: Score Category Description  0-20% Minimal Disability The patient can cope with most living activities. Usually no treatment is indicated apart from advice on lifting, sitting and exercise  21-40% Moderate Disability The patient experiences more pain and difficulty with sitting, lifting and standing. Travel and social life are more difficult and they may be disabled from work. Personal care, sexual activity and sleeping are not grossly affected, and the patient can usually be managed by conservative means  41-60% Severe Disability Pain remains the main problem in this group, but activities of daily living are affected. These patients require a detailed  investigation  61-80% Crippled Back pain impinges on all aspects of the patient's life. Positive intervention is required  81-100% Bed-bound  These patients are either bed-bound or exaggerating their symptoms  Bluford FORBES Zoe DELENA Karon DELENA, et al. Surgery versus conservative management of stable thoracolumbar fracture: the PRESTO feasibility RCT. [place] ([place]): [employer]; 2021 Nov. [facility] Technology Assessment, No. 25.62.) Appendix 3, Oswestry Disability Index category descriptors. Available from: FindJewel[employer].cz  Minimally Clinically Important Difference (MCID) = 12.8%  COGNITION: Overall cognitive status: Within functional limits for tasks assessed     SENSATION: L leg will go numb when walking on an intermittent basis - not consistent; resolves with bending forward touching ground - happens about every 2 weeks   MUSCLE LENGTH: Hamstrings: Right 75 deg; Left 70 deg Thomas test: Right tight  > Left   POSTURE: rounded shoulders, forward head, flexed trunk , and weight shift  left; R LE in hip and knee flexion; R calf atrophied   PALPATION: Tight and tender R iliacus, psoas compared to L; muscular tightness R lower lumbar/SI area; R posterior hip through the piriformis and glut med/min/obturator   LUMBAR ROM:   AROM eval  Flexion 90% wide based stance   Extension 25%  Right lateral flexion 70%  Left lateral flexion 80%  Right rotation 60%  Left rotation 45%   (Blank rows = not tested)  LOWER EXTREMITY MMT:      MMT  Right eval Left eval  Hip flexion 4 5  Hip extension 4- 4-  Hip abduction 4 4+  Hip adduction    Hip internal rotation    Hip external rotation    Knee flexion 5 5  Knee extension 5 5  Ankle dorsiflexion    Ankle plantarflexion    Ankle inversion    Ankle eversion     (Blank rows = not tested)  LOWER EXTREMITY ROM: limited end range hip extension; rotation R > L    Right eval Left eval  Hip flexion     Hip extension    Hip abduction    Hip adduction    Hip internal rotation    Hip external rotation    Knee flexion    Knee extension    Ankle dorsiflexion    Ankle plantarflexion    Ankle inversion    Ankle eversion     (Blank rows = not tested)  LUMBAR SPECIAL TESTS:  Straight leg raise test: Negative and Slump test: Negative  FUNCTIONAL TESTS:  5 times sit to stand: 10.53 sec  SLS R 10 sec some instability: L 5 sec instability  GAIT: Distance walked: 40 feet  Assistive device utilized: None Level of assistance: Complete Independence Comments: abnormal gait pattern; Trendelenburg gait   TREATMENT DATE: 06/17/24 - eval findings; POC; HEP                                                                                                                                  PATIENT EDUCATION:  Education details: POC; HEP  Person educated: Patient Education method: Explanation, Demonstration, Tactile cues, Verbal cues, and Handouts Education comprehension: verbalized understanding, returned demonstration, verbal cues required, tactile cues required, and needs further education  HOME EXERCISE PROGRAM: Access Code: [email or web address] URL: [email or web address] Date: 06/17/2024 Prepared by: Nicolaas Savo  Exercises - Supine Piriformis Stretch with Leg Straight  - 2 x daily - 7 x weekly - 1 sets - 3 reps - 30 sec  hold - Hooklying Clamshell with Resistance  - 1 x daily - 7 x weekly - 1 sets - 3 reps - 30 sec  hold - Supine Bridge with Resistance Band  - 2 x daily - 7 x weekly - 1-2 sets - 10 reps - 5-10 sec  hold  ASSESSMENT:  CLINICAL IMPRESSION: Patient is a 73 y.o. female who was seen today for  physical therapy evaluation and treatment for acute R sided LBP. 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 Patient will benefit from PT to address problems identified.   OBJECTIVE IMPAIRMENTS: Abnormal gait, decreased activity tolerance, decreased balance, decreased mobility, decreased ROM, decreased strength, increased fascial restrictions, improper body mechanics, postural dysfunction, and pain.   ACTIVITY LIMITATIONS: carrying, lifting, bending, sitting, standing, squatting, sleeping, stairs, transfers, and locomotion level  PARTICIPATION LIMITATIONS: cleaning, driving, shopping, and community activity  PERSONAL FACTORS: Fitness, Past/current experiences, Time since onset of injury/illness/exacerbation, and comorbidities as noted above are also affecting patient's functional outcome.   REHAB POTENTIAL: Good  CLINICAL DECISION MAKING: Evolving/moderate complexity  EVALUATION COMPLEXITY: Moderate   GOALS: Goals reviewed with patient? Yes  SHORT TERM GOALS: Target date: 07/15/2024   Independent in initial HEP  Baseline: Goal status: INITIAL  2.  Patient demonstrates proper transfers sit to sidelying to supine and reverse to decrease irritation through the LB/R hip  Baseline:  Goal status: INITIAL  3.  Patient reports performing consistent HEP on a daily basis at least 5 days/week Baseline:  Goal status: INITIAL   LONG TERM GOALS: Target date: 08/12/2024    Decrease pain in LB and R hip by 50-75% allowing patient to participate in normal functional and recreational activities  Baseline:  Goal status: INITIAL  2.  Patient reports awakening with LB and R hip pain no greater than 2/10 in the morning Baseline:  Goal status: INITIAL  3.  4+/5 to 5/5 strength bilat LE's  Baseline:  Goal status: INITIAL  4.  Patient reports ability to walk for 15-20 min with minimal to no increase in baseline back and hip pain  Baseline:  Goal status: INITIAL  5.  Patient demonstrates improvement in modified oswestry score by 15%  Baseline: eval -  Modified Oswestry: 20/50; 40%  Goal status: INITIAL  6.  Independent in advance HEP including aquatic therapy as indicated  Baseline:  Goal status: INITIAL  PLAN:  PT FREQUENCY: 2x/week  PT DURATION: 8 weeks  PLANNED INTERVENTIONS: 97164- PT Re-evaluation, 97110-Therapeutic exercises, 97530- Therapeutic activity, [id number]- Neuromuscular re-education, 97535- Self Care, [id number]- Manual therapy, [phone]- Aquatic Therapy, Patient/Family education, Balance training, Taping, and Joint mobilization.  PLAN FOR NEXT SESSION: review and progress with exercise program; continue back care and ergonomic education; manual work and modalities as indicated    Zaylynn Rickett P Odell Fasching, PT 06/17/2024, 12:56 PM

## 2024-06-17 ENCOUNTER — Encounter: Payer: Self-pay | Admitting: Rehabilitative and Restorative Service Providers"

## 2024-06-17 ENCOUNTER — Other Ambulatory Visit: Payer: Self-pay

## 2024-06-17 ENCOUNTER — Ambulatory Visit: Attending: Physician Assistant | Admitting: Rehabilitative and Restorative Service Providers"

## 2024-06-17 DIAGNOSIS — M25551 Pain in right hip: Secondary | ICD-10-CM | POA: Insufficient documentation

## 2024-06-17 DIAGNOSIS — M25552 Pain in left hip: Secondary | ICD-10-CM | POA: Diagnosis not present

## 2024-06-17 DIAGNOSIS — M6281 Muscle weakness (generalized): Secondary | ICD-10-CM | POA: Diagnosis not present

## 2024-06-17 DIAGNOSIS — M5459 Other low back pain: Secondary | ICD-10-CM | POA: Insufficient documentation

## 2024-06-17 DIAGNOSIS — M545 Low back pain, unspecified: Secondary | ICD-10-CM | POA: Diagnosis not present

## 2024-06-17 DIAGNOSIS — R2689 Other abnormalities of gait and mobility: Secondary | ICD-10-CM | POA: Insufficient documentation

## 2024-06-17 DIAGNOSIS — R29898 Other symptoms and signs involving the musculoskeletal system: Secondary | ICD-10-CM | POA: Diagnosis not present

## 2024-06-18 ENCOUNTER — Encounter

## 2024-06-18 NOTE — Therapy (Unsigned)
 OUTPATIENT PHYSICAL THERAPY THORACOLUMBAR TREATMENT   Patient Name: Teresa Spence MRN: [id number] DOB:12-Nov-1950, 73 y.o., female Today's Date: 06/18/2024  END OF SESSION:    Past Medical History:  Diagnosis Date   Allergy 1975   Environmental   Arthritis 1985   Bilateral carpal tunnel syndrome 01/20/2020   Diabetes mellitus without complication (HCC)    Heart murmur 105w   Hypertension    Past Surgical History:  Procedure Laterality Date   BUNIONECTOMY Right 06/10/2019   HAMMER TOE SURGERY     JOINT REPLACEMENT  2015   MEDIAL PARTIAL KNEE REPLACEMENT     TONSILLECTOMY     Patient Active Problem List   Diagnosis Date Noted   Acute right-sided low back pain without sciatica 06/02/2024   Vitamin D  deficiency 06/02/2024   Uncontrolled type 2 diabetes mellitus with hypoglycemia (HCC) 06/02/2024   Microalbuminuria 06/12/2023   Pain in both thighs 02/26/2023   Plantar callus 11/23/2022   Left hip pain 11/23/2022   Type 2 diabetes mellitus with diabetic polyneuropathy, without long-term current use of insulin (HCC) 08/28/2021   CKD (chronic kidney disease) stage 2, GFR 60-89 ml/min 08/28/2021   Toe ulcer, left, with necrosis of muscle (HCC) 08/24/2020   Open toe wound, sequela 08/23/2020   Hyperlipidemia LDL goal <70 08/23/2020   Bilateral carpal tunnel syndrome 01/20/2020   DDD (degenerative disc disease), cervical 12/01/2019   Fine motor skill loss 12/01/2019   Tinnitus, left 09/14/2019   ETD (Eustachian tube dysfunction), bilateral 09/14/2019   Neuropathy 06/03/2019   Post-menopausal 06/03/2019   Generalized osteoarthritis of hand 09/11/2018   Colon polyps 05/21/2018   Hyperlipidemia associated with type 2 diabetes mellitus (HCC) 05/06/2018   Seborrheic keratoses 05/06/2018   Dermoid cyst of right ear 05/06/2018   Chest tightness 05/06/2018   Class 1 obesity due to excess calories with serious comorbidity and body mass index (BMI) of 30.0 to 30.9 in adult 11/05/2017    Toenail fungus 10/25/2017   Numbness and tingling in both hands 05/08/2017   Primary osteoarthritis involving multiple joints 05/08/2017   Cellulitis of toe of left foot 12/29/2016   Absent pedal pulses 12/29/2016   Essential hypertension 12/27/2016   Uncontrolled type II diabetes with stage 2 chronic kidney disease 12/27/2016    PCP: Vermell LITTIE Bologna; PA-C  REFERRING PROVIDER: Vermell LITTIE Bologna; PA-C  REFERRING DIAG: Acute R sided LBP   Rationale for Evaluation and Treatment: Rehabilitation  THERAPY DIAG:  No diagnosis found.  ONSET DATE: 10/12/23  SUBJECTIVE:  SUBJECTIVE STATEMENT: Patient has had increased pain in the R hip and knee since February, 2025 with no known injury. Patient reports that she went to [place] in the spring and she did a lot of walking including stairs. She walked every day for 9 days. She has pain in the R hip and R knee. She braced the knee and use lidocaine on the hip. Pain is still there in the R hip and knee in the morning and decreases in the day and by the end of the day. She has no pain at the end of the day. She has had leg length difference since childhood.   PERTINENT HISTORY:  Leg length difference since childhood R LE longer about an inch compared to L. Uncontrolled type 2 DM with polyneuropathy; HTN; hyperlipidemia; vitamin D  deficiency; R sided LBP without sciatica   PAIN:  Are you having pain? Yes: NPRS scale: 3/10; worst in past week 9/10; best in past week 0/10 Pain location: R hip; R knee  Pain description: sharp  Aggravating factors: morning  Relieving factors: moving; stretching; heat; lidocaine roller; better as the day goes by   PRECAUTIONS: None  RED FLAGS: None   WEIGHT BEARING RESTRICTIONS: No  FALLS:  Has patient fallen in last 6 months?  No  LIVING ENVIRONMENT: Lives with: lives alone  Lives in: House/apartment Stairs: Yes: External: 1 steps; none; second floor but seldom goes up to second floor  Has following equipment at home: walk in shower  OCCUPATION: retired for corporate accounting retired ~ 5 yrs ago; takes classes and is involved with various groups; sedentary; sits in soft sofa when at home - active with art   PLOF: Independent  PATIENT GOALS: decrease pain and walk with less pain   NEXT MD VISIT: 09/01/24  OBJECTIVE:  Note: Objective measures were completed at Evaluation unless otherwise noted.  DIAGNOSTIC FINDINGS:  No diagnostic test for lumbar spine  PATIENT SURVEYS:  Modified Oswestry: 20/50; 40%   Interpretation of scores: Score Category Description  0-20% Minimal Disability The patient can cope with most living activities. Usually no treatment is indicated apart from advice on lifting, sitting and exercise  21-40% Moderate Disability The patient experiences more pain and difficulty with sitting, lifting and standing. Travel and social life are more difficult and they may be disabled from work. Personal care, sexual activity and sleeping are not grossly affected, and the patient can usually be managed by conservative means  41-60% Severe Disability Pain remains the main problem in this group, but activities of daily living are affected. These patients require a detailed investigation  61-80% Crippled Back pain impinges on all aspects of the patient's life. Positive intervention is required  81-100% Bed-bound  These patients are either bed-bound or exaggerating their symptoms  Bluford FORBES Zoe DELENA Karon DELENA, et al. Surgery versus conservative management of stable thoracolumbar fracture: the PRESTO feasibility RCT. [place] ([place]): [employer]; 2021 Nov. [facility] Technology Assessment, No. 25.62.) Appendix 3, Oswestry Disability Index category descriptors. Available from:  FindJewelers.cz  Minimally Clinically Important Difference (MCID) = 12.8%  COGNITION: Overall cognitive status: Within functional limits for tasks assessed     SENSATION: L leg will go numb when walking on an intermittent basis - not consistent; resolves with bending forward touching ground - happens about every 2 weeks   MUSCLE LENGTH: Hamstrings: Right 75 deg; Left 70 deg Thomas test: Right tight  > Left   POSTURE: rounded shoulders, forward head, flexed trunk , and weight shift  left; R LE in hip and knee flexion; R calf atrophied   PALPATION: Tight and tender R iliacus, psoas compared to L; muscular tightness R lower lumbar/SI area; R posterior hip through the piriformis and glut med/min/obturator   LUMBAR ROM:   AROM eval  Flexion 90% wide based stance   Extension 25%  Right lateral flexion 70%  Left lateral flexion 80%  Right rotation 60%  Left rotation 45%   (Blank rows = not tested)  LOWER EXTREMITY MMT:      MMT  Right eval Left eval  Hip flexion 4 5  Hip extension 4- 4-  Hip abduction 4 4+  Hip adduction    Hip internal rotation    Hip external rotation    Knee flexion 5 5  Knee extension 5 5  Ankle dorsiflexion    Ankle plantarflexion    Ankle inversion    Ankle eversion     (Blank rows = not tested)  LOWER EXTREMITY ROM: limited end range hip extension; rotation R > L    Right eval Left eval  Hip flexion    Hip extension    Hip abduction    Hip adduction    Hip internal rotation    Hip external rotation    Knee flexion    Knee extension    Ankle dorsiflexion    Ankle plantarflexion    Ankle inversion    Ankle eversion     (Blank rows = not tested)  LUMBAR SPECIAL TESTS:  Straight leg raise test: Negative and Slump test: Negative  FUNCTIONAL TESTS:  5 times sit to stand: 10.53 sec  SLS R 10 sec some instability: L 5 sec instability  GAIT: Distance walked: 40 feet  Assistive device utilized:  None Level of assistance: Complete Independence Comments: abnormal gait pattern; Trendelenburg gait   TREATMENT DATE: 06/17/24 - eval findings; POC; HEP                                                                                                                                  PATIENT EDUCATION:  Education details: HEP  Person educated: Patient Education method: Explanation, Demonstration, Tactile cues, Verbal cues, and Handouts Education comprehension: verbalized understanding, returned demonstration, verbal cues required, tactile cues required, and needs further education  HOME EXERCISE PROGRAM: Access Code: [email or web address] URL: [email or web address] Date: 06/17/2024 Prepared by: Celyn Holt  Exercises - Supine Piriformis Stretch with Leg Straight  - 2 x daily - 7 x weekly - 1 sets - 3 reps - 30 sec  hold - Hooklying Clamshell with Resistance  - 1 x daily - 7 x weekly - 1 sets - 3 reps - 30 sec  hold - Supine Bridge with Resistance Band  - 2 x daily - 7 x weekly - 1-2 sets - 10 reps - 5-10 sec  hold  ASSESSMENT:  CLINICAL IMPRESSION: Patient is a 73 y.o. female who was seen today for physical  therapy evaluation and treatment for acute R sided LBP. 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 Patient will benefit from PT to address problems identified.   OBJECTIVE IMPAIRMENTS: Abnormal gait, decreased activity tolerance, decreased balance, decreased mobility, decreased ROM, decreased strength, increased fascial restrictions, improper body mechanics, postural dysfunction, and pain.   ACTIVITY LIMITATIONS: carrying, lifting, bending, sitting, standing, squatting, sleeping, stairs, transfers, and locomotion level  PARTICIPATION LIMITATIONS: cleaning, driving,  shopping, and community activity  PERSONAL FACTORS: Fitness, Past/current experiences, Time since onset of injury/illness/exacerbation, and comorbidities as noted above are also affecting patient's functional outcome.   REHAB POTENTIAL: Good  CLINICAL DECISION MAKING: Evolving/moderate complexity  EVALUATION COMPLEXITY: Moderate   GOALS: Goals reviewed with patient? Yes  SHORT TERM GOALS: Target date: 07/15/2024   Independent in initial HEP  Baseline: Goal status: INITIAL  2.  Patient demonstrates proper transfers sit to sidelying to supine and reverse to decrease irritation through the LB/R hip  Baseline:  Goal status: INITIAL  3.  Patient reports performing consistent HEP on a daily basis at least 5 days/week Baseline:  Goal status: INITIAL   LONG TERM GOALS: Target date: 08/12/2024    Decrease pain in LB and R hip by 50-75% allowing patient to participate in normal functional and recreational activities  Baseline:  Goal status: INITIAL  2.  Patient reports awakening with LB and R hip pain no greater than 2/10 in the morning Baseline:  Goal status: INITIAL  3.  4+/5 to 5/5 strength bilat LE's  Baseline:  Goal status: INITIAL  4.  Patient reports ability to walk for 15-20 min with minimal to no increase in baseline back and hip pain  Baseline:  Goal status: INITIAL  5.  Patient demonstrates improvement in modified oswestry score by 15%  Baseline: eval - Modified Oswestry: 20/50; 40%  Goal status: INITIAL  6.  Independent in advance HEP including aquatic therapy as indicated  Baseline:  Goal status: INITIAL  PLAN:  PT FREQUENCY: 2x/week  PT DURATION: 8 weeks  PLANNED INTERVENTIONS: 97164- PT Re-evaluation, 97110-Therapeutic exercises, 97530- Therapeutic activity, [id number]- Neuromuscular re-education, 97535- Self Care, [id number]- Manual therapy, [phone]- Aquatic Therapy, Patient/Family education, Balance training, Taping, and Joint mobilization.  PLAN FOR NEXT  SESSION: review and progress with exercise program; continue back care and ergonomic education; manual work and modalities as indicated

## 2024-06-19 ENCOUNTER — Ambulatory Visit: Admitting: Physical Therapy

## 2024-06-19 ENCOUNTER — Encounter: Payer: Self-pay | Admitting: Physical Therapy

## 2024-06-19 DIAGNOSIS — R29898 Other symptoms and signs involving the musculoskeletal system: Secondary | ICD-10-CM | POA: Diagnosis not present

## 2024-06-19 DIAGNOSIS — M545 Low back pain, unspecified: Secondary | ICD-10-CM | POA: Diagnosis not present

## 2024-06-19 DIAGNOSIS — M25551 Pain in right hip: Secondary | ICD-10-CM

## 2024-06-19 DIAGNOSIS — R2689 Other abnormalities of gait and mobility: Secondary | ICD-10-CM

## 2024-06-19 DIAGNOSIS — M6281 Muscle weakness (generalized): Secondary | ICD-10-CM | POA: Diagnosis not present

## 2024-06-19 DIAGNOSIS — M25552 Pain in left hip: Secondary | ICD-10-CM | POA: Diagnosis not present

## 2024-06-19 DIAGNOSIS — M5459 Other low back pain: Secondary | ICD-10-CM | POA: Diagnosis not present

## 2024-06-23 ENCOUNTER — Encounter (HOSPITAL_BASED_OUTPATIENT_CLINIC_OR_DEPARTMENT_OTHER): Payer: Self-pay | Admitting: Family Medicine

## 2024-06-23 ENCOUNTER — Encounter: Payer: Self-pay | Admitting: Physical Therapy

## 2024-06-23 ENCOUNTER — Ambulatory Visit: Admitting: Physical Therapy

## 2024-06-23 ENCOUNTER — Ambulatory Visit (INDEPENDENT_AMBULATORY_CARE_PROVIDER_SITE_OTHER): Admitting: Family Medicine

## 2024-06-23 VITALS — BP 135/82 | HR 85 | Ht 66.5 in | Wt 180.3 lb

## 2024-06-23 DIAGNOSIS — M545 Low back pain, unspecified: Secondary | ICD-10-CM

## 2024-06-23 DIAGNOSIS — R29898 Other symptoms and signs involving the musculoskeletal system: Secondary | ICD-10-CM | POA: Diagnosis not present

## 2024-06-23 DIAGNOSIS — M25552 Pain in left hip: Secondary | ICD-10-CM | POA: Diagnosis not present

## 2024-06-23 DIAGNOSIS — R200 Anesthesia of skin: Secondary | ICD-10-CM | POA: Insufficient documentation

## 2024-06-23 DIAGNOSIS — M5459 Other low back pain: Secondary | ICD-10-CM | POA: Diagnosis not present

## 2024-06-23 DIAGNOSIS — R2689 Other abnormalities of gait and mobility: Secondary | ICD-10-CM | POA: Diagnosis not present

## 2024-06-23 DIAGNOSIS — M6281 Muscle weakness (generalized): Secondary | ICD-10-CM | POA: Diagnosis not present

## 2024-06-23 DIAGNOSIS — M25551 Pain in right hip: Secondary | ICD-10-CM

## 2024-06-23 NOTE — Assessment & Plan Note (Signed)
 Agree with continuing with physical therapy as initially ordered by PCP.  Can consider use of NSAID to help with pain control as needed. She notes that she has been having improvement with physical therapy. We discussed general considerations and if responding well to PT, would recommend continuing with conservative measures Ultimately, imaging could be considered if not improving as expected or if any worsening or change in symptoms is noted.  Did discuss red flags for patient to be mindful of and if these occur, more urgent evaluation would be warranted.  These include bowel or bladder incontinence, motor deficits.

## 2024-06-23 NOTE — Patient Instructions (Signed)

## 2024-06-23 NOTE — Therapy (Signed)
 OUTPATIENT PHYSICAL THERAPY THORACOLUMBAR TREATMENT   Patient Name: Teresa Spence MRN: [id number] DOB:[birth year], 73 y.o., female Today's Date: 06/23/2024  END OF SESSION:  PT End of Session - 06/23/24 1451     Visit Number 3    Number of Visits 16    Date for Recertification  08/12/24    Authorization Type [employer]etna medicare copay $10 no auth required    Authorization - Visit Number 3    Progress Note Due on Visit 11    PT Start Time 1400    PT Stop Time 1445    PT Time Calculation (min) 45 min    Activity Tolerance Patient tolerated treatment well    Behavior During Therapy WFL for tasks assessed/performed            Past Medical History:  Diagnosis Date   Allergy 1975   Environmental   Arthritis 1985   Bilateral carpal tunnel syndrome 01/20/2020   Diabetes mellitus without complication (HCC)    Heart murmur 105w   Hypertension    Past Surgical History:  Procedure Laterality Date   BUNIONECTOMY Right 06/10/2019   HAMMER TOE SURGERY     JOINT REPLACEMENT  2015   MEDIAL PARTIAL KNEE REPLACEMENT     TONSILLECTOMY     Patient Active Problem List   Diagnosis Date Noted   Left leg numbness 06/23/2024   Acute right-sided low back pain without sciatica 06/02/2024   Vitamin D  deficiency 06/02/2024   Uncontrolled type 2 diabetes mellitus with hypoglycemia (HCC) 06/02/2024   Sesamoiditis of left foot 09/06/2023   Microalbuminuria 06/12/2023   Type 2 diabetes mellitus with diabetic polyneuropathy, without long-term current use of insulin (HCC) 08/28/2021   CKD (chronic kidney disease) stage 2, GFR 60-89 ml/min 08/28/2021   Hyperlipidemia LDL goal <70 08/23/2020   DDD (degenerative disc disease), cervical 12/01/2019   Fine motor skill loss 12/01/2019   Tinnitus, left 09/14/2019   Neuropathy 06/03/2019   Post-menopausal 06/03/2019   Generalized osteoarthritis of hand 09/11/2018   Hyperlipidemia associated with type 2 diabetes mellitus (HCC) 05/06/2018   Dermoid cyst of  right ear 05/06/2018   Numbness and tingling in both hands 05/08/2017   Primary osteoarthritis involving multiple joints 05/08/2017   Essential hypertension 12/27/2016   Uncontrolled type II diabetes with stage 2 chronic kidney disease 12/27/2016    PCP: Vermell LITTIE Bologna; PA-C  REFERRING PROVIDER: Vermell LITTIE Bologna; PA-C  REFERRING DIAG: Acute R sided LBP   Rationale for Evaluation and Treatment: Rehabilitation  THERAPY DIAG:  Pain of right hip joint  Muscle weakness (generalized)  ONSET DATE: 10/12/23  SUBJECTIVE:  SUBJECTIVE STATEMENT:  Pt saw an orthopedist yesterday who stated that he would recommend PT and possible injections if symptoms do not resolve.Pt states that the use of her knee pillow and rolling her glutes on a ball have helped her pain  EVAL: Patient has had increased pain in the R hip and knee since February, 2025 with no known injury. Patient reports that she went to [place] in the spring and she did a lot of walking including stairs. She walked every day for 9 days. She has pain in the R hip and R knee. She braced the knee and use lidocaine on the hip. Pain is still there in the R hip and knee in the morning and decreases in the day and by the end of the day. She has no pain at the end of the day. She has had leg length difference since childhood.     PERTINENT HISTORY:  Leg length difference since childhood R LE longer about an inch compared to L. Uncontrolled type 2 DM with polyneuropathy; HTN; hyperlipidemia; vitamin D  deficiency; R sided LBP without sciatica   PAIN:  Are you having pain? Yes: NPRS scale: 3/10; worst in past week 3/10; best in past week 0/10 Pain location: R hip;  Pain description: sharp  Aggravating factors: morning  Relieving factors: moving; stretching; heat;  lidocaine roller; better as the day goes by   PRECAUTIONS: None  RED FLAGS: None   WEIGHT BEARING RESTRICTIONS: No  FALLS:  Has patient fallen in last 6 months? No  LIVING ENVIRONMENT: Lives with: lives alone  Lives in: House/apartment Stairs: Yes: External: 1 steps; none; second floor but seldom goes up to second floor  Has following equipment at home: walk in shower  OCCUPATION: retired for corporate accounting retired ~ 5 yrs ago; takes classes and is involved with various groups; sedentary; sits in soft sofa when at home - active with art   PLOF: Independent  PATIENT GOALS: decrease pain and walk with less pain   NEXT MD VISIT: 09/01/24  OBJECTIVE:  Note: Objective measures were completed at Evaluation unless otherwise noted.  DIAGNOSTIC FINDINGS:  No diagnostic test for lumbar spine  PATIENT SURVEYS:  Modified Oswestry: 20/50; 40%   Interpretation of scores: Score Category Description  0-20% Minimal Disability The patient can cope with most living activities. Usually no treatment is indicated apart from advice on lifting, sitting and exercise  21-40% Moderate Disability The patient experiences more pain and difficulty with sitting, lifting and standing. Travel and social life are more difficult and they may be disabled from work. Personal care, sexual activity and sleeping are not grossly affected, and the patient can usually be managed by conservative means  41-60% Severe Disability Pain remains the main problem in this group, but activities of daily living are affected. These patients require a detailed investigation  61-80% Crippled Back pain impinges on all aspects of the patient's life. Positive intervention is required  81-100% Bed-bound  These patients are either bed-bound or exaggerating their symptoms  Bluford FORBES Zoe DELENA Karon DELENA, et al. Surgery versus conservative management of stable thoracolumbar fracture: the PRESTO feasibility RCT. [place] ([place]):  [employer]; 2021 Nov. [facility] Technology Assessment, No. 25.62.) Appendix 3, Oswestry Disability Index category descriptors. Available from: Find[employer]el[employer].cz  Minimally Clinically Important Difference (MCID) = 12.8%  COGNITION: Overall cognitive status: Within functional limits for tasks assessed     SENSATION: L leg will go numb when walking on an intermittent basis - not consistent; resolves  with bending forward touching ground - happens about every 2 weeks   MUSCLE LENGTH: Hamstrings: Right 75 deg; Left 70 deg Thomas test: Right tight  > Left   POSTURE: rounded shoulders, forward head, flexed trunk , and weight shift left; R LE in hip and knee flexion; R calf atrophied   PALPATION: Tight and tender R iliacus, psoas compared to L; muscular tightness R lower lumbar/SI area; R posterior hip through the piriformis and glut med/min/obturator   LUMBAR ROM:   AROM eval  Flexion 90% wide based stance   Extension 25%  Right lateral flexion 70%  Left lateral flexion 80%  Right rotation 60%  Left rotation 45%   (Blank rows = not tested)  LOWER EXTREMITY MMT:      MMT  Right eval Left eval  Hip flexion 4 5  Hip extension 4- 4-  Hip abduction 4 4+  Hip adduction    Hip internal rotation    Hip external rotation    Knee flexion 5 5  Knee extension 5 5  Ankle dorsiflexion    Ankle plantarflexion    Ankle inversion    Ankle eversion     (Blank rows = not tested)  LOWER EXTREMITY ROM: limited end range hip extension; rotation R > L    Right eval Left eval  Hip flexion    Hip extension    Hip abduction    Hip adduction    Hip internal rotation    Hip external rotation    Knee flexion    Knee extension    Ankle dorsiflexion    Ankle plantarflexion    Ankle inversion    Ankle eversion     (Blank rows = not tested)  LUMBAR SPECIAL TESTS:  Straight leg raise test: Negative and Slump test: Negative  FUNCTIONAL TESTS:  5 times  sit to stand: 10.53 sec  SLS R 10 sec some instability: L 5 sec instability  GAIT: Distance walked: 40 feet  Assistive device utilized: None Level of assistance: Complete Independence Comments: abnormal gait pattern; Trendelenburg gait   TREATMENT DATE:   [facility] Adult PT Treatment:                                                DATE: 06/23/24  Manual Therapy: STM and TPR Rt glutes and piriformis Neuromuscular re-ed: Sidleying hip abd with cuing to prevent compensations 2 x 10 Sidelying clam red TB 3 x 10 Sidelying reverse clam red TB x 10 --> no resistance x 10 -difficult! Therapeutic Activity: Side step red TB Backward walking red TB Sit <> stand with suitcase hold 5# x 10 bilat   [facility] Adult PT Treatment:                                                DATE: 06/19/24 Therapeutic Exercise: Supine piriformis stretch with leg straight using strap 2x30 sec  Hooklying clamshell with resistance using green band 2x20  Seated heel raises 2x10 -pt reports having past toe surgeries where she can only do heel raises in seated, not in standing or added weight   Neuromuscular re-ed: Seated ball press on lap for core activation 2x10  AROM shoulder retraction x15 Supine bridge with  resistance band using green band 2x15 Self Care: Explained physiology of oxygen molecules entering into tissue while stretching  Presented picture of scapula for visual cues during AROM cervical retraction exercise Discussed how neurofascial tension relates to pain in the body  Patient education on dry needling and how it treats the impairment                                                                                                                               PATIENT EDUCATION:  Education details: HEP  Person educated: Patient Education method: Explanation, Demonstration, Tactile cues, and Verbal cues Education comprehension: verbalized understanding, returned demonstration, verbal cues required,  tactile cues required, and needs further education  HOME EXERCISE PROGRAM: Access Code: [email or web address] URL: [email or web address] Date: 06/17/2024 Prepared by: Celyn Holt  Exercises - Supine Piriformis Stretch with Leg Straight  - 2 x daily - 7 x weekly - 1 sets - 3 reps - 30 sec  hold - Hooklying Clamshell with Resistance  - 1 x daily - 7 x weekly - 1 sets - 3 reps - 30 sec  hold - Supine Bridge with Resistance Band  - 2 x daily - 7 x weekly - 1-2 sets - 10 reps - 5-10 sec  hold  ASSESSMENT:  CLINICAL IMPRESSION:  Performed manual work on Rt hip with pt with notable muscle spasticity in Rt piriformis which responds well to manual work. Pt with difficulty with sidelying hip strengthening exercises requiring cuing to prevent compensations - not added to HEP at this time. Progressed to more functional standing exercises with pt with good response.     GOALS: Goals reviewed with patient? Yes  SHORT TERM GOALS: Target date: 07/15/2024   Independent in initial HEP  Baseline: Goal status: INITIAL  2.  Patient demonstrates proper transfers sit to sidelying to supine and reverse to decrease irritation through the LB/R hip  Baseline:  Goal status: INITIAL  3.  Patient reports performing consistent HEP on a daily basis at least 5 days/week Baseline:  Goal status: INITIAL   LONG TERM GOALS: Target date: 08/12/2024    Decrease pain in LB and R hip by 50-75% allowing patient to participate in normal functional and recreational activities  Baseline:  Goal status: INITIAL  2.  Patient reports awakening with LB and R hip pain no greater than 2/10 in the morning Baseline:  Goal status: INITIAL  3.  4+/5 to 5/5 strength bilat LE's  Baseline:  Goal status: INITIAL  4.  Patient reports ability to walk for 15-20 min with minimal to no increase in baseline back and hip pain  Baseline:  Goal status: INITIAL  5.  Patient demonstrates improvement in modified oswestry score by  15%  Baseline: eval - Modified Oswestry: 20/50; 40%  Goal status: INITIAL  6.  Independent in advance HEP including aquatic therapy as indicated  Baseline:  Goal status: INITIAL  PLAN:  PT FREQUENCY: 2x/week  PT DURATION:  8 weeks  PLANNED INTERVENTIONS: 97164- PT Re-evaluation, 97110-Therapeutic exercises, 97530- Therapeutic activity, [id number]- Neuromuscular re-education, 97535- Self Care, [id number]- Manual therapy, [phone]- Aquatic Therapy, Patient/Family education, Balance training, Taping, and Joint mobilization.  PLAN FOR NEXT SESSION: review and progress with exercise program; continue back care and ergonomic education; manual work and modalities as indicated. UPDATE HEP Darice Conine, PT,DPT10/14/252:52 PM

## 2024-06-23 NOTE — Assessment & Plan Note (Signed)
 Based on history, suspect possible spinal stenosis with reported symptoms suggesting neurogenic claudication.  We discussed this and considerations moving forward.  Would be reasonable to continue with physical therapy as it is currently arranged. As above, we also discussed considerations related to imaging.  We can hold off for now, however if symptoms are not responding to physical therapy or if any progression/worsening is noted, would likely proceed with initial x-rays at that time with consideration for MRI.  We did discuss rationale for typically having MRI completed, specifically if not responding as expected with conservative measures, MRI allows for additional evaluation with possible consideration for interventional options including injections.

## 2024-06-23 NOTE — Progress Notes (Signed)
    Procedures performed today:    None.  Independent interpretation of notes and tests performed by another provider:   None.  Brief History, Exam, Impression, and Recommendations:    BP 135/82 (BP Location: Right Arm, Patient Position: Sitting, Cuff Size: Normal)   Pulse 85   Ht 5' 6.5 (1.689 m)   Wt 180 lb 4.8 oz (81.8 kg)   SpO2 98%   BMI 28.67 kg/m   Patient reports that she has been experiencing symptoms in both lower extremities. For her right lower extremity, she has primarily had pain located around her hip which will radiate into the leg at times, she may have some symptoms near her knee that is mostly over medial aspect extending down towards foot. For her left leg, she has noticed some symptoms of numbness, not as much pain with left leg.  This primarily occurs when walking or standing for longer periods of time.  She has found relief from this with bending over.  She notes that if she is in the grocery store, she may bend over as though looking for something on the bottom shelf which will relieve symptoms for her. Her PCP did refer her to physical therapy which she has been able to get started with and she reports that this so far has been helpful for her.  She has met with physical therapist couple times and does have upcoming appointment scheduled as well.  She has been working on home exercises as per PT.  On exam, patient is in no acute distress, vital signs stable.  Assessing gait in office, she does have increased pelvic mobility.  It was noted on her PT assessment that she does have leg length discrepancy. She has normal range of motion at bilateral hips, negative FABER and FADIR bilaterally.  Negative straight leg raise bilaterally.  Normal patellar and Achilles reflexes bilaterally.  Acute right-sided low back pain without sciatica Assessment & Plan: Agree with continuing with physical therapy as initially ordered by PCP.  Can consider use of NSAID to help with  pain control as needed. She notes that she has been having improvement with physical therapy. We discussed general considerations and if responding well to PT, would recommend continuing with conservative measures Ultimately, imaging could be considered if not improving as expected or if any worsening or change in symptoms is noted.  Did discuss red flags for patient to be mindful of and if these occur, more urgent evaluation would be warranted.  These include bowel or bladder incontinence, motor deficits.   Left leg numbness Assessment & Plan: Based on history, suspect possible spinal stenosis with reported symptoms suggesting neurogenic claudication.  We discussed this and considerations moving forward.  Would be reasonable to continue with physical therapy as it is currently arranged. As above, we also discussed considerations related to imaging.  We can hold off for now, however if symptoms are not responding to physical therapy or if any progression/worsening is noted, would likely proceed with initial x-rays at that time with consideration for MRI.  We did discuss rationale for typically having MRI completed, specifically if not responding as expected with conservative measures, MRI allows for additional evaluation with possible consideration for interventional options including injections.   Return if symptoms worsen or fail to improve.   ___________________________________________ Merced Brougham de [place], MD, ABFM, [facility] Primary Care and Sports Medicine [facility]

## 2024-06-26 ENCOUNTER — Ambulatory Visit

## 2024-06-26 DIAGNOSIS — R29898 Other symptoms and signs involving the musculoskeletal system: Secondary | ICD-10-CM | POA: Diagnosis not present

## 2024-06-26 DIAGNOSIS — M6281 Muscle weakness (generalized): Secondary | ICD-10-CM | POA: Diagnosis not present

## 2024-06-26 DIAGNOSIS — M545 Low back pain, unspecified: Secondary | ICD-10-CM | POA: Diagnosis not present

## 2024-06-26 DIAGNOSIS — R2689 Other abnormalities of gait and mobility: Secondary | ICD-10-CM

## 2024-06-26 DIAGNOSIS — M5459 Other low back pain: Secondary | ICD-10-CM | POA: Diagnosis not present

## 2024-06-26 DIAGNOSIS — M25552 Pain in left hip: Secondary | ICD-10-CM | POA: Diagnosis not present

## 2024-06-26 DIAGNOSIS — M25551 Pain in right hip: Secondary | ICD-10-CM

## 2024-06-26 NOTE — Therapy (Signed)
 OUTPATIENT PHYSICAL THERAPY THORACOLUMBAR TREATMENT   Patient Name: Teresa Spence MRN: [id number] DOB:Apr 06, 1951, 73 y.o., female Today's Date: 06/26/2024  END OF SESSION:  PT End of Session - 06/26/24 0856     Visit Number 4    Number of Visits 16    Date for Recertification  08/12/24    Authorization Type [employer]etna medicare copay $10 no auth required    Progress Note Due on Visit 11    PT Start Time [phone]   pt arrived late   PT Stop Time 0930    PT Time Calculation (min) 34 min    Activity Tolerance Patient tolerated treatment well    Behavior During Therapy [facility] for tasks assessed/performed            Past Medical History:  Diagnosis Date   Allergy 1975   Environmental   Arthritis 1985   Bilateral carpal tunnel syndrome 01/20/2020   Diabetes mellitus without complication (HCC)    Heart murmur 105w   Hypertension    Past Surgical History:  Procedure Laterality Date   BUNIONECTOMY Right 06/10/2019   HAMMER TOE SURGERY     JOINT REPLACEMENT  2015   MEDIAL PARTIAL KNEE REPLACEMENT     TONSILLECTOMY     Patient Active Problem List   Diagnosis Date Noted   Left leg numbness 06/23/2024   Acute right-sided low back pain without sciatica 06/02/2024   Vitamin D  deficiency 06/02/2024   Uncontrolled type 2 diabetes mellitus with hypoglycemia (HCC) 06/02/2024   Sesamoiditis of left foot 09/06/2023   Microalbuminuria 06/12/2023   Type 2 diabetes mellitus with diabetic polyneuropathy, without long-term current use of insulin (HCC) 08/28/2021   CKD (chronic kidney disease) stage 2, GFR 60-89 ml/min 08/28/2021   Hyperlipidemia LDL goal <70 08/23/2020   DDD (degenerative disc disease), cervical 12/01/2019   Fine motor skill loss 12/01/2019   Tinnitus, left 09/14/2019   Neuropathy 06/03/2019   Post-menopausal 06/03/2019   Generalized osteoarthritis of hand 09/11/2018   Hyperlipidemia associated with type 2 diabetes mellitus (HCC) 05/06/2018   Dermoid cyst of right ear  05/06/2018   Numbness and tingling in both hands 05/08/2017   Primary osteoarthritis involving multiple joints 05/08/2017   Essential hypertension 12/27/2016   Uncontrolled type II diabetes with stage 2 chronic kidney disease 12/27/2016    PCP: Vermell LITTIE Bologna; Spence-C  REFERRING PROVIDER: [place]ell LITTIE Bologna; Spence-C  REFERRING DIAG: Acute R sided LBP   Rationale for Evaluation and Treatment: Rehabilitation  THERAPY DIAG:  Pain of right hip joint  Muscle weakness (generalized)  Other abnormalities of gait and mobility  Other symptoms and signs involving the musculoskeletal system  ONSET DATE: 10/12/23  SUBJECTIVE:  SUBJECTIVE STATEMENT: Patient reports 7/10 pain in Rt hip and knee today; states she had a sharp, pulsing nerve pain in Rt ankle after last session that resolved on its own after approx 2 days.   EVAL: Patient has had increased pain in the R hip and knee since February, 2025 with no known injury. Patient reports that she went to [place] in the spring and she did a lot of walking including stairs. She walked every day for 9 days. She has pain in the R hip and R knee. She braced the knee and use lidocaine on the hip. Pain is still there in the R hip and knee in the morning and decreases in the day and by the end of the day. She has no pain at the end of the day. She has had leg length difference since childhood.     PERTINENT HISTORY:  Leg length difference since childhood R LE longer about an inch compared to L. Uncontrolled type 2 DM with polyneuropathy; HTN; hyperlipidemia; vitamin D  deficiency; R sided LBP without sciatica   PAIN:  Are you having pain? Yes: NPRS scale: 7/10 Pain location: R hip;  Pain description: sharp  Aggravating factors: morning  Relieving factors: moving; stretching;  heat; lidocaine roller; better as the day goes by   PRECAUTIONS: None  RED FLAGS: None   WEIGHT BEARING RESTRICTIONS: No  FALLS:  Has patient fallen in last 6 months? No  LIVING ENVIRONMENT: Lives with: lives alone  Lives in: House/apartment Stairs: Yes: External: 1 steps; none; second floor but seldom goes up to second floor  Has following equipment at home: walk in shower  OCCUPATION: retired for corporate accounting retired ~ 5 yrs ago; takes classes and is involved with various groups; sedentary; sits in soft sofa when at home - active with art   PLOF: Independent  PATIENT GOALS: decrease pain and walk with less pain   NEXT MD VISIT: 09/01/24  OBJECTIVE:  Note: Objective measures were completed at Evaluation unless otherwise noted.  DIAGNOSTIC FINDINGS:  No diagnostic test for lumbar spine  PATIENT SURVEYS:  Modified Oswestry: 20/50; 40%   Interpretation of scores: Score Category Description  0-20% Minimal Disability The patient can cope with most living activities. Usually no treatment is indicated apart from advice on lifting, sitting and exercise  21-40% Moderate Disability The patient experiences more pain and difficulty with sitting, lifting and standing. Travel and social life are more difficult and they may be disabled from work. Personal care, sexual activity and sleeping are not grossly affected, and the patient can usually be managed by conservative means  41-60% Severe Disability Pain remains the main problem in this group, but activities of daily living are affected. These patients require a detailed investigation  61-80% Crippled Back pain impinges on all aspects of the patient's life. Positive intervention is required  81-100% Bed-bound  These patients are either bed-bound or exaggerating their symptoms  Teresa Spence Teresa Spence Teresa Spence, et al. Surgery versus conservative management of stable thoracolumbar fracture: the PRESTO feasibility RCT. [place]  ([place]): [employer]; 2021 Nov. Teresa Spence Technology Assessment, No. 25.62.) Appendix 3, Oswestry Disability Index category descriptors. Available from: [employer]cz  Minimally Clinically Important Difference (MCID) = 12.8%  COGNITION: Overall cognitive status: Within functional limits for tasks assessed     SENSATION: L leg will go numb when walking on an intermittent basis - not consistent; resolves with bending forward touching ground - happens about every 2 weeks   MUSCLE LENGTH: Hamstrings:  Right 75 deg; Left 70 deg Thomas test: Right tight  > Left   POSTURE: rounded shoulders, forward head, flexed trunk , and weight shift left; R LE in hip and knee flexion; R calf atrophied   PALPATION: Tight and tender R iliacus, psoas compared to L; muscular tightness R lower lumbar/SI area; R posterior hip through the piriformis and glut med/min/obturator   LUMBAR ROM:   AROM eval  Flexion 90% wide based stance   Extension 25%  Right lateral flexion 70%  Left lateral flexion 80%  Right rotation 60%  Left rotation 45%   (Blank rows = not tested)  LOWER EXTREMITY MMT:      MMT  Right eval Left eval  Hip flexion 4 5  Hip extension 4- 4-  Hip abduction 4 4+  Hip adduction    Hip internal rotation    Hip external rotation    Knee flexion 5 5  Knee extension 5 5  Ankle dorsiflexion    Ankle plantarflexion    Ankle inversion    Ankle eversion     (Blank rows = not tested)  LOWER EXTREMITY ROM: limited end range hip extension; rotation R > L    Right eval Left eval  Hip flexion    Hip extension    Hip abduction    Hip adduction    Hip internal rotation    Hip external rotation    Knee flexion    Knee extension    Ankle dorsiflexion    Ankle plantarflexion    Ankle inversion    Ankle eversion     (Blank rows = not tested)  LUMBAR SPECIAL TESTS:  Straight leg raise test: Negative and Slump test: Negative  FUNCTIONAL TESTS:  5  times sit to stand: 10.53 sec  SLS R 10 sec some instability: L 5 sec instability  GAIT: Distance walked: 40 feet  Assistive device utilized: None Level of assistance: Complete Independence Comments: abnormal gait pattern; Trendelenburg gait    [facility] Adult PT Treatment:                                                DATE: 06/26/2024 Neuromuscular re-Teresa: Hooklying: Hip add with yoga block squeeze 10 x 5 sec Hip abd press with black TB 10 x 5 sec Bent knee fall out + green TB x 15 (bil) (Lt) Side Lying: Clamshells + red TB 10 x 3 sec hold  Straight leg hip abd + red TB around thighs 10 x 3 sec hold Reverse clamshells + yellow TB x 10  Over & back with straight leg x10  Therapeutic Activity: Resisted side stepping + red TB crossed at ankles Seated hip flexor stretch  Supine piriformis stretch with opp leg straight    [facility] Adult PT Treatment:                                                DATE: 06/23/24  Manual Therapy: STM and TPR Rt glutes and piriformis Neuromuscular re-Teresa: Sidleying hip abd with cuing to prevent compensations 2 x 10 Sidelying clam red TB 3 x 10 Sidelying reverse clam red TB x 10 --> no resistance x 10 -difficult! Therapeutic Activity: Side step red TB Backward  walking red TB Sit <> stand with suitcase hold 5# x 10 bilat   [facility] Adult PT Treatment:                                                DATE: 06/19/24 Therapeutic Exercise: Supine piriformis stretch with leg straight using strap 2x30 sec  Hooklying clamshell with resistance using green band 2x20  Seated heel raises 2x10 -pt reports having past toe surgeries where she can only do heel raises in seated, not in standing or added weight   Neuromuscular re-Teresa: Seated ball press on lap for core activation 2x10  AROM shoulder retraction x15 Supine bridge with resistance band using green band 2x15 Self Care: Explained physiology of oxygen molecules entering into tissue while stretching  Presented  picture of scapula for visual cues during AROM cervical retraction exercise Discussed how neurofascial tension relates to pain in the body  Patient education on dry needling and how it treats the impairment                                                                                                                           PATIENT EDUCATION:  Education details: HEP  Person educated: Patient Education method: Explanation, Demonstration, Tactile cues, and Verbal cues Education comprehension: verbalized understanding, returned demonstration, verbal cues required, tactile cues required, and needs further education  HOME EXERCISE PROGRAM: Access Code: [email or web address] URL: [email or web address] Date: 06/17/2024 Prepared by: Celyn Holt  Exercises - Supine Piriformis Stretch with Leg Straight  - 2 x daily - 7 x weekly - 1 sets - 3 reps - 30 sec  hold - Hooklying Clamshell with Resistance  - 1 x daily - 7 x weekly - 1 sets - 3 reps - 30 sec  hold - Supine Bridge with Resistance Band  - 2 x daily - 7 x weekly - 1-2 sets - 10 reps - 5-10 sec  hold  ASSESSMENT:  CLINICAL IMPRESSION: Patient arrived late but treated for remainder of session. Focused on hip strengthening, progressing from isometrics to dynamic hip abduction variations. Moderate fatigue noted in Rt glutes with side lying exercises, however no aggravation of symptoms. Patient reported decreased hip pain and improved mobility by end of session.    GOALS: Goals reviewed with patient? Yes  SHORT TERM GOALS: Target date: 07/15/2024   Independent in initial HEP  Baseline: Goal status: INITIAL  2.  Patient demonstrates proper transfers sit to sidelying to supine and reverse to decrease irritation through the LB/R hip  Baseline:  Goal status: INITIAL  3.  Patient reports performing consistent HEP on a daily basis at least 5 days/week Baseline:  Goal status: INITIAL   LONG TERM GOALS: Target date: 08/12/2024     Decrease pain in LB and R hip by 50-75% allowing patient to participate in normal functional and recreational  activities  Baseline:  Goal status: INITIAL  2.  Patient reports awakening with LB and R hip pain no greater than 2/10 in the morning Baseline:  Goal status: INITIAL  3.  4+/5 to 5/5 strength bilat LE's  Baseline:  Goal status: INITIAL  4.  Patient reports ability to walk for 15-20 min with minimal to no increase in baseline back and hip pain  Baseline:  Goal status: INITIAL  5.  Patient demonstrates improvement in modified oswestry score by 15%  Baseline: eval - Modified Oswestry: 20/50; 40%  Goal status: INITIAL  6.  Independent in advance HEP including aquatic therapy as indicated  Baseline:  Goal status: INITIAL  PLAN:  PT FREQUENCY: 2x/week  PT DURATION: 8 weeks  PLANNED INTERVENTIONS: 97164- PT Re-evaluation, 97110-Therapeutic exercises, 97530- Therapeutic activity, [id number]- Neuromuscular re-education, 97535- Self Care, [id number]- Manual therapy, [phone]- Aquatic Therapy, Patient/Family education, Balance training, Taping, and Joint mobilization.  PLAN FOR NEXT SESSION: review and progress with exercise program; continue back care and ergonomic education; manual work and modalities as indicated. UPDATE HEP   Lamarr Price, PTA 06/26/24, 12:11 PM

## 2024-06-30 ENCOUNTER — Ambulatory Visit: Payer: Self-pay | Admitting: Physical Therapy

## 2024-06-30 ENCOUNTER — Encounter: Payer: Self-pay | Admitting: Physical Therapy

## 2024-06-30 DIAGNOSIS — M6281 Muscle weakness (generalized): Secondary | ICD-10-CM | POA: Diagnosis not present

## 2024-06-30 DIAGNOSIS — R29898 Other symptoms and signs involving the musculoskeletal system: Secondary | ICD-10-CM

## 2024-06-30 DIAGNOSIS — M545 Low back pain, unspecified: Secondary | ICD-10-CM | POA: Diagnosis not present

## 2024-06-30 DIAGNOSIS — M25551 Pain in right hip: Secondary | ICD-10-CM

## 2024-06-30 DIAGNOSIS — M25552 Pain in left hip: Secondary | ICD-10-CM | POA: Diagnosis not present

## 2024-06-30 DIAGNOSIS — M5459 Other low back pain: Secondary | ICD-10-CM | POA: Diagnosis not present

## 2024-06-30 DIAGNOSIS — R2689 Other abnormalities of gait and mobility: Secondary | ICD-10-CM

## 2024-06-30 NOTE — Therapy (Signed)
 OUTPATIENT PHYSICAL THERAPY THORACOLUMBAR TREATMENT   Patient Name: Teresa Spence MRN: [id number] DOB:1951-08-18, 73 y.o., female Today's Date: 06/30/2024  END OF SESSION:  PT End of Session - 06/30/24 1634     Visit Number 5    Number of Visits 16    Date for Recertification  08/12/24    Authorization Type aetna medicare copay $10 no auth required    Authorization - Visit Number 4    Progress Note Due on Visit 11    PT Start Time 1534    PT Stop Time 1612    PT Time Calculation (min) 38 min    Activity Tolerance Patient tolerated treatment well    Behavior During Therapy WFL for tasks assessed/performed            Past Medical History:  Diagnosis Date   Allergy 1975   Environmental   Arthritis 1985   Bilateral carpal tunnel syndrome 01/20/2020   Diabetes mellitus without complication (HCC)    Heart murmur 105w   Hypertension    Past Surgical History:  Procedure Laterality Date   BUNIONECTOMY Right 06/10/2019   HAMMER TOE SURGERY     JOINT REPLACEMENT  2015   MEDIAL PARTIAL KNEE REPLACEMENT     TONSILLECTOMY     Patient Active Problem List   Diagnosis Date Noted   Left leg numbness 06/23/2024   Acute right-sided low back pain without sciatica 06/02/2024   Vitamin D  deficiency 06/02/2024   Uncontrolled type 2 diabetes mellitus with hypoglycemia (HCC) 06/02/2024   Sesamoiditis of left foot 09/06/2023   Microalbuminuria 06/12/2023   Type 2 diabetes mellitus with diabetic polyneuropathy, without long-term current use of insulin (HCC) 08/28/2021   CKD (chronic kidney disease) stage 2, GFR 60-89 ml/min 08/28/2021   Hyperlipidemia LDL goal <70 08/23/2020   DDD (degenerative disc disease), cervical 12/01/2019   Fine motor skill loss 12/01/2019   Tinnitus, left 09/14/2019   Neuropathy 06/03/2019   Post-menopausal 06/03/2019   Generalized osteoarthritis of hand 09/11/2018   Hyperlipidemia associated with type 2 diabetes mellitus (HCC) 05/06/2018   Dermoid cyst of  right ear 05/06/2018   Numbness and tingling in both hands 05/08/2017   Primary osteoarthritis involving multiple joints 05/08/2017   Essential hypertension 12/27/2016   Uncontrolled type II diabetes with stage 2 chronic kidney disease 12/27/2016    PCP: Vermell LITTIE Bologna; PA-C  REFERRING PROVIDER: Vermell LITTIE Bologna; PA-C  REFERRING DIAG: Acute R sided LBP   Rationale for Evaluation and Treatment: Rehabilitation  THERAPY DIAG:  Muscle weakness (generalized)  Pain of right hip joint  Other abnormalities of gait and mobility  Other symptoms and signs involving the musculoskeletal system  Other low back pain  ONSET DATE: 10/12/23  SUBJECTIVE:  SUBJECTIVE STATEMENT: Pt reports no pain today. She enjoyed last session, and has been consistently doing her HEP.   EVAL: Patient has had increased pain in the R hip and knee since February, 2025 with no known injury. Patient reports that she went to [place] in the spring and she did a lot of walking including stairs. She walked every day for 9 days. She has pain in the R hip and R knee. She braced the knee and use lidocaine on the hip. Pain is still there in the R hip and knee in the morning and decreases in the day and by the end of the day. She has no pain at the end of the day. She has had leg length difference since childhood.     PERTINENT HISTORY:  Leg length difference since childhood R LE longer about an inch compared to L. Uncontrolled type 2 DM with polyneuropathy; HTN; hyperlipidemia; vitamin D  deficiency; R sided LBP without sciatica   PAIN:  Are you having pain? Yes: NPRS scale: 1/10 Pain location: R hip;  Pain description: sharp  Aggravating factors: morning  Relieving factors: moving; stretching; heat; lidocaine roller; better as the day goes by    PRECAUTIONS: None  RED FLAGS: None   WEIGHT BEARING RESTRICTIONS: No  FALLS:  Has patient fallen in last 6 months? No  LIVING ENVIRONMENT: Lives with: lives alone  Lives in: House/apartment Stairs: Yes: External: 1 steps; none; second floor but seldom goes up to second floor  Has following equipment at home: walk in shower  OCCUPATION: retired for corporate accounting retired ~ 5 yrs ago; takes classes and is involved with various groups; sedentary; sits in soft sofa when at home - active with art   PLOF: Independent  PATIENT GOALS: decrease pain and walk with less pain   NEXT MD VISIT: 09/01/24  OBJECTIVE:  Note: Objective measures were completed at Evaluation unless otherwise noted.  DIAGNOSTIC FINDINGS:  No diagnostic test for lumbar spine  PATIENT SURVEYS:  Modified Oswestry: 20/50; 40%   Interpretation of scores: Score Category Description  0-20% Minimal Disability The patient can cope with most living activities. Usually no treatment is indicated apart from advice on lifting, sitting and exercise  21-40% Moderate Disability The patient experiences more pain and difficulty with sitting, lifting and standing. Travel and social life are more difficult and they may be disabled from work. Personal care, sexual activity and sleeping are not grossly affected, and the patient can usually be managed by conservative means  41-60% Severe Disability Pain remains the main problem in this group, but activities of daily living are affected. These patients require a detailed investigation  61-80% Crippled Back pain impinges on all aspects of the patient's life. Positive intervention is required  81-100% Bed-bound  These patients are either bed-bound or exaggerating their symptoms  Bluford FORBES Zoe DELENA Karon DELENA, et al. Surgery versus conservative management of stable thoracolumbar fracture: the PRESTO feasibility RCT. [place] ([place]): [employer]; 2021 Nov. [facility]  Technology Assessment, No. 25.62.) Appendix 3, Oswestry Disability Index category descriptors. Available from: FindJewel[employer].cz  Minimally Clinically Important Difference (MCID) = 12.8%  COGNITION: Overall cognitive status: Within functional limits for tasks assessed     SENSATION: L leg will go numb when walking on an intermittent basis - not consistent; resolves with bending forward touching ground - happens about every 2 weeks   MUSCLE LENGTH: Hamstrings: Right 75 deg; Left 70 deg Thomas test: Right tight  > Left   POSTURE: rounded  shoulders, forward head, flexed trunk , and weight shift left; R LE in hip and knee flexion; R calf atrophied   PALPATION: Tight and tender R iliacus, psoas compared to L; muscular tightness R lower lumbar/SI area; R posterior hip through the piriformis and glut med/min/obturator   LUMBAR ROM:   AROM eval  Flexion 90% wide based stance   Extension 25%  Right lateral flexion 70%  Left lateral flexion 80%  Right rotation 60%  Left rotation 45%   (Blank rows = not tested)  LOWER EXTREMITY MMT:      MMT  Right eval Left eval  Hip flexion 4 5  Hip extension 4- 4-  Hip abduction 4 4+  Hip adduction    Hip internal rotation    Hip external rotation    Knee flexion 5 5  Knee extension 5 5  Ankle dorsiflexion    Ankle plantarflexion    Ankle inversion    Ankle eversion     (Blank rows = not tested)  LOWER EXTREMITY ROM: limited end range hip extension; rotation R > L    Right eval Left eval  Hip flexion    Hip extension    Hip abduction    Hip adduction    Hip internal rotation    Hip external rotation    Knee flexion    Knee extension    Ankle dorsiflexion    Ankle plantarflexion    Ankle inversion    Ankle eversion     (Blank rows = not tested)  LUMBAR SPECIAL TESTS:  Straight leg raise test: Negative and Slump test: Negative  FUNCTIONAL TESTS:  5 times sit to stand: 10.53 sec  SLS R 10 sec  some instability: L 5 sec instability  GAIT: Distance walked: 40 feet  Assistive device utilized: None Level of assistance: Complete Independence Comments: abnormal gait pattern; Trendelenburg gait  [facility] Adult PT Treatment:                                                DATE: 06/30/24  Neuromuscular re-ed: Hooklying: Hip add with yoga block squeeze 10 x 5 sec Hip abd press with black TB 10 x 5 sec  (Lt) Side Lying: Clamshells + red TB 10 x 3 sec hold  Clamshells + red band x20  Straight leg hip abd + red TB around thighs 10 x 3 sec hold Reverse clamshells + yellow TB x 10  Over & back with straight leg x10 Therapeutic Activity: Resisted side stepping + red TB crossed at ankles Seated hip flexor stretch  Supine piriformis stretch with opp leg straight Foam rolling to bil anterior thighs  Side stepping several times with red band using UE support  Grape vine x20 using UE support    [facility] Adult PT Treatment:                                                DATE: 06/26/2024 Neuromuscular re-ed: Hooklying: Hip add with yoga block squeeze 10 x 5 sec Hip abd press with black TB 10 x 5 sec Bent knee fall out + green TB x 15 (bil) (Lt) Side Lying: Clamshells + red TB 10 x 3 sec hold  Straight leg  hip abd + red TB around thighs 10 x 3 sec hold Reverse clamshells + yellow TB x 10  Over & back with straight leg x10  Therapeutic Activity: Resisted side stepping + red TB crossed at ankles Seated hip flexor stretch  Supine piriformis stretch with opp leg straight    [facility] Adult PT Treatment:                                                DATE: 06/23/24  Manual Therapy: STM and TPR Rt glutes and piriformis Neuromuscular re-ed: Sidleying hip abd with cuing to prevent compensations 2 x 10 Sidelying clam red TB 3 x 10 Sidelying reverse clam red TB x 10 --> no resistance x 10 -difficult! Therapeutic Activity: Side step red TB Backward walking red TB Sit <> stand with suitcase hold  5# x 10 bilat   [facility] Adult PT Treatment:                                                DATE: 06/19/24 Therapeutic Exercise: Supine piriformis stretch with leg straight using strap 2x30 sec  Hooklying clamshell with resistance using green band 2x20  Seated heel raises 2x10 -pt reports having past toe surgeries where she can only do heel raises in seated, not in standing or added weight   Neuromuscular re-ed: Seated ball press on lap for core activation 2x10  AROM shoulder retraction x15 Supine bridge with resistance band using green band 2x15 Self Care: Explained physiology of oxygen molecules entering into tissue while stretching  Presented picture of scapula for visual cues during AROM cervical retraction exercise Discussed how neurofascial tension relates to pain in the body  Patient education on dry needling and how it treats the impairment                                                                                                                           PATIENT EDUCATION:  Education details: HEP  Person educated: Patient Education method: Explanation, Demonstration, Tactile cues, and Verbal cues Education comprehension: verbalized understanding, returned demonstration, verbal cues required, tactile cues required, and needs further education  HOME EXERCISE PROGRAM: Access Code: [email or web address] URL: [email or web address] Date: 06/17/2024 Prepared by: Celyn Holt  Exercises - Supine Piriformis Stretch with Leg Straight  - 2 x daily - 7 x weekly - 1 sets - 3 reps - 30 sec  hold - Hooklying Clamshell with Resistance  - 1 x daily - 7 x weekly - 1 sets - 3 reps - 30 sec  hold - Supine Bridge with Resistance Band  - 2 x daily - 7 x weekly - 1-2 sets - 10 reps - 5-10 sec  hold  ASSESSMENT:  CLINICAL IMPRESSION:   Patient presents with significantly decreased pain, reporting only 1/10 today. Today's focus was on hip strengthening, progressing isometric holds. Pt  demonstrated no fatigue, was able to maintain neutral hip during dynamic motion. Required only 1 tactile cue to reposition hip while in side lying during clamshell. She would like to work on stairs at next visit.   GOALS: Goals reviewed with patient? Yes  SHORT TERM GOALS: Target date: 07/15/2024   Independent in initial HEP  Baseline: Goal status: MET on 06/30/24  2.  Patient demonstrates proper transfers sit to sidelying to supine and reverse to decrease irritation through the LB/R hip  Baseline:  Goal status: INITIAL  3.  Patient reports performing consistent HEP on a daily basis at least 5 days/week Baseline:  Goal status: INITIAL   LONG TERM GOALS: Target date: 08/12/2024    Decrease pain in LB and R hip by 50-75% allowing patient to participate in normal functional and recreational activities  Baseline:  Goal status: INITIAL  2.  Patient reports awakening with LB and R hip pain no greater than 2/10 in the morning Baseline:  Goal status: INITIAL  3.  4+/5 to 5/5 strength bilat LE's  Baseline:  Goal status: INITIAL  4.  Patient reports ability to walk for 15-20 min with minimal to no increase in baseline back and hip pain  Baseline:  Goal status: INITIAL  5.  Patient demonstrates improvement in modified oswestry score by 15%  Baseline: eval - Modified Oswestry: 20/50; 40%  Goal status: INITIAL  6.  Independent in advance HEP including aquatic therapy as indicated  Baseline:  Goal status: INITIAL  PLAN:  PT FREQUENCY: 2x/week  PT DURATION: 8 weeks  PLANNED INTERVENTIONS: 97164- PT Re-evaluation, 97110-Therapeutic exercises, 97530- Therapeutic activity, [id number]- Neuromuscular re-education, 97535- Self Care, [id number]- Manual therapy, [phone]- Aquatic Therapy, Patient/Family education, Balance training, Taping, and Joint mobilization.  PLAN FOR NEXT SESSION: review and progress with exercise program; continue back care and ergonomic education; manual work and modalities  as indicated. UPDATE HEP, Stairs    Lavanda Cleverly, [place] 06/30/24 4:37 PM

## 2024-07-02 ENCOUNTER — Other Ambulatory Visit: Payer: Self-pay

## 2024-07-02 DIAGNOSIS — E785 Hyperlipidemia, unspecified: Secondary | ICD-10-CM

## 2024-07-02 DIAGNOSIS — I10 Essential (primary) hypertension: Secondary | ICD-10-CM

## 2024-07-02 DIAGNOSIS — E1142 Type 2 diabetes mellitus with diabetic polyneuropathy: Secondary | ICD-10-CM

## 2024-07-02 DIAGNOSIS — E11649 Type 2 diabetes mellitus with hypoglycemia without coma: Secondary | ICD-10-CM

## 2024-07-02 DIAGNOSIS — R809 Proteinuria, unspecified: Secondary | ICD-10-CM

## 2024-07-03 ENCOUNTER — Telehealth: Payer: Self-pay

## 2024-07-03 ENCOUNTER — Ambulatory Visit: Payer: Self-pay | Admitting: Physical Therapy

## 2024-07-03 ENCOUNTER — Encounter: Payer: Self-pay | Admitting: Physical Therapy

## 2024-07-03 DIAGNOSIS — M545 Low back pain, unspecified: Secondary | ICD-10-CM | POA: Diagnosis not present

## 2024-07-03 DIAGNOSIS — M25551 Pain in right hip: Secondary | ICD-10-CM

## 2024-07-03 DIAGNOSIS — R2689 Other abnormalities of gait and mobility: Secondary | ICD-10-CM

## 2024-07-03 DIAGNOSIS — M6281 Muscle weakness (generalized): Secondary | ICD-10-CM | POA: Diagnosis not present

## 2024-07-03 DIAGNOSIS — R29898 Other symptoms and signs involving the musculoskeletal system: Secondary | ICD-10-CM

## 2024-07-03 DIAGNOSIS — M5459 Other low back pain: Secondary | ICD-10-CM

## 2024-07-03 DIAGNOSIS — M25552 Pain in left hip: Secondary | ICD-10-CM | POA: Diagnosis not present

## 2024-07-03 NOTE — Therapy (Addendum)
 OUTPATIENT PHYSICAL THERAPY THORACOLUMBAR TREATMENT   Patient Name: Teresa Spence MRN: [id number] DOB:1951-05-07, 73 y.o., female Today's Date: 07/03/2024  END OF SESSION:  PT End of Session - 07/03/24 0851     Visit Number 6    Number of Visits 16    Date for Recertification  08/12/24    Authorization Type aetna medicare copay $10 no auth required    Authorization - Visit Number 4    Progress Note Due on Visit 11    PT Start Time [id number]    PT Stop Time 0930    PT Time Calculation (min) 39 min    Activity Tolerance Patient tolerated treatment well    Behavior During Therapy [facility] for tasks assessed/performed            Past Medical History:  Diagnosis Date   Allergy 1975   Environmental   Arthritis 1985   Bilateral carpal tunnel syndrome 01/20/2020   Diabetes mellitus without complication (HCC)    Heart murmur 105w   Hypertension    Past Surgical History:  Procedure Laterality Date   BUNIONECTOMY Right 06/10/2019   HAMMER TOE SURGERY     JOINT REPLACEMENT  2015   MEDIAL PARTIAL KNEE REPLACEMENT     TONSILLECTOMY     Patient Active Problem List   Diagnosis Date Noted   Left leg numbness 06/23/2024   Acute right-sided low back pain without sciatica 06/02/2024   Vitamin D  deficiency 06/02/2024   Uncontrolled type 2 diabetes mellitus with hypoglycemia (HCC) 06/02/2024   Sesamoiditis of left foot 09/06/2023   Microalbuminuria 06/12/2023   Type 2 diabetes mellitus with diabetic polyneuropathy, without long-term current use of insulin (HCC) 08/28/2021   CKD (chronic kidney disease) stage 2, GFR 60-89 ml/min 08/28/2021   Hyperlipidemia LDL goal <70 08/23/2020   DDD (degenerative disc disease), cervical 12/01/2019   Fine motor skill loss 12/01/2019   Tinnitus, left 09/14/2019   Neuropathy 06/03/2019   Post-menopausal 06/03/2019   Generalized osteoarthritis of hand 09/11/2018   Hyperlipidemia associated with type 2 diabetes mellitus (HCC) 05/06/2018   Dermoid cyst of  right ear 05/06/2018   Numbness and tingling in both hands 05/08/2017   Primary osteoarthritis involving multiple joints 05/08/2017   Essential hypertension 12/27/2016   Uncontrolled type II diabetes with stage 2 chronic kidney disease 12/27/2016    PCP: Vermell LITTIE Bologna; PA-C  REFERRING PROVIDER: Vermell LITTIE Bologna; PA-C  REFERRING DIAG: Acute R sided LBP   Rationale for Evaluation and Treatment: Rehabilitation  THERAPY DIAG:  Muscle weakness (generalized)  Pain of right hip joint  Other abnormalities of gait and mobility  Other symptoms and signs involving the musculoskeletal system  Other low back pain  Pain of both hip joints  ONSET DATE: 10/12/23  SUBJECTIVE:  SUBJECTIVE STATEMENT: Pt reports coming in right knee pain in the inferior region of the patella. She believes the cause is from sitting 2 hours at a time for the class she's currently taking.    EVAL: Patient has had increased pain in the R hip and knee since February, 2025 with no known injury. Patient reports that she went to [place] in the spring and she did a lot of walking including stairs. She walked every day for 9 days. She has pain in the R hip and R knee. She braced the knee and use lidocaine on the hip. Pain is still there in the R hip and knee in the morning and decreases in the day and by the end of the day. She has no pain at the end of the day. She has had leg length difference since childhood.     PERTINENT HISTORY:  Leg length difference since childhood R LE longer about an inch compared to L. Uncontrolled type 2 DM with polyneuropathy; HTN; hyperlipidemia; vitamin D  deficiency; R sided LBP without sciatica   PAIN:  Are you having pain? Yes: NPRS scale: 4/10 Pain location: R knee;  Pain description: sharp,  stabby Aggravating factors: morning  Relieving factors: moving; stretching; heat; lidocaine roller; better as the day goes by   PRECAUTIONS: None  RED FLAGS: None   WEIGHT BEARING RESTRICTIONS: No  FALLS:  Has patient fallen in last 6 months? No  LIVING ENVIRONMENT: Lives with: lives alone  Lives in: House/apartment Stairs: Yes: External: 1 steps; none; second floor but seldom goes up to second floor  Has following equipment at home: walk in shower  OCCUPATION: retired for corporate accounting retired ~ 5 yrs ago; takes classes and is involved with various groups; sedentary; sits in soft sofa when at home - active with art   PLOF: Independent  PATIENT GOALS: decrease pain and walk with less pain   NEXT MD VISIT: 09/01/24  OBJECTIVE:  Note: Objective measures were completed at Evaluation unless otherwise noted.  DIAGNOSTIC FINDINGS:  No diagnostic test for lumbar spine  PATIENT SURVEYS:  Modified Oswestry: 20/50; 40%   Interpretation of scores: Score Category Description  0-20% Minimal Disability The patient can cope with most living activities. Usually no treatment is indicated apart from advice on lifting, sitting and exercise  21-40% Moderate Disability The patient experiences more pain and difficulty with sitting, lifting and standing. Travel and social life are more difficult and they may be disabled from work. Personal care, sexual activity and sleeping are not grossly affected, and the patient can usually be managed by conservative means  41-60% Severe Disability Pain remains the main problem in this group, but activities of daily living are affected. These patients require a detailed investigation  61-80% Crippled Back pain impinges on all aspects of the patient's life. Positive intervention is required  81-100% Bed-bound  These patients are either bed-bound or exaggerating their symptoms  Bluford FORBES Zoe DELENA Karon DELENA, et al. Surgery versus conservative management  of stable thoracolumbar fracture: the PRESTO feasibility RCT. [place] ([place]): [employer]; 2021 Nov. [facility] Technology Assessment, No. 25.62.) Appendix 3, Oswestry Disability Index category descriptors. Available from: FindJewel[employer].cz  Minimally Clinically Important Difference (MCID) = 12.8%  COGNITION: Overall cognitive status: Within functional limits for tasks assessed     SENSATION: L leg will go numb when walking on an intermittent basis - not consistent; resolves with bending forward touching ground - happens about every 2 weeks   MUSCLE LENGTH: Hamstrings:  Right 75 deg; Left 70 deg Thomas test: Right tight  > Left   POSTURE: rounded shoulders, forward head, flexed trunk , and weight shift left; R LE in hip and knee flexion; R calf atrophied   PALPATION: Tight and tender R iliacus, psoas compared to L; muscular tightness R lower lumbar/SI area; R posterior hip through the piriformis and glut med/min/obturator   LUMBAR ROM:   AROM eval  Flexion 90% wide based stance   Extension 25%  Right lateral flexion 70%  Left lateral flexion 80%  Right rotation 60%  Left rotation 45%   (Blank rows = not tested)  LOWER EXTREMITY MMT:      MMT  Right eval Left eval  Hip flexion 4 5  Hip extension 4- 4-  Hip abduction 4 4+  Hip adduction    Hip internal rotation    Hip external rotation    Knee flexion 5 5  Knee extension 5 5  Ankle dorsiflexion    Ankle plantarflexion    Ankle inversion    Ankle eversion     (Blank rows = not tested)  LOWER EXTREMITY ROM: limited end range hip extension; rotation R > L    Right eval Left eval  Hip flexion    Hip extension    Hip abduction    Hip adduction    Hip internal rotation    Hip external rotation    Knee flexion    Knee extension    Ankle dorsiflexion    Ankle plantarflexion    Ankle inversion    Ankle eversion     (Blank rows = not tested)  LUMBAR SPECIAL TESTS:  Straight  leg raise test: Negative and Slump test: Negative  FUNCTIONAL TESTS:  5 times sit to stand: 10.53 sec  SLS R 10 sec some instability: L 5 sec instability  GAIT: Distance walked: 40 feet  Assistive device utilized: None Level of assistance: Complete Independence Comments: abnormal gait pattern; Trendelenburg gait   [facility] Adult PT Treatment:                                                DATE: 07/03/24  Manual Therapy: Patellar mobilizations lat/med/inf/sup Neuromuscular re-ed: Seated LAQ with leg in extension, foot on floor x20-to do during class Ankle pumps/ calf raises x30 - to do during class  Paloff press red band x10 each side, x5 each side using verbal cue to relax shoulders, engage core  Therapeutic Activity: Supine to sitting transfer training , and it's reverse  Sit to stand several times with training on body mechanics     [facility] Adult PT Treatment:                                                DATE: 06/30/24  Neuromuscular re-ed: Hooklying: Hip add with yoga block squeeze 10 x 5 sec Hip abd press with black TB 10 x 5 sec  (Lt) Side Lying: Clamshells + red TB 10 x 3 sec hold  Clamshells + red band x20  Straight leg hip abd + red TB around thighs 10 x 3 sec hold Reverse clamshells + yellow TB x 10  Over & back with straight leg x10 Therapeutic Activity: Resisted  side stepping + red TB crossed at ankles Seated hip flexor stretch  Supine piriformis stretch with opp leg straight Foam rolling to bil anterior thighs  Side stepping several times with red band using UE support  Grape vine x20 using UE support    [facility] Adult PT Treatment:                                                DATE: 06/26/2024 Neuromuscular re-ed: Hooklying: Hip add with yoga block squeeze 10 x 5 sec Hip abd press with black TB 10 x 5 sec Bent knee fall out + green TB x 15 (bil) (Lt) Side Lying: Clamshells + red TB 10 x 3 sec hold  Straight leg hip abd + red TB around thighs 10 x 3 sec  hold Reverse clamshells + yellow TB x 10  Over & back with straight leg x10  Therapeutic Activity: Resisted side stepping + red TB crossed at ankles Seated hip flexor stretch  Supine piriformis stretch with opp leg straight    [facility] Adult PT Treatment:                                                DATE: 06/23/24  Manual Therapy: STM and TPR Rt glutes and piriformis Neuromuscular re-ed: Sidleying hip abd with cuing to prevent compensations 2 x 10 Sidelying clam red TB 3 x 10 Sidelying reverse clam red TB x 10 --> no resistance x 10 -difficult! Therapeutic Activity: Side step red TB Backward walking red TB Sit <> stand with suitcase hold 5# x 10 bilat                                                                                                                             PATIENT EDUCATION:  Education details: HEP  Person educated: Patient Education method: Explanation, Demonstration, Tactile cues, and Verbal cues Education comprehension: verbalized understanding, returned demonstration, verbal cues required, tactile cues required, and needs further education  HOME EXERCISE PROGRAM: Access Code: [email or web address] URL: [email or web address] Date: 06/17/2024 Prepared by: Celyn Holt  Exercises - Supine Piriformis Stretch with Leg Straight  - 2 x daily - 7 x weekly - 1 sets - 3 reps - 30 sec  hold - Hooklying Clamshell with Resistance  - 1 x daily - 7 x weekly - 1 sets - 3 reps - 30 sec  hold - Supine Bridge with Resistance Band  - 2 x daily - 7 x weekly - 1-2 sets - 10 reps - 5-10 sec  hold  ASSESSMENT:  CLINICAL IMPRESSION: Pt presents with right knee pain upon arrival, did patellar mobes pt tolerated well stating it helped. Focused on functional activity training  including supine to sitting/reverse transfers and sit to stands. During supine to sitting, unable to push off with crossed over arm thus redirected to use elbows for push off during leg swing off side of  table; presents with core weakness. Tolerated paloff press well, requiring verbal cues to relax shoulders and engage core instead. Implemented calf raises and LAQ for her to do during 2 hour class for knee pain prevention. Pt stated decreased knee pain at end of session.     GOALS: Goals reviewed with patient? Yes  SHORT TERM GOALS: Target date: 07/15/2024   Independent in initial HEP  Baseline: Goal status: MET on 06/30/24  2.  Patient demonstrates proper transfers sit to sidelying to supine and reverse to decrease irritation through the LB/R hip  Baseline:  Goal status: Met on 07/03/24  3.  Patient reports performing consistent HEP on a daily basis at least 5 days/week Baseline:  Goal status: INITIAL   LONG TERM GOALS: Target date: 08/12/2024    Decrease pain in LB and R hip by 50-75% allowing patient to participate in normal functional and recreational activities  Baseline:  Goal status: INITIAL  2.  Patient reports awakening with LB and R hip pain no greater than 2/10 in the morning Baseline:  Goal status: INITIAL  3.  4+/5 to 5/5 strength bilat LE's  Baseline:  Goal status: INITIAL  4.  Patient reports ability to walk for 15-20 min with minimal to no increase in baseline back and hip pain  Baseline:  Goal status: INITIAL  5.  Patient demonstrates improvement in modified oswestry score by 15%  Baseline: eval - Modified Oswestry: 20/50; 40%  Goal status: INITIAL  6.  Independent in advance HEP including aquatic therapy as indicated  Baseline:  Goal status: INITIAL  PLAN:  PT FREQUENCY: 2x/week  PT DURATION: 8 weeks  PLANNED INTERVENTIONS: 97164- PT Re-evaluation, 97110-Therapeutic exercises, 97530- Therapeutic activity, [id number]- Neuromuscular re-education, 97535- Self Care, [id number]- Manual therapy, [phone]- Aquatic Therapy, Patient/Family education, Balance training, Taping, and Joint mobilization.  PLAN FOR NEXT SESSION: review and progress with exercise program;  continue back care and ergonomic education; manual work and modalities as indicated. UPDATE HEP, Stairs , sit to stands review, paloff press   Lavanda Cleverly, SPT 07/03/24 9:58 AM   Lavanda Cleverly, SPT 07/03/24 9:58 AM

## 2024-07-03 NOTE — Telephone Encounter (Signed)
 PAP: Patient assistance application for Jardiance through [employer]) has been mailed to pt's home address on file. Provider portion of application will be faxed to provider's office.

## 2024-07-03 NOTE — Progress Notes (Signed)
   07/03/2024  Patient ID: Teresa Spence, female   DOB: [birth year], 73 y.o.   MRN: [id number]  Subjective/Objective Telephone visit to follow-up on management of chronic conditions  Diabetes: Current medications: metformin  1000mg  BID with meals, Jardiance  10mg  daily, Ozempic  0.5mg  weekly -Medications tried in the past: Farxiga  tried in the past and patient reports significant vaginal yeast infection while taking -Recently increased Ozempic  to 0.5mg  weekly and endorses tolerating well, and states heartburn has improved -Receives Jardiance  through [facility] PAP and Ozempic  through [facility] PAP -inquiring about next refill for Jardiance .  Reorder form for Ozempic  was signed by PCP on 10/29, but it is unclear if this was faxed into [facility] yet (no notes). -A1c 7.5% most recently, slight improvement from previous -Patient typically uses Libre 3 for CGM but has not worn sensor in a few weeks due to false lows at night from sleeping on area where sensor is.  Patient does have 2 more sensors at home she can use. -Has not used finger sticks to check home BG, but does have testing supplies available to do so -ACEi/ARB for cardiorenal protection:  losartan /hydrochlorothiazide 100/25mg  daily -UACR 30-300 06/02/24 -Statin for ASCVD risk reduction:  rosuvastatin  20mg  daily  Hypertension: Current medications:  losartan /hydrochlorothiazide 100/25mg  daily -Last OV BP was elevated at 135/82 on 9/23 -Celecoxib  recently increased to 200mg  BID- this medication can elevate BP and negatively affect the kidneys.  eGFR was 52 on 9/23, down from 64 in September  Hyperlipidemia Current medications:  rosuvastatin  20mg  daily -Lipid panel was done at most recent visit, LDL elevated at 113 and TG at 296 -Patient has started taking 2 tablets of her rosuvastatin  10mg  daily  Assessment/Plan:    Diabetes: -A1c, UACR, BP, LDL not at goal at this time but do reflect improvement -Continue metformin  1000mg  BID, Jardiance  10mg  daily,  and Ozempic  0.5mg  weekly -Coordinating with medication assistance team in regard to refills for Jardiance  10mg  and Ozempic  0.5mg  -Discussed upcoming changes to [facility] PAP, and patient does not qualify for [employer] Medicare Extra Help.  Plans to speak with insurance advisor to explore plans that may cover Ozempic  at an affordable copay -Goal to titrate Ozempic  and/or Jardiance  as tolerated to be able to stop metformin  -Begin monitoring BG again on a regular basis  Hypertension: -Continue current regimen at this time -If BP consistently >130/80, consider addition of amlodipine  5mg  daily.  Increasing Jardiance  to 25mg  when able may also improve BP. -Patient states celecoxib  does not seem to be providing much benefit in regard to pain/inflammation, so I recommend stopping this medication based on decline in eGFR and potential to increase BP. Advised to use APAP prn.  Recommend follow-up CMP at next OV in Dec to see if eGFR has improved since stopping celecoxib   Hyperlipidemia: -Continue current regimen of rosuvastatin  20mg  daily (using two 10mg  tabs until gone)-order pending for updated rosuvastatin  prescription, so patient can fill when 10mg  tablets are gone -I recommend lipid panel and CMP at next PCP visit to see how increased rosuvastatin  dose affects LDL and verify no changes in LFTs  Follow Up Plan:  1/15   Channing DELENA Mealing, PharmD, DPLA

## 2024-07-06 MED ORDER — EMPAGLIFLOZIN 10 MG PO TABS: 10.0000 mg | ORAL_TABLET | Freq: Every day | ORAL | 3 refills | Status: AC

## 2024-07-06 MED ORDER — ROSUVASTATIN CALCIUM 20 MG PO TABS: 20.0000 mg | ORAL_TABLET | Freq: Every day | ORAL | 1 refills | Status: AC

## 2024-07-10 NOTE — Telephone Encounter (Signed)
 Received provider portion PAP application for Jardiance  ([place])

## 2024-07-13 ENCOUNTER — Telehealth: Payer: Self-pay

## 2024-07-13 NOTE — Telephone Encounter (Signed)
 Received Ozempic  0.25/0.5mg   3 boxes  ( one pen per box) from patient assistance   Patient informed and will pick up a ather convenience in office.

## 2024-07-28 ENCOUNTER — Ambulatory Visit (INDEPENDENT_AMBULATORY_CARE_PROVIDER_SITE_OTHER)

## 2024-07-28 ENCOUNTER — Encounter: Payer: Self-pay | Admitting: Physical Therapy

## 2024-07-28 ENCOUNTER — Ambulatory Visit: Attending: Physician Assistant | Admitting: Physical Therapy

## 2024-07-28 VITALS — BP 136/75 | HR 100 | Ht 66.5 in | Wt 180.0 lb

## 2024-07-28 DIAGNOSIS — Z0000 Encounter for general adult medical examination without abnormal findings: Secondary | ICD-10-CM | POA: Diagnosis not present

## 2024-07-28 DIAGNOSIS — R29898 Other symptoms and signs involving the musculoskeletal system: Secondary | ICD-10-CM | POA: Diagnosis not present

## 2024-07-28 DIAGNOSIS — M6281 Muscle weakness (generalized): Secondary | ICD-10-CM | POA: Insufficient documentation

## 2024-07-28 DIAGNOSIS — Z13820 Encounter for screening for osteoporosis: Secondary | ICD-10-CM | POA: Diagnosis not present

## 2024-07-28 DIAGNOSIS — M5459 Other low back pain: Secondary | ICD-10-CM | POA: Diagnosis not present

## 2024-07-28 DIAGNOSIS — R2689 Other abnormalities of gait and mobility: Secondary | ICD-10-CM | POA: Diagnosis not present

## 2024-07-28 DIAGNOSIS — M25552 Pain in left hip: Secondary | ICD-10-CM | POA: Insufficient documentation

## 2024-07-28 DIAGNOSIS — M25551 Pain in right hip: Secondary | ICD-10-CM | POA: Insufficient documentation

## 2024-07-28 DIAGNOSIS — Z780 Asymptomatic menopausal state: Secondary | ICD-10-CM

## 2024-07-28 DIAGNOSIS — Z1231 Encounter for screening mammogram for malignant neoplasm of breast: Secondary | ICD-10-CM

## 2024-07-28 NOTE — Therapy (Signed)
 OUTPATIENT PHYSICAL THERAPY THORACOLUMBAR TREATMENT   Patient Name: Teresa Spence MRN: [id number] DOB:1950/12/21, 73 y.o., female Today's Date: 07/28/2024  END OF SESSION:  PT End of Session - 07/28/24 1406     Visit Number 7    Number of Visits 16    Date for Recertification  08/12/24    Authorization Type [employer]na medicare copay $10 no auth required    Authorization - Visit Number 7    Progress Note Due on Visit 11    PT Start Time 1315    PT Stop Time 1358    PT Time Calculation (min) 43 min    Activity Tolerance Patient tolerated treatment well    Behavior During Therapy WFL for tasks assessed/performed             Past Medical History:  Diagnosis Date   Allergy 1975   Environmental   Arthritis 1985   Bilateral carpal tunnel syndrome 01/20/2020   Diabetes mellitus without complication (HCC)    Heart murmur 105w   Hypertension    Past Surgical History:  Procedure Laterality Date   BUNIONECTOMY Right 06/10/2019   HAMMER TOE SURGERY     JOINT REPLACEMENT  2015   MEDIAL PARTIAL KNEE REPLACEMENT     TONSILLECTOMY     Patient Active Problem List   Diagnosis Date Noted   Left leg numbness 06/23/2024   Acute right-sided low back pain without sciatica 06/02/2024   Vitamin D  deficiency 06/02/2024   Uncontrolled type 2 diabetes mellitus with hypoglycemia (HCC) 06/02/2024   Sesamoiditis of left foot 09/06/2023   Microalbuminuria 06/12/2023   Type 2 diabetes mellitus with diabetic polyneuropathy, without long-term current use of insulin (HCC) 08/28/2021   CKD (chronic kidney disease) stage 2, GFR 60-89 ml/min 08/28/2021   Hyperlipidemia LDL goal <70 08/23/2020   DDD (degenerative disc disease), cervical 12/01/2019   Fine motor skill loss 12/01/2019   Tinnitus, left 09/14/2019   Neuropathy 06/03/2019   Post-menopausal 06/03/2019   Generalized osteoarthritis of hand 09/11/2018   Hyperlipidemia associated with type 2 diabetes mellitus (HCC) 05/06/2018   Dermoid cyst  of right ear 05/06/2018   Numbness and tingling in both hands 05/08/2017   Primary osteoarthritis involving multiple joints 05/08/2017   Essential hypertension 12/27/2016   Uncontrolled type II diabetes with stage 2 chronic kidney disease 12/27/2016    PCP: Vermell LITTIE Bologna; PA-C  REFERRING PROVIDER: Vermell LITTIE Bologna; PA-C  REFERRING DIAG: Acute R sided LBP   Rationale for Evaluation and Treatment: Rehabilitation  THERAPY DIAG:  Muscle weakness (generalized)  Pain of right hip joint  Other abnormalities of gait and mobility  ONSET DATE: 10/12/23  SUBJECTIVE:  SUBJECTIVE STATEMENT: Pt reports she has been trying different sleeping positions and knee sleeve to reduce pain in hip and knee. She states the pillow helps a little, she still has pain in the morning to mid day but that by 5-6pm she does not have any pain  EVAL: Patient has had increased pain in the R hip and knee since February, 2025 with no known injury. Patient reports that she went to [place] in the spring and she did a lot of walking including stairs. She walked every day for 9 days. She has pain in the R hip and R knee. She braced the knee and use lidocaine on the hip. Pain is still there in the R hip and knee in the morning and decreases in the day and by the end of the day. She has no pain at the end of the day. She has had leg length difference since childhood.     PERTINENT HISTORY:  Leg length difference since childhood R LE longer about an inch compared to L. Uncontrolled type 2 DM with polyneuropathy; HTN; hyperlipidemia; vitamin D  deficiency; R sided LBP without sciatica   PAIN:  Are you having pain? Yes: NPRS scale:3- 4/10 Pain location: R knee;  Pain description: sharp, stabby Aggravating factors: morning  Relieving factors:  moving; stretching; heat; lidocaine roller; better as the day goes by   PRECAUTIONS: None  RED FLAGS: None   WEIGHT BEARING RESTRICTIONS: No  FALLS:  Has patient fallen in last 6 months? No  LIVING ENVIRONMENT: Lives with: lives alone  Lives in: House/apartment Stairs: Yes: External: 1 steps; none; second floor but seldom goes up to second floor  Has following equipment at home: walk in shower  OCCUPATION: retired for corporate accounting retired ~ 5 yrs ago; takes classes and is involved with various groups; sedentary; sits in soft sofa when at home - active with art   PLOF: Independent  PATIENT GOALS: decrease pain and walk with less pain   NEXT MD VISIT: 09/01/24  OBJECTIVE:  Note: Objective measures were completed at Evaluation unless otherwise noted.  DIAGNOSTIC FINDINGS:  No diagnostic test for lumbar spine  PATIENT SURVEYS:  Modified Oswestry: 20/50; 40%   Interpretation of scores: Score Category Description  0-20% Minimal Disability The patient can cope with most living activities. Usually no treatment is indicated apart from advice on lifting, sitting and exercise  21-40% Moderate Disability The patient experiences more pain and difficulty with sitting, lifting and standing. Travel and social life are more difficult and they may be disabled from work. Personal care, sexual activity and sleeping are not grossly affected, and the patient can usually be managed by conservative means  41-60% Severe Disability Pain remains the main problem in this group, but activities of daily living are affected. These patients require a detailed investigation  61-80% Crippled Back pain impinges on all aspects of the patient's life. Positive intervention is required  81-100% Bed-bound  These patients are either bed-bound or exaggerating their symptoms  Bluford FORBES Zoe DELENA Karon DELENA, et al. Surgery versus conservative management of stable thoracolumbar fracture: the PRESTO feasibility  RCT. [place] ([place]): [employer]; 2021 Nov. [facility] Technology Assessment, No. 25.62.) Appendix 3, Oswestry Disability Index category descriptors. Available from: Findjewelers.cz  Minimally Clinically Important Difference (MCID) = 12.8%  COGNITION: Overall cognitive status: Within functional limits for tasks assessed     SENSATION: L leg will go numb when walking on an intermittent basis - not consistent; resolves with bending forward touching  ground - happens about every 2 weeks   MUSCLE LENGTH: Hamstrings: Right 75 deg; Left 70 deg Thomas test: Right tight  > Left   POSTURE: rounded shoulders, forward head, flexed trunk , and weight shift left; R LE in hip and knee flexion; R calf atrophied   PALPATION: Tight and tender R iliacus, psoas compared to L; muscular tightness R lower lumbar/SI area; R posterior hip through the piriformis and glut med/min/obturator   LUMBAR ROM:   AROM eval  Flexion 90% wide based stance   Extension 25%  Right lateral flexion 70%  Left lateral flexion 80%  Right rotation 60%  Left rotation 45%   (Blank rows = not tested)  LOWER EXTREMITY MMT:      MMT  Right eval Left eval  Hip flexion 4 5  Hip extension 4- 4-  Hip abduction 4 4+  Hip adduction    Hip internal rotation    Hip external rotation    Knee flexion 5 5  Knee extension 5 5  Ankle dorsiflexion    Ankle plantarflexion    Ankle inversion    Ankle eversion     (Blank rows = not tested)  LOWER EXTREMITY ROM: limited end range hip extension; rotation R > L    Right eval Left eval  Hip flexion    Hip extension    Hip abduction    Hip adduction    Hip internal rotation    Hip external rotation    Knee flexion    Knee extension    Ankle dorsiflexion    Ankle plantarflexion    Ankle inversion    Ankle eversion     (Blank rows = not tested)  LUMBAR SPECIAL TESTS:  Straight leg raise test: Negative and Slump test:  Negative  FUNCTIONAL TESTS:  5 times sit to stand: 10.53 sec  SLS R 10 sec some instability: L 5 sec instability  GAIT: Distance walked: 40 feet  Assistive device utilized: None Level of assistance: Complete Independence Comments: abnormal gait pattern; Trendelenburg gait   [facility] Adult PT Treatment:                                                DATE: 07/28/24 Therapeutic Exercise: LTR x 10 bilat ---> Figure 4 with LTR x 10 Bent knee fall in x 10 bilat  Neuromuscular re-ed: Hip circles on slider 2 x 10 bilat Sit <> stand x 10 focus on mechanics Therapeutic Activity: Bridge x 3 - limited by cramping in HS Supine pelvic tilt x 10 Open book x 8 bilat Sidelying clam red TB 2 x 10 Sidelying hip abd red TB 2 x 10   [facility] Adult PT Treatment:                                                DATE: 07/03/24  Manual Therapy: Patellar mobilizations lat/med/inf/sup Neuromuscular re-ed: Seated LAQ with leg in extension, foot on floor x20-to do during class Ankle pumps/ calf raises x30 - to do during class  Paloff press red band x10 each side, x5 each side using verbal cue to relax shoulders, engage core  Therapeutic Activity: Supine to sitting transfer training , and it's reverse  Sit  to stand several times with training on body mechanics     [facility] Adult PT Treatment:                                                DATE: 06/30/24  Neuromuscular re-ed: Hooklying: Hip add with yoga block squeeze 10 x 5 sec Hip abd press with black TB 10 x 5 sec  (Lt) Side Lying: Clamshells + red TB 10 x 3 sec hold  Clamshells + red band x20  Straight leg hip abd + red TB around thighs 10 x 3 sec hold Reverse clamshells + yellow TB x 10  Over & back with straight leg x10 Therapeutic Activity: Resisted side stepping + red TB crossed at ankles Seated hip flexor stretch  Supine piriformis stretch with opp leg straight Foam rolling to bil anterior thighs  Side stepping several times with red band  using UE support  Grape vine x20 using UE support    [facility] Adult PT Treatment:                                                DATE: 06/26/2024 Neuromuscular re-ed: Hooklying: Hip add with yoga block squeeze 10 x 5 sec Hip abd press with black TB 10 x 5 sec Bent knee fall out + green TB x 15 (bil) (Lt) Side Lying: Clamshells + red TB 10 x 3 sec hold  Straight leg hip abd + red TB around thighs 10 x 3 sec hold Reverse clamshells + yellow TB x 10  Over & back with straight leg x10  Therapeutic Activity: Resisted side stepping + red TB crossed at ankles Seated hip flexor stretch  Supine piriformis stretch with opp leg straight                                                                                                                          PATIENT EDUCATION:  Education details: HEP  Person educated: Patient Education method: Programmer, Multimedia, Facilities Manager, Actor cues, and Verbal cues Education comprehension: verbalized understanding, returned demonstration, verbal cues required, tactile cues required, and needs further education  HOME EXERCISE PROGRAM: Access Code: [email or web address] URL: [email or web address] Date: 07/28/2024 Prepared by: Darice Conine  Exercises - Supine Piriformis Stretch with Leg Straight  - 2 x daily - 7 x weekly - 1 sets - 3 reps - 30 sec  hold - Supine Lower Trunk Rotation  - 1 x daily - 7 x weekly - 3 sets - 10 reps - Sidelying Open Book Thoracic Lumbar Rotation and Extension  - 1 x daily - 7 x weekly - 3 sets - 10 reps - Supine Posterior Pelvic Tilt  - 1 x daily - 7 x weekly -  3 sets - 10 reps  ASSESSMENT:  CLINICAL IMPRESSION: Pt continues with pain after getting up in the morning which dissipates by early evening. Session focused on supine and sidelying mobility to help pt be less stiff in the morning. Pt tolerates session well with some cramping during bridging which eases when out of position    GOALS: Goals reviewed with patient?  Yes  SHORT TERM GOALS: Target date: 07/15/2024   Independent in initial HEP  Baseline: Goal status: MET on 06/30/24  2.  Patient demonstrates proper transfers sit to sidelying to supine and reverse to decrease irritation through the LB/R hip  Baseline:  Goal status: Met on 07/03/24  3.  Patient reports performing consistent HEP on a daily basis at least 5 days/week Baseline:  Goal status: IN PROGRESS   LONG TERM GOALS: Target date: 08/12/2024    Decrease pain in LB and R hip by 50-75% allowing patient to participate in normal functional and recreational activities  Baseline:  Goal status: INITIAL  2.  Patient reports awakening with LB and R hip pain no greater than 2/10 in the morning Baseline:  Goal status: INITIAL  3.  4+/5 to 5/5 strength bilat LE's  Baseline:  Goal status: INITIAL  4.  Patient reports ability to walk for 15-20 min with minimal to no increase in baseline back and hip pain  Baseline:  Goal status: INITIAL  5.  Patient demonstrates improvement in modified oswestry score by 15%  Baseline: eval - Modified Oswestry: 20/50; 40%  Goal status: INITIAL  6.  Independent in advance HEP including aquatic therapy as indicated  Baseline:  Goal status: INITIAL  PLAN:  PT FREQUENCY: 2x/week  PT DURATION: 8 weeks  PLANNED INTERVENTIONS: 97164- PT Re-evaluation, 97110-Therapeutic exercises, 97530- Therapeutic activity, [id number]- Neuromuscular re-education, 97535- Self Care, [id number]- Manual therapy, [phone]- Aquatic Therapy, Patient/Family education, Balance training, Taping, and Joint mobilization.  PLAN FOR NEXT SESSION: lumbar mobility, glute strength, hip mobility, GIVE UPDATED HEP ,  Darice Conine, PT,DPT11/18/252:07 PM

## 2024-07-28 NOTE — Patient Instructions (Signed)
 Teresa Spence,  Thank you for taking the time for your Medicare Wellness Visit. I appreciate your continued commitment to your health goals. Please review the care plan we discussed, and feel free to reach out if I can assist you further.  Please note that Annual Wellness Visits do not include a physical exam. Some assessments may be limited, especially if the visit was conducted virtually. If needed, we may recommend an in-person follow-up with your provider.  Ongoing Care Seeing your primary care provider every 3 to 6 months helps us  monitor your health and provide consistent, personalized care.   Referrals If a referral was made during today's visit and you haven't received any updates within two weeks, please contact the referred provider directly to check on the status.  Recommended Screenings:  Health Maintenance  Topic Date Due   Zoster (Shingles) Vaccine (2 of 2) 07/21/2021   Eye exam for diabetics  12/25/2023   COVID-19 Vaccine (3 - 2025-26 season) 05/11/2024   Medicare Annual Wellness Visit  06/09/2024   DEXA scan (bone density measurement)  07/14/2024   Hemoglobin A1C  11/30/2024   Complete foot exam   01/20/2025   Yearly kidney function blood test for diabetes  06/02/2025   Yearly kidney health urinalysis for diabetes  06/02/2025   Breast Cancer Screening  07/09/2025   Colon Cancer Screening  12/09/2028   DTaP/Tdap/Td vaccine (3 - Td or Tdap) 04/04/2029   Pneumococcal Vaccine for age over 72  Completed   Flu Shot  Completed   Hepatitis C Screening  Completed   Meningitis B Vaccine  Aged Out       07/28/2024    4:17 PM  Advanced Directives  Does Patient Have a Medical Advance Directive? Yes  Type of Estate Agent of [place];Living will  Does patient want to make changes to medical advance directive? No - Patient declined  Copy of Healthcare Power of Attorney in Chart? No - copy requested    Vision: Annual vision screenings are recommended  for early detection of glaucoma, cataracts, and diabetic retinopathy. These exams can also reveal signs of chronic conditions such as diabetes and high blood pressure.  Dental: Annual dental screenings help detect early signs of oral cancer, gum disease, and other conditions linked to overall health, including heart disease and diabetes.  Please see the attached documents for additional preventive care recommendations.

## 2024-07-28 NOTE — Progress Notes (Signed)
 Chief Complaint  Patient presents with   Medicare Wellness     Subjective:   Raynelle Fujikawa is a 73 y.o. female who presents for a Medicare Annual Wellness Visit.  Allergies (verified) Lamisil  [terbinafine ] and Lipitor Delanie.daring ]   History: Past Medical History:  Diagnosis Date   Allergy 1975   Environmental   Arthritis 1985   Bilateral carpal tunnel syndrome 01/20/2020   Diabetes mellitus without complication (HCC)    Heart murmur 105w   Hypertension    Past Surgical History:  Procedure Laterality Date   BUNIONECTOMY Right 06/10/2019   HAMMER TOE SURGERY     JOINT REPLACEMENT  2015   MEDIAL PARTIAL KNEE REPLACEMENT     TONSILLECTOMY     Family History  Problem Relation Age of Onset   Diabetes Mother    Hypertension Mother    Arthritis Mother    Hypertension Father    COPD Father    Heart attack Brother    Stroke Maternal Grandmother    Heart attack Maternal Grandfather    Emphysema Neg Hx    Social History   Occupational History   Occupation: airline pilot    Comment: retired  Tobacco Use   Smoking status: Former   Smokeless tobacco: Never  Advertising Account Planner   Vaping status: Never Used  Substance and Sexual Activity   Alcohol use: Yes    Alcohol/week: 1.0 standard drink of alcohol    Types: 1 Glasses of wine per week    Comment: 1 glass a month   Drug use: No   Sexual activity: Not Currently   Tobacco Counseling Counseling given: Not Answered  SDOH Screenings   Food Insecurity: No Food Insecurity (07/28/2024)  Housing: Low Risk  (07/28/2024)  Transportation Needs: No Transportation Needs (07/28/2024)  Utilities: Not At Risk (07/28/2024)  Alcohol Screen: Low Risk  (06/20/2024)  Depression (PHQ2-9): Low Risk  (07/28/2024)  Financial Resource Strain: Low Risk  (06/20/2024)  Physical Activity: Insufficiently Active (07/28/2024)  Social Connections: Moderately Isolated (07/28/2024)  Stress: No Stress Concern Present (07/28/2024)  Tobacco Use: Medium  Risk (07/28/2024)  Health Literacy: Adequate Health Literacy (07/28/2024)   See flowsheets for full screening details  Depression Screen PHQ 2 & 9 Depression Scale- Over the past 2 weeks, how often have you been bothered by any of the following problems? Little interest or pleasure in doing things: 0 Feeling down, depressed, or hopeless (PHQ Adolescent also includes...irritable): 0 PHQ-2 Total Score: 0 Trouble falling or staying asleep, or sleeping too much: 0 Feeling tired or having little energy: 0 Poor appetite or overeating (PHQ Adolescent also includes...weight loss): 0 Feeling bad about yourself - or that you are a failure or have let yourself or your family down: 0 Trouble concentrating on things, such as reading the newspaper or watching television (PHQ Adolescent also includes...like school work): 0 Moving or speaking so slowly that other people could have noticed. Or the opposite - being so fidgety or restless that you have been moving around a lot more than usual: 0 Thoughts that you would be better off dead, or of hurting yourself in some way: 0 PHQ-9 Total Score: 0 If you checked off any problems, how difficult have these problems made it for you to do your work, take care of things at home, or get along with other people?: Not difficult at all     Goals Addressed             This Visit's Progress    Patient Stated  Patient states she would like to walk 30 minutes 3 days a week.        Visit info / Clinical Intake: Medicare Wellness Visit Type:: Subsequent Annual Wellness Visit Persons participating in visit:: patient Medicare Wellness Visit Mode:: In-person (required for WTM) Information given by:: patient Interpreter Needed?: No Pre-visit prep was completed: yes AWV questionnaire completed by patient prior to visit?: no Living arrangements:: (!) lives alone Patient's Overall Health Status Rating: very good Typical amount of pain: some Does pain affect  daily life?: (!) yes Are you currently prescribed opioids?: no  Dietary Habits and Nutritional Risks How many meals a day?: 4 Eats fruit and vegetables daily?: yes Most meals are obtained by: preparing own meals In the last 2 weeks, have you had any of the following?: none Diabetic:: (!) yes Any non-healing wounds?: no How often do you check your BS?: continuous glucose monitor Would you like to be referred to a Nutritionist or for Diabetic Management? : no  Functional Status Activities of Daily Living (to include ambulation/medication): Independent Ambulation: Independent Medication Administration: Independent Home Management: Independent Manage your own finances?: yes Primary transportation is: driving Concerns about vision?: no *vision screening is required for WTM* Concerns about hearing?: no  Fall Screening Falls in the past year?: 0 Number of falls in past year: 0 Was there an injury with Fall?: 0 Fall Risk Category Calculator: 0 Patient Fall Risk Level: Low Fall Risk  Fall Risk Patient at Risk for Falls Due to: No Fall Risks Fall risk Follow up: Falls evaluation completed  Home and Transportation Safety: All rugs have non-skid backing?: yes All stairs or steps have railings?: yes Grab bars in the bathtub or shower?: yes Have non-skid surface in bathtub or shower?: yes Good home lighting?: yes Regular seat belt use?: yes Hospital stays in the last year:: no  Cognitive Assessment Difficulty concentrating, remembering, or making decisions? : no Will 6CIT or Mini Cog be Completed: yes What year is it?: 0 points What month is it?: 0 points Give patient an address phrase to remember (5 components): [address] [place], [place] [address] About what time is it?: 0 points Count backwards from 20 to 1: 0 points Say the months of the year in reverse: 0 points Repeat the address phrase from earlier: 0 points 6 CIT Score: 0 points  Advance Directives (For  Healthcare) Does Patient Have a Medical Advance Directive?: Yes Does patient want to make changes to medical advance directive?: No - Patient declined Type of Advance Directive: Healthcare Power of [place]; Living will Copy of Healthcare Power of Attorney in Chart?: No - copy requested Copy of Living Will in Chart?: No - copy requested  Reviewed/Updated  Reviewed/Updated: Medical History; Surgical History; Medications; Allergies; Care Teams; Patient Goals        Objective:    Today's Vitals   07/28/24 1557  BP: 136/75  Pulse: 100  SpO2: 100%  Weight: 180 lb (81.6 kg)  Height: 5' 6.5 (1.689 m)   Body mass index is 28.62 kg/m.  Current Medications (verified) Outpatient Encounter Medications as of 07/28/2024  Medication Sig   cholecalciferol (VITAMIN D3) 25 MCG (1000 UT) tablet Take 1,000 Units by mouth daily.   Continuous Blood Gluc Sensor (FREESTYLE LIBRE 3 SENSOR) MISC 1 Device by Does not apply route every 14 (fourteen) days.   cyanocobalamin (VITAMIN B12) 1000 MCG tablet Take 1,000 mcg by mouth daily.   empagliflozin  (JARDIANCE ) 10 MG TABS tablet Take 1 tablet (10 mg total) by  mouth daily.   losartan -hydrochlorothiazide (HYZAAR) 100-25 MG tablet Take 1 tablet by mouth daily.   metFORMIN  (GLUCOPHAGE ) 1000 MG tablet TAKE 1 TABLET BY MOUTH TWICE A DAY WITH A MEAL   rosuvastatin  (CRESTOR ) 20 MG tablet Take 1 tablet (20 mg total) by mouth daily.   Semaglutide ,0.25 or 0.5MG /DOS, (OZEMPIC , 0.25 OR 0.5 MG/DOSE,) 2 MG/3ML SOPN Inject 0.5 mg into the skin once a week.   celecoxib  (CELEBREX ) 200 MG capsule Take 1 capsule (200 mg total) by mouth 2 (two) times daily. (Patient not taking: Reported on 07/28/2024)   No facility-administered encounter medications on file as of 07/28/2024.   Hearing/Vision screen No results found. Immunizations and Health Maintenance Health Maintenance  Topic Date Due   Zoster Vaccines- Shingrix (2 of 2) 07/21/2021   OPHTHALMOLOGY EXAM  12/25/2023    COVID-19 Vaccine (3 - 2025-26 season) 05/11/2024   DEXA SCAN  07/14/2024   HEMOGLOBIN A1C  11/30/2024   FOOT EXAM  01/20/2025   Diabetic kidney evaluation - eGFR measurement  06/02/2025   Diabetic kidney evaluation - Urine ACR  06/02/2025   Mammogram  07/09/2025   Medicare Annual Wellness (AWV)  07/28/2025   Colonoscopy  12/09/2028   DTaP/Tdap/Td (3 - Td or Tdap) 04/04/2029   Pneumococcal Vaccine: 50+ Years  Completed   Influenza Vaccine  Completed   Hepatitis C Screening  Completed   Meningococcal B Vaccine  Aged Out        Assessment/Plan:  This is a routine wellness examination for Delories.  Patient Care Team: Breeback, [name] L, PA-C as PCP - General (Family Medicine) Deanna Channing LABOR, [facility] (Pharmacist)  I have personally reviewed and noted the following in the patient's chart:   Medical and social history Use of alcohol, tobacco or illicit drugs  Current medications and supplements including opioid prescriptions. Functional ability and status Nutritional status Physical activity Advanced directives List of other physicians Hospitalizations, surgeries, and ER visits in previous 12 months Vitals Screenings to include cognitive, depression, and falls Referrals and appointments  Orders Placed This Encounter  Procedures   MM 3D SCREENING MAMMOGRAM BILATERAL BREAST    Standing Status:   Future    Expiration Date:   07/28/2025    Reason for Exam (SYMPTOM  OR DIAGNOSIS REQUIRED):   breast cancer screening    Preferred imaging location?:   [facility]   DG Bone Density    Standing Status:   Future    Expiration Date:   07/28/2025    Reason for Exam (SYMPTOM  OR DIAGNOSIS REQUIRED):   screening for osteoporosis    Preferred imaging location?:   [facility]   In addition, I have reviewed and discussed with patient certain preventive protocols, quality metrics, and best practice recommendations. A written personalized care plan for preventive services as well  as general preventive health recommendations were provided to patient.   Bonny Jon Mayor, CMA   07/28/2024   Return in 1 year (on 07/28/2025).  After Visit Summary: (In Person-Declined) Patient declined AVS at this time.  Nurse Notes:   Dorsey Charette is a 73 y.o. female patient of [name], PA-C who had a [employer] Visit today. Jadda is Retired and lives alone. She has 1 child. She reports that she is socially active and does interact with friends/family regularly. She is moderately physically active and enjoys  reading, gardening, knitting and painting in her free time.

## 2024-07-31 ENCOUNTER — Ambulatory Visit

## 2024-07-31 DIAGNOSIS — M6281 Muscle weakness (generalized): Secondary | ICD-10-CM

## 2024-07-31 DIAGNOSIS — M25551 Pain in right hip: Secondary | ICD-10-CM

## 2024-07-31 DIAGNOSIS — R29898 Other symptoms and signs involving the musculoskeletal system: Secondary | ICD-10-CM | POA: Diagnosis not present

## 2024-07-31 DIAGNOSIS — R2689 Other abnormalities of gait and mobility: Secondary | ICD-10-CM | POA: Diagnosis not present

## 2024-07-31 DIAGNOSIS — M25552 Pain in left hip: Secondary | ICD-10-CM | POA: Diagnosis not present

## 2024-07-31 DIAGNOSIS — M5459 Other low back pain: Secondary | ICD-10-CM | POA: Diagnosis not present

## 2024-07-31 NOTE — Therapy (Signed)
 OUTPATIENT PHYSICAL THERAPY THORACOLUMBAR TREATMENT   Patient Name: Teresa Spence MRN: [id number] DOB:Jun 09, 1951, 73 y.o., female Today's Date: 07/31/2024  END OF SESSION:  PT End of Session - 07/31/24 0934     Visit Number 8    Number of Visits 16    Date for Recertification  08/12/24    Authorization Type aetna medicare copay $10 no auth required    Authorization - Visit Number 8    Progress Note Due on Visit 11    PT Start Time 0935    PT Stop Time 1015    PT Time Calculation (min) 40 min    Activity Tolerance Patient tolerated treatment well    Behavior During Therapy [facility] for tasks assessed/performed         Past Medical History:  Diagnosis Date   Allergy 1975   Environmental   Arthritis 1985   Bilateral carpal tunnel syndrome 01/20/2020   Diabetes mellitus without complication (HCC)    Heart murmur 105w   Hypertension    Past Surgical History:  Procedure Laterality Date   BUNIONECTOMY Right 06/10/2019   HAMMER TOE SURGERY     JOINT REPLACEMENT  2015   MEDIAL PARTIAL KNEE REPLACEMENT     TONSILLECTOMY     Patient Active Problem List   Diagnosis Date Noted   Left leg numbness 06/23/2024   Acute right-sided low back pain without sciatica 06/02/2024   Vitamin D  deficiency 06/02/2024   Uncontrolled type 2 diabetes mellitus with hypoglycemia (HCC) 06/02/2024   Sesamoiditis of left foot 09/06/2023   Microalbuminuria 06/12/2023   Type 2 diabetes mellitus with diabetic polyneuropathy, without long-term current use of insulin (HCC) 08/28/2021   CKD (chronic kidney disease) stage 2, GFR 60-89 ml/min 08/28/2021   Hyperlipidemia LDL goal <70 08/23/2020   DDD (degenerative disc disease), cervical 12/01/2019   Fine motor skill loss 12/01/2019   Tinnitus, left 09/14/2019   Neuropathy 06/03/2019   Post-menopausal 06/03/2019   Generalized osteoarthritis of hand 09/11/2018   Hyperlipidemia associated with type 2 diabetes mellitus (HCC) 05/06/2018   Dermoid cyst of right  ear 05/06/2018   Numbness and tingling in both hands 05/08/2017   Primary osteoarthritis involving multiple joints 05/08/2017   Essential hypertension 12/27/2016   Uncontrolled type II diabetes with stage 2 chronic kidney disease 12/27/2016    PCP: Vermell LITTIE Bologna; PA-C  REFERRING PROVIDER: Vermell LITTIE Bologna; PA-C  REFERRING DIAG: Acute R sided LBP   Rationale for Evaluation and Treatment: Rehabilitation  THERAPY DIAG:  Muscle weakness (generalized)  Pain of right hip joint  Other abnormalities of gait and mobility  Other symptoms and signs involving the musculoskeletal system  ONSET DATE: 10/12/23  SUBJECTIVE:  SUBJECTIVE STATEMENT: Patient reports she has been having problems with her foot sliding around on brake pedal; states she put on a pair of lightweight, compression that helped her feel at lot more steady. Patient states her hip is very sore today, states the rainy weather is making her more stiff all over.   EVAL: Patient has had increased pain in the R hip and knee since February, 2025 with no known injury. Patient reports that she went to [place] in the spring and she did a lot of walking including stairs. She walked every day for 9 days. She has pain in the R hip and R knee. She braced the knee and use lidocaine on the hip. Pain is still there in the R hip and knee in the morning and decreases in the day and by the end of the day. She has no pain at the end of the day. She has had leg length difference since childhood.     PERTINENT HISTORY:  Leg length difference since childhood R LE longer about an inch compared to L. Uncontrolled type 2 DM with polyneuropathy; HTN; hyperlipidemia; vitamin D  deficiency; R sided LBP without sciatica   PAIN:  Are you having pain? Yes: NPRS scale:3-  4/10 Pain location: R knee;  Pain description: sharp, stabby Aggravating factors: morning  Relieving factors: moving; stretching; heat; lidocaine roller; better as the day goes by   PRECAUTIONS: None  RED FLAGS: None   WEIGHT BEARING RESTRICTIONS: No  FALLS:  Has patient fallen in last 6 months? No  LIVING ENVIRONMENT: Lives with: lives alone  Lives in: House/apartment Stairs: Yes: External: 1 steps; none; second floor but seldom goes up to second floor  Has following equipment at home: walk in shower  OCCUPATION: retired for corporate accounting retired ~ 5 yrs ago; takes classes and is involved with various groups; sedentary; sits in soft sofa when at home - active with art   PLOF: Independent  PATIENT GOALS: decrease pain and walk with less pain   NEXT MD VISIT: 09/01/24  OBJECTIVE:  Note: Objective measures were completed at Evaluation unless otherwise noted.  DIAGNOSTIC FINDINGS:  No diagnostic test for lumbar spine  PATIENT SURVEYS:  Modified Oswestry: 20/50; 40%   Interpretation of scores: Score Category Description  0-20% Minimal Disability The patient can cope with most living activities. Usually no treatment is indicated apart from advice on lifting, sitting and exercise  21-40% Moderate Disability The patient experiences more pain and difficulty with sitting, lifting and standing. Travel and social life are more difficult and they may be disabled from work. Personal care, sexual activity and sleeping are not grossly affected, and the patient can usually be managed by conservative means  41-60% Severe Disability Pain remains the main problem in this group, but activities of daily living are affected. These patients require a detailed investigation  61-80% Crippled Back pain impinges on all aspects of the patient's life. Positive intervention is required  81-100% Bed-bound  These patients are either bed-bound or exaggerating their symptoms  Bluford FORBES Zoe DELENA Karon DELENA, et al. Surgery versus conservative management of stable thoracolumbar fracture: the PRESTO feasibility RCT. [place] ([place]): [employer]; 2021 Nov. [facility] Technology Assessment, No. 25.62.) Appendix 3, Oswestry Disability Index category descriptors. Available from: Findjewelers.cz  Minimally Clinically Important Difference (MCID) = 12.8%  COGNITION: Overall cognitive status: Within functional limits for tasks assessed     SENSATION: L leg will go numb when walking on an intermittent basis - not  consistent; resolves with bending forward touching ground - happens about every 2 weeks   MUSCLE LENGTH: Hamstrings: Right 75 deg; Left 70 deg Thomas test: Right tight  > Left   POSTURE: rounded shoulders, forward head, flexed trunk , and weight shift left; R LE in hip and knee flexion; R calf atrophied   PALPATION: Tight and tender R iliacus, psoas compared to L; muscular tightness R lower lumbar/SI area; R posterior hip through the piriformis and glut med/min/obturator   LUMBAR ROM:   AROM eval  Flexion 90% wide based stance   Extension 25%  Right lateral flexion 70%  Left lateral flexion 80%  Right rotation 60%  Left rotation 45%   (Blank rows = not tested)  LOWER EXTREMITY MMT:      MMT  Right eval Left eval  Hip flexion 4 5  Hip extension 4- 4-  Hip abduction 4 4+  Hip adduction    Hip internal rotation    Hip external rotation    Knee flexion 5 5  Knee extension 5 5  Ankle dorsiflexion    Ankle plantarflexion    Ankle inversion    Ankle eversion     (Blank rows = not tested)  LOWER EXTREMITY ROM: limited end range hip extension; rotation R > L    Right eval Left eval  Hip flexion    Hip extension    Hip abduction    Hip adduction    Hip internal rotation    Hip external rotation    Knee flexion    Knee extension    Ankle dorsiflexion    Ankle plantarflexion    Ankle inversion    Ankle eversion      (Blank rows = not tested)  LUMBAR SPECIAL TESTS:  Straight leg raise test: Negative and Slump test: Negative  FUNCTIONAL TESTS:  5 times sit to stand: 10.53 sec  SLS R 10 sec some instability: L 5 sec instability  GAIT: Distance walked: 40 feet  Assistive device utilized: None Level of assistance: Complete Independence Comments: abnormal gait pattern; Trendelenburg gait    [facility] Adult PT Treatment:                                                DATE: 07/31/2024 Therapeutic Exercise: LTR x10 --> figure 4 LTR (Rt crossed on Lt) x12 Supine hamstring stretch with strap (Rt) figure 4 alternating piriformis/glute stretch Prone quad stretch with strap (Rt) Manual Therapy: Rollerstick Rt anterior lateral quads Neuromuscular re-ed: Side Lying: Bent knee hip abd x12 Clamshells x20 Straight leg hip abd with foot propped on 2 box --> parallel, ER, IR x10 each Prone hip extension (Rt) 2x10   [facility] Adult PT Treatment:                                                DATE: 07/28/24 Therapeutic Exercise: LTR x 10 bilat ---> Figure 4 with LTR x 10 Bent knee fall in x 10 bilat  Neuromuscular re-ed: Hip circles on slider 2 x 10 bilat Sit <> stand x 10 focus on mechanics Therapeutic Activity: Bridge x 3 - limited by cramping in HS Supine pelvic tilt x 10 Open book x 8 bilat Sidelying clam red  TB 2 x 10 Sidelying hip abd red TB 2 x 10   [facility] Adult PT Treatment:                                                DATE: 07/03/24  Manual Therapy: Patellar mobilizations lat/med/inf/sup Neuromuscular re-ed: Seated LAQ with leg in extension, foot on floor x20-to do during class Ankle pumps/ calf raises x30 - to do during class  Paloff press red band x10 each side, x5 each side using verbal cue to relax shoulders, engage core  Therapeutic Activity: Supine to sitting transfer training , and it's reverse  Sit to stand several times with training on body mechanics     [facility] Adult PT Treatment:                                                 DATE: 06/30/24  Neuromuscular re-ed: Hooklying: Hip add with yoga block squeeze 10 x 5 sec Hip abd press with black TB 10 x 5 sec  (Lt) Side Lying: Clamshells + red TB 10 x 3 sec hold  Clamshells + red band x20  Straight leg hip abd + red TB around thighs 10 x 3 sec hold Reverse clamshells + yellow TB x 10  Over & back with straight leg x10 Therapeutic Activity: Resisted side stepping + red TB crossed at ankles Seated hip flexor stretch  Supine piriformis stretch with opp leg straight Foam rolling to bil anterior thighs  Side stepping several times with red band using UE support  Grape vine x20 using UE support                                                                                                                          PATIENT EDUCATION:  Education details: HEP  Person educated: Patient Education method: Programmer, Multimedia, Demonstration, Actor cues, and Verbal cues Education comprehension: verbalized understanding, returned demonstration, verbal cues required, tactile cues required, and needs further education  HOME EXERCISE PROGRAM: Access Code: [email or web address] URL: [email or web address] Date: 07/28/2024 Prepared by: Darice Conine  Exercises - Supine Piriformis Stretch with Leg Straight  - 2 x daily - 7 x weekly - 1 sets - 3 reps - 30 sec  hold - Supine Lower Trunk Rotation  - 1 x daily - 7 x weekly - 3 sets - 10 reps - Sidelying Open Book Thoracic Lumbar Rotation and Extension  - 1 x daily - 7 x weekly - 3 sets - 10 reps - Supine Posterior Pelvic Tilt  - 1 x daily - 7 x weekly - 3 sets - 10 reps  ASSESSMENT:  CLINICAL IMPRESSION: Moderate fatigue noted with hip abduction variations in side lying. Quad stretch added  at end to address tightness/tension on Rt LE. Hip extension incorporated in prone to progress glute strengthening. Patient will continue to benefit from skilled therapy to address hip pain and  strength deficits.    GOALS: Goals reviewed with patient? Yes  SHORT TERM GOALS: Target date: 07/15/2024  Independent in initial HEP  Baseline: Goal status: MET on 06/30/24  2.  Patient demonstrates proper transfers sit to sidelying to supine and reverse to decrease irritation through the LB/R hip  Baseline:  Goal status: Met on 07/03/24  3.  Patient reports performing consistent HEP on a daily basis at least 5 days/week Baseline:  Goal status: IN PROGRESS   LONG TERM GOALS: Target date: 08/12/2024   Decrease pain in LB and R hip by 50-75% allowing patient to participate in normal functional and recreational activities  Baseline:  Goal status: INITIAL  2.  Patient reports awakening with LB and R hip pain no greater than 2/10 in the morning Baseline:  Goal status: INITIAL  3.  4+/5 to 5/5 strength bilat LE's  Baseline:  Goal status: INITIAL  4.  Patient reports ability to walk for 15-20 min with minimal to no increase in baseline back and hip pain  Baseline:  Goal status: INITIAL  5.  Patient demonstrates improvement in modified oswestry score by 15%  Baseline: eval - Modified Oswestry: 20/50; 40%  Goal status: INITIAL  6.  Independent in advance HEP including aquatic therapy as indicated  Baseline:  Goal status: INITIAL  PLAN:  PT FREQUENCY: 2x/week  PT DURATION: 8 weeks  PLANNED INTERVENTIONS: 97164- PT Re-evaluation, 97110-Therapeutic exercises, 97530- Therapeutic activity, [id number]- Neuromuscular re-education, 97535- Self Care, [id number]- Manual therapy, [phone]- Aquatic Therapy, Patient/Family education, Balance training, Taping, and Joint mobilization.  PLAN FOR NEXT SESSION: lumbar mobility, glute strength, hip mobility, GIVE UPDATED HEP    Lamarr Price, PTA 07/31/24, 10:29 AM

## 2024-08-05 ENCOUNTER — Encounter: Payer: Self-pay | Admitting: Rehabilitative and Restorative Service Providers"

## 2024-08-05 ENCOUNTER — Ambulatory Visit: Payer: Self-pay | Admitting: Rehabilitative and Restorative Service Providers"

## 2024-08-05 DIAGNOSIS — M6281 Muscle weakness (generalized): Secondary | ICD-10-CM

## 2024-08-05 DIAGNOSIS — R29898 Other symptoms and signs involving the musculoskeletal system: Secondary | ICD-10-CM

## 2024-08-05 DIAGNOSIS — M5459 Other low back pain: Secondary | ICD-10-CM

## 2024-08-05 DIAGNOSIS — R2689 Other abnormalities of gait and mobility: Secondary | ICD-10-CM

## 2024-08-05 DIAGNOSIS — M25551 Pain in right hip: Secondary | ICD-10-CM | POA: Diagnosis not present

## 2024-08-05 DIAGNOSIS — M25552 Pain in left hip: Secondary | ICD-10-CM | POA: Diagnosis not present

## 2024-08-05 NOTE — Therapy (Signed)
 OUTPATIENT PHYSICAL THERAPY THORACOLUMBAR TREATMENT   Patient Name: Teresa Spence MRN: [id number] DOB:1950-11-18, 73 y.o., female Today's Date: 08/05/2024  END OF SESSION:  PT End of Session - 08/05/24 1325     Visit Number 9    Number of Visits 16    Date for Recertification  08/12/24    Authorization Type [employer]na medicare copay $10 no auth required    Authorization - Visit Number 9    Progress Note Due on Visit 11    PT Start Time 1315    PT Stop Time 1400    PT Time Calculation (min) 45 min    Activity Tolerance Patient tolerated treatment well         Past Medical History:  Diagnosis Date   Allergy 1975   Environmental   Arthritis 1985   Bilateral carpal tunnel syndrome 01/20/2020   Diabetes mellitus without complication (HCC)    Heart murmur 105w   Hypertension    Past Surgical History:  Procedure Laterality Date   BUNIONECTOMY Right 06/10/2019   HAMMER TOE SURGERY     JOINT REPLACEMENT  2015   MEDIAL PARTIAL KNEE REPLACEMENT     TONSILLECTOMY     Patient Active Problem List   Diagnosis Date Noted   Left leg numbness 06/23/2024   Acute right-sided low back pain without sciatica 06/02/2024   Vitamin D  deficiency 06/02/2024   Uncontrolled type 2 diabetes mellitus with hypoglycemia (HCC) 06/02/2024   Sesamoiditis of left foot 09/06/2023   Microalbuminuria 06/12/2023   Type 2 diabetes mellitus with diabetic polyneuropathy, without long-term current use of insulin (HCC) 08/28/2021   CKD (chronic kidney disease) stage 2, GFR 60-89 ml/min 08/28/2021   Hyperlipidemia LDL goal <70 08/23/2020   DDD (degenerative disc disease), cervical 12/01/2019   Fine motor skill loss 12/01/2019   Tinnitus, left 09/14/2019   Neuropathy 06/03/2019   Post-menopausal 06/03/2019   Generalized osteoarthritis of hand 09/11/2018   Hyperlipidemia associated with type 2 diabetes mellitus (HCC) 05/06/2018   Dermoid cyst of right ear 05/06/2018   Numbness and tingling in both hands  05/08/2017   Primary osteoarthritis involving multiple joints 05/08/2017   Essential hypertension 12/27/2016   Uncontrolled type II diabetes with stage 2 chronic kidney disease 12/27/2016    PCP: Vermell LITTIE Bologna; PA-C  REFERRING PROVIDER: Vermell LITTIE Bologna; PA-C  REFERRING DIAG: Acute R sided LBP   Rationale for Evaluation and Treatment: Rehabilitation  THERAPY DIAG:  Muscle weakness (generalized)  Pain of right hip joint  Other abnormalities of gait and mobility  Other symptoms and signs involving the musculoskeletal system  Other low back pain  Pain of both hip joints  ONSET DATE: 10/12/23  SUBJECTIVE:  SUBJECTIVE STATEMENT: Patient reports she has been using shoes that fit tighter which helps with her foot not sliding around on brake pedal. Patient is working on some exercises every day. Some days are better than other. Helps with sleeping to have pillow btn legs from hip to ankles. She reports that sometimes has numbness in L LE occurring episodically and lasting a few minutes - will resolve with rest and stretching. Seems to occur more frequently when sitting on hard chairs.   EVAL: Patient has had increased pain in the R hip and knee since February, 2025 with no known injury. Patient reports that she went to [place] in the spring and she did a lot of walking including stairs. She walked every day for 9 days. She has pain in the R hip and R knee. She braced the knee and use lidocaine on the hip. Pain is still there in the R hip and knee in the morning and decreases in the day and by the end of the day. She has no pain at the end of the day. She has had leg length difference since childhood.     PERTINENT HISTORY:  Leg length difference since childhood R LE longer about an inch compared to L.  Uncontrolled type 2 DM with polyneuropathy; HTN; hyperlipidemia; vitamin D  deficiency; R sided LBP without sciatica   PAIN:  Are you having pain? Yes: NPRS scale: 0/10 Pain location: R knee;  Pain description: sharp, stabby Aggravating factors: morning  Relieving factors: moving; stretching; heat; lidocaine roller; better as the day goes by   PRECAUTIONS: None   WEIGHT BEARING RESTRICTIONS: No  FALLS:  Has patient fallen in last 6 months? No  LIVING ENVIRONMENT: Lives with: lives alone  Lives in: House/apartment Stairs: Yes: External: 1 steps; none; second floor but seldom goes up to second floor  Has following equipment at home: walk in shower  OCCUPATION: retired for corporate accounting retired ~ 5 yrs ago; takes classes and is involved with various groups; sedentary; sits in soft sofa when at home - active with art   PATIENT GOALS: decrease pain and walk with less pain   NEXT MD VISIT: 09/01/24  OBJECTIVE:  Note: Objective measures were completed at Evaluation unless otherwise noted.  DIAGNOSTIC FINDINGS:  No diagnostic test for lumbar spine  PATIENT SURVEYS:  Modified Oswestry: 20/50; 40%   Interpretation of scores: Score Category Description  0-20% Minimal Disability The patient can cope with most living activities. Usually no treatment is indicated apart from advice on lifting, sitting and exercise  21-40% Moderate Disability The patient experiences more pain and difficulty with sitting, lifting and standing. Travel and social life are more difficult and they may be disabled from work. Personal care, sexual activity and sleeping are not grossly affected, and the patient can usually be managed by conservative means  41-60% Severe Disability Pain remains the main problem in this group, but activities of daily living are affected. These patients require a detailed investigation  61-80% Crippled Back pain impinges on all aspects of the patient's life. Positive  intervention is required  81-100% Bed-bound  These patients are either bed-bound or exaggerating their symptoms  Bluford FORBES Zoe DELENA Karon DELENA, et al. Surgery versus conservative management of stable thoracolumbar fracture: the PRESTO feasibility RCT. [place] ([place]): [employer]; 2021 Nov. [facility] Technology Assessment, No. 25.62.) Appendix 3, Oswestry Disability Index category descriptors. Available from: Findjewelers.cz  Minimally Clinically Important Difference (MCID) = 12.8%    SENSATION: L leg will  go numb when walking on an intermittent basis - not consistent; resolves with bending forward touching ground - happens about every 2 weeks   MUSCLE LENGTH: Hamstrings: Right 75 deg; Left 70 deg Thomas test: Right tight  > Left   POSTURE: rounded shoulders, forward head, flexed trunk , and weight shift left; R LE in hip and knee flexion; R calf atrophied   PALPATION: Tight and tender R iliacus, psoas compared to L; muscular tightness R lower lumbar/SI area; R posterior hip through the piriformis and glut med/min/obturator   LUMBAR ROM:   AROM eval  Flexion 90% wide based stance   Extension 25%  Right lateral flexion 70%  Left lateral flexion 80%  Right rotation 60%  Left rotation 45%   (Blank rows = not tested)  LOWER EXTREMITY MMT:      MMT  Right eval  Left eval   Hip flexion 4 4+ 5 5  Hip extension 4- 4 4- 4  Hip abduction 4 4+ 4+ 4+  Hip adduction      Hip internal rotation      Hip external rotation      Knee flexion 5  5   Knee extension 5  5   Ankle dorsiflexion      Ankle plantarflexion      Ankle inversion      Ankle eversion       (Blank rows = not tested)  LOWER EXTREMITY ROM: limited end range hip extension; rotation R > L    Right eval Left eval  Hip flexion    Hip extension    Hip abduction    Hip adduction    Hip internal rotation    Hip external rotation    Knee flexion    Knee extension    Ankle  dorsiflexion    Ankle plantarflexion    Ankle inversion    Ankle eversion     (Blank rows = not tested)  LUMBAR SPECIAL TESTS:  Straight leg raise test: Negative and Slump test: Negative  FUNCTIONAL TESTS:  5 times sit to stand: 10.53 sec  SLS R 10 sec some instability: L 5 sec instability  GAIT: Distance walked: 40 feet  Assistive device utilized: None Level of assistance: Complete Independence Comments: abnormal gait pattern; Trendelenburg gait    [facility] Adult PT Treatment:                                                DATE: 08/05/2024 Therapeutic Exercise: Prone quad stretch with strap (Rt/Lt) 30 x 2  LTR x10 --> figure 4 LTR (Rt crossed on Lt) x12 Supine hamstring stretch with strap 30 sec x 2  (Rt) figure 4 alternating piriformis/glute stretch Neuromuscular re-ed: Side Lying: Bent knee hip abd x12 Clamshells red TB x 20 Prone  Hip extension alternating LE's 2 sec x 10 R/L  Therapeutic activity  Bridge red TB distal thigh - feet further extended to avoid HS cramping 3 sec x 10  Alternating hip abduction hooklying red TB 3 sec x 10 R/L      [facility] Adult PT Treatment:  DATE: 07/31/2024 Therapeutic Exercise: LTR x10 --> figure 4 LTR (Rt crossed on Lt) x12 Supine hamstring stretch with strap (Rt) figure 4 alternating piriformis/glute stretch Prone quad stretch with strap (Rt) Manual Therapy: Rollerstick Rt anterior lateral quads Neuromuscular re-ed: Side Lying: Bent knee hip abd x12 Clamshells x20 Straight leg hip abd with foot propped on 2 box --> parallel, ER, IR x10 each Prone hip extension (Rt) 2x10   [facility] Adult PT Treatment:                                                DATE: 07/28/24 Therapeutic Exercise: LTR x 10 bilat ---> Figure 4 with LTR x 10 Bent knee fall in x 10 bilat  Neuromuscular re-ed: Hip circles on slider 2 x 10 bilat Sit <> stand x 10 focus on mechanics Therapeutic Activity: Bridge x 3 -  limited by cramping in HS Supine pelvic tilt x 10 Open book x 8 bilat Sidelying clam red TB 2 x 10 Sidelying hip abd red TB 2 x 10   [facility] Adult PT Treatment:                                                DATE: 07/03/24  Manual Therapy: Patellar mobilizations lat/med/inf/sup Neuromuscular re-ed: Seated LAQ with leg in extension, foot on floor x20-to do during class Ankle pumps/ calf raises x30 - to do during class  Paloff press red band x10 each side, x5 each side using verbal cue to relax shoulders, engage core  Therapeutic Activity: Supine to sitting transfer training , and it's reverse  Sit to stand several times with training on body mechanics                                                                                                                        PATIENT EDUCATION:  Education details: HEP  Person educated: Patient Education method: Explanation, Demonstration, Tactile cues, and Verbal cues Education comprehension: verbalized understanding, returned demonstration, verbal cues required, tactile cues required, and needs further education  HOME EXERCISE PROGRAM: Access Code: [email or web address] URL: [email or web address] Date: 08/05/2024 Prepared by: Callyn Severtson  Exercises - Supine Piriformis Stretch with Leg Straight  - 2 x daily - 7 x weekly - 1 sets - 3 reps - 30 sec  hold - Supine Lower Trunk Rotation  - 1 x daily - 7 x weekly - 3 sets - 10 reps - Sidelying Open Book Thoracic Lumbar Rotation and Extension  - 1 x daily - 7 x weekly - 3 sets - 10 reps - Clam with Resistance  - 1 x daily - 7 x weekly - 1 sets - 3 reps - 30 sec  hold - Supine Bridge with  Resistance Band  - 2 x daily - 7 x weekly - 1-2 sets - 10 reps - 5-10 sec  hold - Hooklying Isometric Clamshell  - 2 x daily - 7 x weekly - 1 sets - 10 reps - 3 sec  hold - Hooklying Hamstring Stretch with Strap  - 1 x daily - 7 x weekly - 1 sets - 3 reps - 30 sec  hold  ASSESSMENT:  CLINICAL  IMPRESSION: Shakhia returns today reporting no hip pain. She has continued to do some exercises every day but not all. She demonstrates increased LE strength with resistive testing. She has continued abnormal gait pattern. Issued revised HEP. Will benefit from continued therapy to progress with LE stretching and strengthening to address hip pain and strength deficits and improve functional activity tolerance.    GOALS: Goals reviewed with patient? Yes  SHORT TERM GOALS: Target date: 07/15/2024  Independent in initial HEP  Baseline: Goal status: MET on 06/30/24  2.  Patient demonstrates proper transfers sit to sidelying to supine and reverse to decrease irritation through the LB/R hip  Baseline:  Goal status: Met on 07/03/24  3.  Patient reports performing consistent HEP on a daily basis at least 5 days/week Baseline:  Goal status: IN PROGRESS   LONG TERM GOALS: Target date: 08/12/2024   Decrease pain in LB and R hip by 50-75% allowing patient to participate in normal functional and recreational activities  Baseline:  Goal status: INITIAL  2.  Patient reports awakening with LB and R hip pain no greater than 2/10 in the morning Baseline:  Goal status: INITIAL  3.  4+/5 to 5/5 strength bilat LE's  Baseline:  Goal status: INITIAL  4.  Patient reports ability to walk for 15-20 min with minimal to no increase in baseline back and hip pain  Baseline:  Goal status: INITIAL  5.  Patient demonstrates improvement in modified oswestry score by 15%  Baseline: eval - Modified Oswestry: 20/50; 40%  Goal status: INITIAL  6.  Independent in advance HEP including aquatic therapy as indicated  Baseline:  Goal status: INITIAL  PLAN:  PT FREQUENCY: 2x/week  PT DURATION: 8 weeks  PLANNED INTERVENTIONS: [id number]- PT Re-evaluation, 97110-Therapeutic exercises, 97530- Therapeutic activity, [id number]- Neuromuscular re-education, 97535- Self Care, [id number]- Manual therapy, [phone]- Aquatic Therapy,  Patient/Family education, Balance training, Taping, and Joint mobilization.  PLAN FOR NEXT SESSION: lumbar mobility, glute strength, hip mobility, medicare 10th visit note at next visit     Jayleon Mcfarlane P. Ina PT, MPH 08/05/24 1:26 PM

## 2024-08-10 ENCOUNTER — Encounter: Payer: Self-pay | Admitting: Rehabilitative and Restorative Service Providers"

## 2024-08-10 ENCOUNTER — Ambulatory Visit: Attending: Physician Assistant | Admitting: Rehabilitative and Restorative Service Providers"

## 2024-08-10 DIAGNOSIS — R2689 Other abnormalities of gait and mobility: Secondary | ICD-10-CM | POA: Diagnosis present

## 2024-08-10 DIAGNOSIS — M25552 Pain in left hip: Secondary | ICD-10-CM | POA: Insufficient documentation

## 2024-08-10 DIAGNOSIS — M25551 Pain in right hip: Secondary | ICD-10-CM | POA: Insufficient documentation

## 2024-08-10 DIAGNOSIS — M5459 Other low back pain: Secondary | ICD-10-CM | POA: Insufficient documentation

## 2024-08-10 DIAGNOSIS — R29898 Other symptoms and signs involving the musculoskeletal system: Secondary | ICD-10-CM | POA: Diagnosis present

## 2024-08-10 DIAGNOSIS — M6281 Muscle weakness (generalized): Secondary | ICD-10-CM | POA: Insufficient documentation

## 2024-08-10 NOTE — Therapy (Addendum)
 OUTPATIENT PHYSICAL THERAPY THORACOLUMBAR TREATMENT Medicare 10th visit note  Recert    Patient Name: Teresa Spence MRN: [id number] DOB:1951-08-06, 73 y.o., female Today's Date: 08/10/2024  END OF SESSION:  PT End of Session - 08/10/24 0803     Visit Number 10    Number of Visits 32   Date for Recertification  09/21/24    Authorization Type aetna medicare copay $10 no auth required    Authorization - Visit Number 10    Progress Note Due on Visit 20   PT Start Time 0802    PT Stop Time 0845    PT Time Calculation (min) 43 min    Activity Tolerance Patient tolerated treatment well         Past Medical History:  Diagnosis Date   Allergy 1975   Environmental   Arthritis 1985   Bilateral carpal tunnel syndrome 01/20/2020   Diabetes mellitus without complication (HCC)    Heart murmur 105w   Hypertension    Past Surgical History:  Procedure Laterality Date   BUNIONECTOMY Right 06/10/2019   HAMMER TOE SURGERY     JOINT REPLACEMENT  2015   MEDIAL PARTIAL KNEE REPLACEMENT     TONSILLECTOMY     Patient Active Problem List   Diagnosis Date Noted   Left leg numbness 06/23/2024   Acute right-sided low back pain without sciatica 06/02/2024   Vitamin D  deficiency 06/02/2024   Uncontrolled type 2 diabetes mellitus with hypoglycemia (HCC) 06/02/2024   Sesamoiditis of left foot 09/06/2023   Microalbuminuria 06/12/2023   Type 2 diabetes mellitus with diabetic polyneuropathy, without long-term current use of insulin (HCC) 08/28/2021   CKD (chronic kidney disease) stage 2, GFR 60-89 ml/min 08/28/2021   Hyperlipidemia LDL goal <70 08/23/2020   DDD (degenerative disc disease), cervical 12/01/2019   Fine motor skill loss 12/01/2019   Tinnitus, left 09/14/2019   Neuropathy 06/03/2019   Post-menopausal 06/03/2019   Generalized osteoarthritis of hand 09/11/2018   Hyperlipidemia associated with type 2 diabetes mellitus (HCC) 05/06/2018   Dermoid cyst of right ear 05/06/2018    Numbness and tingling in both hands 05/08/2017   Primary osteoarthritis involving multiple joints 05/08/2017   Essential hypertension 12/27/2016   Uncontrolled type II diabetes with stage 2 chronic kidney disease 12/27/2016    PCP: Vermell LITTIE Bologna; PA-C  REFERRING PROVIDER: Vermell LITTIE Bologna; PA-C  REFERRING DIAG: Acute R sided LBP   Rationale for Evaluation and Treatment: Rehabilitation  THERAPY DIAG:  Muscle weakness (generalized)  Pain of right hip joint  Other abnormalities of gait and mobility  Other symptoms and signs involving the musculoskeletal system  ONSET DATE: 10/12/23  SUBJECTIVE:  SUBJECTIVE STATEMENT: Patient reports she woke up with pain this morning in the R hip. Lost her pillow during the night and awoke with pain. No issues with foot sliding off pedal when driving with modifications with shoes. Patient is working on some exercises every day. Some days are better than other. Helps with sleeping to have pillow btn legs from hip to ankles. She reports that sometimes has numbness in L LE occurring episodically and lasting a few minutes - will resolve with rest and stretching. Seems to occur more frequently when sitting on hard chairs. Making progress.   EVAL: Patient has had increased pain in the R hip and knee since February, 2025 with no known injury. Patient reports that she went to [place] in the spring and she did a lot of walking including stairs. She walked every day for 9 days. She has pain in the R hip and R knee. She braced the knee and use lidocaine on the hip. Pain is still there in the R hip and knee in the morning and decreases in the day and by the end of the day. She has no pain at the end of the day. She has had leg length difference since childhood.     PERTINENT HISTORY:   Leg length difference since childhood R LE longer about an inch compared to L. Uncontrolled type 2 DM with polyneuropathy; HTN; hyperlipidemia; vitamin D  deficiency; R sided LBP without sciatica   PAIN:  Are you having pain? Yes: NPRS scale: 5/10 Pain location: R knee;  Pain description: sharp, stabby Aggravating factors: morning  Relieving factors: moving; stretching; heat; lidocaine roller; better as the day goes by   PRECAUTIONS: None   WEIGHT BEARING RESTRICTIONS: No  FALLS:  Has patient fallen in last 6 months? No  LIVING ENVIRONMENT: Lives with: lives alone  Lives in: House/apartment Stairs: Yes: External: 1 steps; none; second floor but seldom goes up to second floor  Has following equipment at home: walk in shower  OCCUPATION: retired for corporate accounting retired ~ 5 yrs ago; takes classes and is involved with various groups; sedentary; sits in soft sofa when at home - active with art   PATIENT GOALS: decrease pain and walk with less pain   NEXT MD VISIT: 09/01/24  OBJECTIVE:  Note: Objective measures were completed at Evaluation unless otherwise noted.  DIAGNOSTIC FINDINGS:  No diagnostic test for lumbar spine  PATIENT SURVEYS:  Modified Oswestry: 20/50; 40%   Interpretation of scores: Score Category Description  0-20% Minimal Disability The patient can cope with most living activities. Usually no treatment is indicated apart from advice on lifting, sitting and exercise  21-40% Moderate Disability The patient experiences more pain and difficulty with sitting, lifting and standing. Travel and social life are more difficult and they may be disabled from work. Personal care, sexual activity and sleeping are not grossly affected, and the patient can usually be managed by conservative means  41-60% Severe Disability Pain remains the main problem in this group, but activities of daily living are affected. These patients require a detailed investigation  61-80%  Crippled Back pain impinges on all aspects of the patients life. Positive intervention is required  81-100% Bed-bound  These patients are either bed-bound or exaggerating their symptoms  Bluford FORBES Zoe DELENA Karon DELENA, et al. Surgery versus conservative management of stable thoracolumbar fracture: the PRESTO feasibility RCT. [place] ([place]): [employer]; 2021 Nov. [facility] Technology Assessment, No. 25.62.) Appendix 3, Oswestry Disability Index category descriptors. Available  from: Findjewelers.cz  Minimally Clinically Important Difference (MCID) = 12.8%  08/10/24:  Modified Oswestry: 14/50; 28%    SENSATION: L leg will go numb when walking on an intermittent basis - not consistent; resolves with bending forward touching ground - happens about every 2 weeks   MUSCLE LENGTH: Hamstrings: Right 75 deg; Left 70 deg Thomas test: Right tight  > Left   POSTURE: rounded shoulders, forward head, flexed trunk , and weight shift left; R LE in hip and knee flexion; R calf atrophied   PALPATION: Tight and tender R iliacus, psoas compared to L; muscular tightness R lower lumbar/SI area; R posterior hip through the piriformis and glut med/min/obturator   LUMBAR ROM:   AROM eval 08/10/24  Flexion 90% wide based stance    Extension 25%   Right lateral flexion 70%   Left lateral flexion 80%   Right rotation 60%   Left rotation 45%    (Blank rows = not tested)  LOWER EXTREMITY MMT:      MMT  Right eval 08/10/24 Left eval 08/10/24  Hip flexion 4 4+ 5 5  Hip extension 4- 4 4- 4  Hip abduction 4 4+ 4+ 4+  Hip adduction      Hip internal rotation      Hip external rotation      Knee flexion 5  5   Knee extension 5  5   Ankle dorsiflexion      Ankle plantarflexion      Ankle inversion      Ankle eversion       (Blank rows = not tested)  LOWER EXTREMITY ROM: limited end range hip extension; rotation R > L    Right eval Left eval  Hip flexion     Hip extension    Hip abduction    Hip adduction    Hip internal rotation    Hip external rotation    Knee flexion    Knee extension    Ankle dorsiflexion    Ankle plantarflexion    Ankle inversion    Ankle eversion     (Blank rows = not tested)  LUMBAR SPECIAL TESTS:  Straight leg raise test: Negative and Slump test: Negative  FUNCTIONAL TESTS:  5 times sit to stand: 10.53 sec  SLS R 10 sec some instability: L 5 sec instability  08/10/24:  5 times sit to stand: 8.81 sec no use of UE's  SLS: R 7  sec; L 5  sec   GAIT: Distance walked: 40 feet  Assistive device utilized: None Level of assistance: Complete Independence Comments: abnormal gait pattern; Trendelenburg gait    [facility] Adult PT Treatment:                                                DATE: 08/10/2024 Therapeutic Exercise: Prone quad stretch with strap (Rt/Lt) 30 x 2  LTR x10 --> figure 4 LTR (Rt crossed on Lt) x12 Supine hamstring stretch with strap 30 sec x 2  (Rt) figure 4 alternating piriformis/glute stretch Neuromuscular re-ed: Side Lying: Bent knee hip abd x12 Clamshells green TB x 20 Supine:  Shoulder flexion holding 5# DB engaging core with green TB proximal to knees 10 x 2   Prone  Glut set 10 sec x 10  Hip extension alternating LE's 2 sec x 10 R/L  Therapeutic activity  Bridge red TB distal thigh - feet further extended to avoid HS cramping 3 sec x 10  Alternating hip abduction hooklying green TB 3 sec x 10 R/L  Clam shell supine bilat green TB 3 sec x 20    [facility] Adult PT Treatment:                                                DATE: 08/05/2024 Therapeutic Exercise: Prone quad stretch with strap (Rt/Lt) 30 x 2  LTR x10 --> figure 4 LTR (Rt crossed on Lt) x12 Supine hamstring stretch with strap 30 sec x 2  (Rt) figure 4 alternating piriformis/glute stretch Neuromuscular re-ed: Side Lying: Bent knee hip abd x12 Clamshells red TB x 20 Prone  Hip extension alternating LE's 2 sec x 10 R/L   Therapeutic activity  Bridge red TB distal thigh - feet further extended to avoid HS cramping 3 sec x 10  Alternating hip abduction hooklying red TB 3 sec x 10 R/L      [facility] Adult PT Treatment:                                                DATE: 07/31/2024 Therapeutic Exercise: LTR x10 --> figure 4 LTR (Rt crossed on Lt) x12 Supine hamstring stretch with strap (Rt) figure 4 alternating piriformis/glute stretch Prone quad stretch with strap (Rt) Manual Therapy: Rollerstick Rt anterior lateral quads Neuromuscular re-ed: Side Lying: Bent knee hip abd x12 Clamshells x20 Straight leg hip abd with foot propped on 2 box --> parallel, ER, IR x10 each Prone hip extension (Rt) 2x10   [facility] Adult PT Treatment:                                                DATE: 07/28/24 Therapeutic Exercise: LTR x 10 bilat ---> Figure 4 with LTR x 10 Bent knee fall in x 10 bilat  Neuromuscular re-ed: Hip circles on slider 2 x 10 bilat Sit <> stand x 10 focus on mechanics Therapeutic Activity: Bridge x 3 - limited by cramping in HS Supine pelvic tilt x 10 Open book x 8 bilat Sidelying clam red TB 2 x 10 Sidelying hip abd red TB 2 x 10                                                                                                                        PATIENT EDUCATION:  Education details: HEP  Person educated: Patient Education method: Explanation, Demonstration, Tactile cues, and Verbal cues Education comprehension: verbalized understanding, returned demonstration, verbal cues required, tactile cues  required, and needs further education  HOME EXERCISE PROGRAM: Access Code: [email or web address] URL: [email or web address] Date: 08/05/2024 Prepared by: Arina Torry  Exercises - Supine Piriformis Stretch with Leg Straight  - 2 x daily - 7 x weekly - 1 sets - 3 reps - 30 sec  hold - Supine Lower Trunk Rotation  - 1 x daily - 7 x weekly - 3 sets - 10 reps - Sidelying Open Book Thoracic  Lumbar Rotation and Extension  - 1 x daily - 7 x weekly - 3 sets - 10 reps - Clam with Resistance  - 1 x daily - 7 x weekly - 1 sets - 3 reps - 30 sec  hold - Supine Bridge with Resistance Band  - 2 x daily - 7 x weekly - 1-2 sets - 10 reps - 5-10 sec  hold - Hooklying Isometric Clamshell  - 2 x daily - 7 x weekly - 1 sets - 10 reps - 3 sec  hold - Hooklying Hamstring Stretch with Strap  - 1 x daily - 7 x weekly - 1 sets - 3 reps - 30 sec  hold  ASSESSMENT:  CLINICAL IMPRESSION: Star reports increased R hip pain this morning due to position during the night. She does better with use of pillow between her knees but pillow comes out during the night at times. Patient demonstrates progress with with increased LE strength and function. She has accomplished short term goals and it progressing with long term goals which remain appropriate. She has continued to do some exercises every day but not all. She has continued abnormal gait pattern. Issued revised HEP. Will benefit from continued therapy to progress with LE stretching and strengthening to address hip pain and strength deficits and improve functional activity tolerance.    GOALS: Goals reviewed with patient? Yes  SHORT TERM GOALS: Target date: 07/15/2024  Independent in initial HEP  Baseline: Goal status: MET on 06/30/24  2.  Patient demonstrates proper transfers sit to sidelying to supine and reverse to decrease irritation through the LB/R hip  Baseline:  Goal status: Met on 07/03/24  3.  Patient reports performing consistent HEP on a daily basis at least 5 days/week Baseline:  Goal status: MET 08/10/24   LONG TERM GOALS: Target date: 09/21/2024    Decrease pain in LB and R hip by 50-75% allowing patient to participate in normal functional and recreational activities  Baseline:  Goal status: on going   2.  Patient reports awakening with LB and R hip pain no greater than 2/10 in the morning Baseline:  Goal status: on going   3.   4+/5 to 5/5 strength bilat LE's  Baseline:  Goal status: partially met  4.  Patient reports ability to walk for 15-20 min with minimal to no increase in baseline back and hip pain  Baseline:  Goal status: on going   5.  Patient demonstrates improvement in modified oswestry score by 15%  Baseline: eval - Modified Oswestry: 20/50; 40%  08/10/24: 14/50; 28% Goal status: on going   6.  Independent in advance HEP including aquatic therapy as indicated  Baseline:  Goal status: on going   PLAN:  PT FREQUENCY: 2x/week  PT DURATION: 8 weeks  PLANNED INTERVENTIONS: 97164- PT Re-evaluation, 97110-Therapeutic exercises, 97530- Therapeutic activity, [id number]- Neuromuscular re-education, 97535- Self Care, [id number]- Manual therapy, [id number]- Aquatic Therapy, Patient/Family education, Balance training, Taping, and Joint mobilization.  PLAN FOR NEXT SESSION: lumbar mobility, glute strength, hip mobility   Antwan Pandya P. [employer]  PT, MPH 08/10/24 8:42 AM

## 2024-08-10 NOTE — Addendum Note (Signed)
 Addended by: INA FLORENE SQUIBB on: 08/10/2024 08:45 AM   Modules accepted: Orders

## 2024-08-12 ENCOUNTER — Ambulatory Visit: Admitting: Physical Therapy

## 2024-08-12 ENCOUNTER — Encounter: Payer: Self-pay | Admitting: Physical Therapy

## 2024-08-12 DIAGNOSIS — M5459 Other low back pain: Secondary | ICD-10-CM

## 2024-08-12 DIAGNOSIS — M6281 Muscle weakness (generalized): Secondary | ICD-10-CM | POA: Diagnosis not present

## 2024-08-12 NOTE — Therapy (Cosign Needed)
 OUTPATIENT PHYSICAL THERAPY THORACOLUMBAR TREATMENT    Patient Name: Teresa Spence MRN: [id number] DOB:1951/03/04, 73 y.o., female Today's Date: 08/12/2024  END OF SESSION:  PT End of Session - 08/12/24 1330     Visit Number 11    Number of Visits 16    Date for Recertification  09/21/24    Authorization Type [employer] medicare copay $10 no auth required    Authorization - Visit Number 11    Progress Note Due on Visit 11    PT Start Time 1328   pt arrived late   PT Stop Time 1400    PT Time Calculation (min) 32 min    Activity Tolerance Patient tolerated treatment well          Past Medical History:  Diagnosis Date   Allergy 1975   Environmental   Arthritis 1985   Bilateral carpal tunnel syndrome 01/20/2020   Diabetes mellitus without complication (HCC)    Heart murmur 105w   Hypertension    Past Surgical History:  Procedure Laterality Date   BUNIONECTOMY Right 06/10/2019   HAMMER TOE SURGERY     JOINT REPLACEMENT  2015   MEDIAL PARTIAL KNEE REPLACEMENT     TONSILLECTOMY     Patient Active Problem List   Diagnosis Date Noted   Left leg numbness 06/23/2024   Acute right-sided low back pain without sciatica 06/02/2024   Vitamin D  deficiency 06/02/2024   Uncontrolled type 2 diabetes mellitus with hypoglycemia (HCC) 06/02/2024   Sesamoiditis of left foot 09/06/2023   Microalbuminuria 06/12/2023   Type 2 diabetes mellitus with diabetic polyneuropathy, without long-term current use of insulin (HCC) 08/28/2021   CKD (chronic kidney disease) stage 2, GFR 60-89 ml/min 08/28/2021   Hyperlipidemia LDL goal <70 08/23/2020   DDD (degenerative disc disease), cervical 12/01/2019   Fine motor skill loss 12/01/2019   Tinnitus, left 09/14/2019   Neuropathy 06/03/2019   Post-menopausal 06/03/2019   Generalized osteoarthritis of hand 09/11/2018   Hyperlipidemia associated with type 2 diabetes mellitus (HCC) 05/06/2018   Dermoid cyst of right ear 05/06/2018   Numbness and  tingling in both hands 05/08/2017   Primary osteoarthritis involving multiple joints 05/08/2017   Essential hypertension 12/27/2016   Uncontrolled type II diabetes with stage 2 chronic kidney disease 12/27/2016    PCP: Vermell LITTIE Bologna; PA-C  REFERRING PROVIDER: Vermell LITTIE Bologna; PA-C  REFERRING DIAG: Acute R sided LBP   Rationale for Evaluation and Treatment: Rehabilitation  THERAPY DIAG:  Muscle weakness (generalized)  Other low back pain  ONSET DATE: 10/12/23  SUBJECTIVE:  SUBJECTIVE STATEMENT: Pt reports her cat woke her up in the middle of the night, getting up she felt lots of hip and back pain. Reports later that evening she did her HEP stretches and the pain went away. She feels better when she moves around.   EVAL: Patient has had increased pain in the R hip and knee since February, 2025 with no known injury. Patient reports that she went to [place] in the spring and she did a lot of walking including stairs. She walked every day for 9 days. She has pain in the R hip and R knee. She braced the knee and use lidocaine on the hip. Pain is still there in the R hip and knee in the morning and decreases in the day and by the end of the day. She has no pain at the end of the day. She has had leg length difference since childhood.     PERTINENT HISTORY:  Leg length difference since childhood R LE longer about an inch compared to L. Uncontrolled type 2 DM with polyneuropathy; HTN; hyperlipidemia; vitamin D  deficiency; R sided LBP without sciatica   PAIN:  Are you having pain? Yes: NPRS scale: 0/10 Pain location: R knee;  Pain description: sharp, stabby Aggravating factors: morning  Relieving factors: moving; stretching; heat; lidocaine roller; better as the day goes by   PRECAUTIONS: None   WEIGHT  BEARING RESTRICTIONS: No  FALLS:  Has patient fallen in last 6 months? No  LIVING ENVIRONMENT: Lives with: lives alone  Lives in: House/apartment Stairs: Yes: External: 1 steps; none; second floor but seldom goes up to second floor  Has following equipment at home: walk in shower  OCCUPATION: retired for corporate accounting retired ~ 5 yrs ago; takes classes and is involved with various groups; sedentary; sits in soft sofa when at home - active with art   PATIENT GOALS: decrease pain and walk with less pain   NEXT MD VISIT: 09/01/24  OBJECTIVE:  Note: Objective measures were completed at Evaluation unless otherwise noted.  DIAGNOSTIC FINDINGS:  No diagnostic test for lumbar spine  PATIENT SURVEYS:  Modified Oswestry: 20/50; 40%   Interpretation of scores: Score Category Description  0-20% Minimal Disability The patient can cope with most living activities. Usually no treatment is indicated apart from advice on lifting, sitting and exercise  21-40% Moderate Disability The patient experiences more pain and difficulty with sitting, lifting and standing. Travel and social life are more difficult and they may be disabled from work. Personal care, sexual activity and sleeping are not grossly affected, and the patient can usually be managed by conservative means  41-60% Severe Disability Pain remains the main problem in this group, but activities of daily living are affected. These patients require a detailed investigation  61-80% Crippled Back pain impinges on all aspects of the patient's life. Positive intervention is required  81-100% Bed-bound  These patients are either bed-bound or exaggerating their symptoms  Bluford FORBES Zoe DELENA Karon DELENA, et al. Surgery versus conservative management of stable thoracolumbar fracture: the PRESTO feasibility RCT. [place] ([place]): [employer]; 2021 Nov. [facility] Technology Assessment, No. 25.62.) Appendix 3, Oswestry Disability Index  category descriptors. Available from: [email or web address].cz  Minimally Clinically Important Difference (MCID) = 12.8%  08/10/24:  Modified Oswestry: 14/50; 28%    SENSATION: L leg will go numb when walking on an intermittent basis - not consistent; resolves with bending forward touching ground - happens about every 2 weeks   MUSCLE LENGTH: Hamstrings: Right  75 deg; Left 70 deg Thomas test: Right tight  > Left   POSTURE: rounded shoulders, forward head, flexed trunk , and weight shift left; R LE in hip and knee flexion; R calf atrophied   PALPATION: Tight and tender R iliacus, psoas compared to L; muscular tightness R lower lumbar/SI area; R posterior hip through the piriformis and glut med/min/obturator   LUMBAR ROM:   AROM eval 08/10/24  Flexion 90% wide based stance    Extension 25%   Right lateral flexion 70%   Left lateral flexion 80%   Right rotation 60%   Left rotation 45%    (Blank rows = not tested)  LOWER EXTREMITY MMT:      MMT  Right eval 08/10/24 Left eval 08/10/24  Hip flexion 4 4+ 5 5  Hip extension 4- 4 4- 4  Hip abduction 4 4+ 4+ 4+  Hip adduction      Hip internal rotation      Hip external rotation      Knee flexion 5  5   Knee extension 5  5   Ankle dorsiflexion      Ankle plantarflexion      Ankle inversion      Ankle eversion       (Blank rows = not tested)  LOWER EXTREMITY ROM: limited end range hip extension; rotation R > L    Right eval Left eval  Hip flexion    Hip extension    Hip abduction    Hip adduction    Hip internal rotation    Hip external rotation    Knee flexion    Knee extension    Ankle dorsiflexion    Ankle plantarflexion    Ankle inversion    Ankle eversion     (Blank rows = not tested)  LUMBAR SPECIAL TESTS:  Straight leg raise test: Negative and Slump test: Negative  FUNCTIONAL TESTS:  5 times sit to stand: 10.53 sec  SLS R 10 sec some instability: L 5 sec instability  08/10/24:   5 times sit to stand: 8.81 sec no use of UE's  SLS: R 7  sec; L 5  sec   GAIT: Distance walked: 40 feet  Assistive device utilized: None Level of assistance: Complete Independence Comments: abnormal gait pattern; Trendelenburg gait   [facility] Adult PT Treatment:                                                DATE: 08/12/24 Therapeutic Exercise: HEP review  Supine clamshells with green band 3x10 Side lying clamshells  2x10 green band BIL Neuromuscular re-ed: Transversus abdominal bracing 2x10  90/90 abdominal bracing 5 sec x10 90/90 alternating legs x20 for core stabilization with LE movement  Therapeutic Activity: Supine glute bridges 2x10  Hip hinging x10 using dowel Hip hinging + quad activation x10 Hip hinging + quad activation + half standing x10  [facility] Adult PT Treatment:                                                DATE: 08/10/2024 Therapeutic Exercise: Prone quad stretch with strap (Rt/Lt) 30 x 2  LTR x10 --> figure 4 LTR (Rt crossed on Lt) x12 Supine hamstring  stretch with strap 30 sec x 2  (Rt) figure 4 alternating piriformis/glute stretch Neuromuscular re-ed: Side Lying: Bent knee hip abd x12 Clamshells green TB x 20 Supine:  Shoulder flexion holding 5# DB engaging core with green TB proximal to knees 10 x 2   Prone  Glut set 10 sec x 10  Hip extension alternating LE's 2 sec x 10 R/L  Therapeutic activity  Bridge red TB distal thigh - feet further extended to avoid HS cramping 3 sec x 10  Alternating hip abduction hooklying green TB 3 sec x 10 R/L  Clam shell supine bilat green TB 3 sec x 20    [facility] Adult PT Treatment:                                                DATE: 08/05/2024 Therapeutic Exercise: Prone quad stretch with strap (Rt/Lt) 30 x 2  LTR x10 --> figure 4 LTR (Rt crossed on Lt) x12 Supine hamstring stretch with strap 30 sec x 2  (Rt) figure 4 alternating piriformis/glute stretch Neuromuscular re-ed: Side Lying: Bent knee hip abd  x12 Clamshells red TB x 20 Prone  Hip extension alternating LE's 2 sec x 10 R/L  Therapeutic activity  Bridge red TB distal thigh - feet further extended to avoid HS cramping 3 sec x 10  Alternating hip abduction hooklying red TB 3 sec x 10 R/L                                                                                                                            PATIENT EDUCATION:  Education details: HEP  Person educated: Patient Education method: Explanation, Demonstration, Tactile cues, and Verbal cues Education comprehension: verbalized understanding, returned demonstration, verbal cues required, tactile cues required, and needs further education  HOME EXERCISE PROGRAM: Access Code: [email or web address] URL: [email or web address] Date: 08/05/2024 Prepared by: Celyn Holt  Exercises - Supine Piriformis Stretch with Leg Straight  - 2 x daily - 7 x weekly - 1 sets - 3 reps - 30 sec  hold - Supine Lower Trunk Rotation  - 1 x daily - 7 x weekly - 3 sets - 10 reps - Sidelying Open Book Thoracic Lumbar Rotation and Extension  - 1 x daily - 7 x weekly - 3 sets - 10 reps - Clam with Resistance  - 1 x daily - 7 x weekly - 1 sets - 3 reps - 30 sec  hold - Supine Bridge with Resistance Band  - 2 x daily - 7 x weekly - 1-2 sets - 10 reps - 5-10 sec  hold - Hooklying Isometric Clamshell  - 2 x daily - 7 x weekly - 1 sets - 10 reps - 3 sec  hold - Hooklying Hamstring Stretch with Strap  - 1 x daily - 7 x weekly -  1 sets - 3 reps - 30 sec  hold  ASSESSMENT:  CLINICAL IMPRESSION: Progressed core strengthening exercises to promote lumbopelvic stability. Challenged with 90/90 alternating legs, however maintained good mechanics in lower back throughout exercise. Pt continues performing sit to stands with little hip mobility ROM during initial push off chair. Thus focused on proper sit to stand transfers, breaking down into steps using hip hinging with dowel for upright back, quad activation,  half standing, then full standing to promote improved. Required cues for upright posture, and demonstrations while performing exercise. No adverse reactions.    GOALS: Goals reviewed with patient? Yes  SHORT TERM GOALS: Target date: 07/15/2024  Independent in initial HEP  Baseline: Goal status: MET on 06/30/24  2.  Patient demonstrates proper transfers sit to sidelying to supine and reverse to decrease irritation through the LB/R hip  Baseline:  Goal status: Met on 07/03/24  3.  Patient reports performing consistent HEP on a daily basis at least 5 days/week Baseline:  Goal status: MET 08/10/24   LONG TERM GOALS: Target date: 09/21/2024    Decrease pain in LB and R hip by 50-75% allowing patient to participate in normal functional and recreational activities  Baseline:  Goal status: on going   2.  Patient reports awakening with LB and R hip pain no greater than 2/10 in the morning Baseline:  Goal status: on going   3.  4+/5 to 5/5 strength bilat LE's  Baseline:  Goal status: partially met  4.  Patient reports ability to walk for 15-20 min with minimal to no increase in baseline back and hip pain  Baseline:  Goal status: on going   5.  Patient demonstrates improvement in modified oswestry score by 15%  Baseline: eval - Modified Oswestry: 20/50; 40%  08/10/24: 14/50; 28% Goal status: on going   6.  Independent in advance HEP including aquatic therapy as indicated  Baseline:  Goal status: on going   PLAN:  PT FREQUENCY: 2x/week  PT DURATION: 8 weeks  PLANNED INTERVENTIONS: [id number]- PT Re-evaluation, 97110-Therapeutic exercises, 97530- Therapeutic activity, [id number]- Neuromuscular re-education, 97535- Self Care, [id number]- Manual therapy, [phone]- Aquatic Therapy, Patient/Family education, Balance training, Taping, and Joint mobilization.  PLAN FOR NEXT SESSION: lumbar mobility, glute strength, hip mobility, sit to stands transfers good carryover from last  session?      Lavanda Cleverly, SPT 08/12/24 4:43 PM

## 2024-08-17 ENCOUNTER — Encounter: Payer: Self-pay | Admitting: Rehabilitative and Restorative Service Providers"

## 2024-08-17 ENCOUNTER — Ambulatory Visit

## 2024-08-17 DIAGNOSIS — M25551 Pain in right hip: Secondary | ICD-10-CM

## 2024-08-17 DIAGNOSIS — R2689 Other abnormalities of gait and mobility: Secondary | ICD-10-CM

## 2024-08-17 DIAGNOSIS — R29898 Other symptoms and signs involving the musculoskeletal system: Secondary | ICD-10-CM

## 2024-08-17 DIAGNOSIS — M5459 Other low back pain: Secondary | ICD-10-CM

## 2024-08-17 DIAGNOSIS — M6281 Muscle weakness (generalized): Secondary | ICD-10-CM

## 2024-08-17 NOTE — Therapy (Signed)
 OUTPATIENT PHYSICAL THERAPY THORACOLUMBAR TREATMENT   Patient Name: Teresa Spence MRN: [id number] DOB:1950-09-18, 73 y.o., female Today's Date: 08/17/2024  END OF SESSION:  PT End of Session - 08/17/24 1317     Visit Number 12    Number of Visits 16    Date for Recertification  09/21/24    Authorization Type aetna medicare copay $10 no auth required    Authorization - Visit Number 12    Progress Note Due on Visit 20    PT Start Time 1317    PT Stop Time 1400    PT Time Calculation (min) 43 min    Activity Tolerance Patient tolerated treatment well         Past Medical History:  Diagnosis Date   Allergy 1975   Environmental   Arthritis 1985   Bilateral carpal tunnel syndrome 01/20/2020   Diabetes mellitus without complication (HCC)    Heart murmur 105w   Hypertension    Past Surgical History:  Procedure Laterality Date   BUNIONECTOMY Right 06/10/2019   HAMMER TOE SURGERY     JOINT REPLACEMENT  2015   MEDIAL PARTIAL KNEE REPLACEMENT     TONSILLECTOMY     Patient Active Problem List   Diagnosis Date Noted   Left leg numbness 06/23/2024   Acute right-sided low back pain without sciatica 06/02/2024   Vitamin D  deficiency 06/02/2024   Uncontrolled type 2 diabetes mellitus with hypoglycemia (HCC) 06/02/2024   Sesamoiditis of left foot 09/06/2023   Microalbuminuria 06/12/2023   Type 2 diabetes mellitus with diabetic polyneuropathy, without long-term current use of insulin (HCC) 08/28/2021   CKD (chronic kidney disease) stage 2, GFR 60-89 ml/min 08/28/2021   Hyperlipidemia LDL goal <70 08/23/2020   DDD (degenerative disc disease), cervical 12/01/2019   Fine motor skill loss 12/01/2019   Tinnitus, left 09/14/2019   Neuropathy 06/03/2019   Post-menopausal 06/03/2019   Generalized osteoarthritis of hand 09/11/2018   Hyperlipidemia associated with type 2 diabetes mellitus (HCC) 05/06/2018   Dermoid cyst of right ear 05/06/2018   Numbness and tingling in both hands  05/08/2017   Primary osteoarthritis involving multiple joints 05/08/2017   Essential hypertension 12/27/2016   Uncontrolled type II diabetes with stage 2 chronic kidney disease 12/27/2016    PCP: Vermell LITTIE Bologna; PA-C  REFERRING PROVIDER: Vermell LITTIE Bologna; PA-C  REFERRING DIAG: Acute R sided LBP   Rationale for Evaluation and Treatment: Rehabilitation  THERAPY DIAG:  Muscle weakness (generalized)  Other low back pain  Pain of right hip joint  Other abnormalities of gait and mobility  Other symptoms and signs involving the musculoskeletal system  Pain of both hip joints  ONSET DATE: 10/12/23  SUBJECTIVE:  SUBJECTIVE STATEMENT: Patient reports she is not having pain today. She did some exercises she saw on line. Helps with sleeping to have pillow btn legs from hip to ankles. She reports that sometimes has numbness in L LE occurring episodically and lasting a few minutes - will resolve with rest and stretching. Seems to occur more frequently when sitting on hard chairs. Making progress.   EVAL: Patient has had increased pain in the R hip and knee since February, 2025 with no known injury. Patient reports that she went to [place] in the spring and she did a lot of walking including stairs. She walked every day for 9 days. She has pain in the R hip and R knee. She braced the knee and use lidocaine on the hip. Pain is still there in the R hip and knee in the morning and decreases in the day and by the end of the day. She has no pain at the end of the day. She has had leg length difference since childhood.     PERTINENT HISTORY:  Leg length difference since childhood R LE longer about an inch compared to L. Uncontrolled type 2 DM with polyneuropathy; HTN; hyperlipidemia; vitamin D  deficiency; R sided LBP  without sciatica   PAIN:  Are you having pain? Yes: NPRS scale: 0/10 Pain location: R knee;  Pain description: sharp, stabby Aggravating factors: morning  Relieving factors: moving; stretching; heat; lidocaine roller; better as the day goes by   PRECAUTIONS: None   WEIGHT BEARING RESTRICTIONS: No  FALLS:  Has patient fallen in last 6 months? No  LIVING ENVIRONMENT: Lives with: lives alone  Lives in: House/apartment Stairs: Yes: External: 1 steps; none; second floor but seldom goes up to second floor  Has following equipment at home: walk in shower  OCCUPATION: retired for corporate accounting retired ~ 5 yrs ago; takes classes and is involved with various groups; sedentary; sits in soft sofa when at home - active with art   PATIENT GOALS: decrease pain and walk with less pain   NEXT MD VISIT: 09/01/24  OBJECTIVE:  Note: Objective measures were completed at Evaluation unless otherwise noted.  DIAGNOSTIC FINDINGS:  No diagnostic test for lumbar spine  PATIENT SURVEYS:  Modified Oswestry: 20/50; 40%   Interpretation of scores: Score Category Description  0-20% Minimal Disability The patient can cope with most living activities. Usually no treatment is indicated apart from advice on lifting, sitting and exercise  21-40% Moderate Disability The patient experiences more pain and difficulty with sitting, lifting and standing. Travel and social life are more difficult and they may be disabled from work. Personal care, sexual activity and sleeping are not grossly affected, and the patient can usually be managed by conservative means  41-60% Severe Disability Pain remains the main problem in this group, but activities of daily living are affected. These patients require a detailed investigation  61-80% Crippled Back pain impinges on all aspects of the patient's life. Positive intervention is required  81-100% Bed-bound  These patients are either bed-bound or exaggerating their  symptoms  Bluford FORBES Zoe DELENA Karon DELENA, et al. Surgery versus conservative management of stable thoracolumbar fracture: the PRESTO feasibility RCT. [place] ([place]): [employer]; 2021 Nov. [facility] Technology Assessment, No. 25.62.) Appendix 3, Oswestry Disability Index category descriptors. Available from: Findjewelers.cz  Minimally Clinically Important Difference (MCID) = 12.8%  08/10/24:  Modified Oswestry: 14/50; 28%    SENSATION: L leg will go numb when walking on an intermittent basis - not  consistent; resolves with bending forward touching ground - happens about every 2 weeks   MUSCLE LENGTH: Hamstrings: Right 75 deg; Left 70 deg Thomas test: Right tight  > Left   POSTURE: rounded shoulders, forward head, flexed trunk , and weight shift left; R LE in hip and knee flexion; R calf atrophied   PALPATION: Tight and tender R iliacus, psoas compared to L; muscular tightness R lower lumbar/SI area; R posterior hip through the piriformis and glut med/min/obturator   LUMBAR ROM:   AROM eval 08/10/24  Flexion 90% wide based stance    Extension 25%   Right lateral flexion 70%   Left lateral flexion 80%   Right rotation 60%   Left rotation 45%    (Blank rows = not tested)  LOWER EXTREMITY MMT:      MMT  Right eval 08/10/24 Left eval 08/10/24  Hip flexion 4 4+ 5 5  Hip extension 4- 4 4- 4  Hip abduction 4 4+ 4+ 4+  Hip adduction      Hip internal rotation      Hip external rotation      Knee flexion 5  5   Knee extension 5  5   Ankle dorsiflexion      Ankle plantarflexion      Ankle inversion      Ankle eversion       (Blank rows = not tested)  LOWER EXTREMITY ROM: limited end range hip extension; rotation R > L    Right eval Left eval  Hip flexion    Hip extension    Hip abduction    Hip adduction    Hip internal rotation    Hip external rotation    Knee flexion    Knee extension    Ankle dorsiflexion    Ankle  plantarflexion    Ankle inversion    Ankle eversion     (Blank rows = not tested)  LUMBAR SPECIAL TESTS:  Straight leg raise test: Negative and Slump test: Negative  FUNCTIONAL TESTS:  5 times sit to stand: 10.53 sec  SLS R 10 sec some instability: L 5 sec instability  08/10/24:  5 times sit to stand: 8.81 sec no use of UE's  SLS: R 7  sec; L 5  sec   GAIT: Distance walked: 40 feet  Assistive device utilized: None Level of assistance: Complete Independence Comments: abnormal gait pattern; Trendelenburg gait   [facility] Adult PT Treatment:                                                DATE: 08/17/2024 Therapeutic Exercise: Prone quad stretch with strap (Rt/Lt) 30 x 2  LTR x10 --> figure 4 LTR (Rt crossed on Lt) x12 Supine hamstring stretch with strap 30 sec x 2  (Rt) figure 4 alternating piriformis/glute stretch Neuromuscular re-ed: Side Lying: Bent knee hip abd black TB x 8 Supine:  Hip abduction alternating LE's black TB 3 sec hold Shoulder flexion holding 5# DB engaging core with black TB proximal to knees 10 x 2   Prone  Glut set 10 sec x 10  Hip extension alternating LE's 2 sec x 10 R/L  Therapeutic activity  Hip abduction with hips rolled forward, leading with heel to recruit glut med to fatigue  Bridge black TB distal thigh - feet further extended to avoid HS  cramping 3 sec x 10  Alternating hip abduction hooklying black TB 3 sec x 10 R/L  Clam shell supine bilat black TB 3 sec x 20     [facility] Adult PT Treatment:                                                DATE: 08/10/2024 Therapeutic Exercise: Prone quad stretch with strap (Rt/Lt) 30 x 2  LTR x10 --> figure 4 LTR (Rt crossed on Lt) x12 Supine hamstring stretch with strap 30 sec x 2  (Rt) figure 4 alternating piriformis/glute stretch Neuromuscular re-ed: Side Lying: Bent knee hip abd x12 Clamshells green TB x 20 Supine:  Shoulder flexion holding 5# DB engaging core with green TB proximal to knees 10 x 2    Prone  Glut set 10 sec x 10  Hip extension alternating LE's 2 sec x 10 R/L  Therapeutic activity  Bridge red TB distal thigh - feet further extended to avoid HS cramping 3 sec x 10  Alternating hip abduction hooklying green TB 3 sec x 10 R/L  Clam shell supine bilat green TB 3 sec x 20    [facility] Adult PT Treatment:                                                DATE: 08/05/2024 Therapeutic Exercise: Prone quad stretch with strap (Rt/Lt) 30 x 2  LTR x10 --> figure 4 LTR (Rt crossed on Lt) x12 Supine hamstring stretch with strap 30 sec x 2  (Rt) figure 4 alternating piriformis/glute stretch Neuromuscular re-ed: Side Lying: Bent knee hip abd x12 Clamshells red TB x 20 Prone  Hip extension alternating LE's 2 sec x 10 R/L  Therapeutic activity  Bridge red TB distal thigh - feet further extended to avoid HS cramping 3 sec x 10  Alternating hip abduction hooklying red TB 3 sec x 10 R/L                                                                                                                         PATIENT EDUCATION:  Education details: HEP  Person educated: Patient Education method: Explanation, Demonstration, Tactile cues, and Verbal cues Education comprehension: verbalized understanding, returned demonstration, verbal cues required, tactile cues required, and needs further education  HOME EXERCISE PROGRAM: Access Code: [email or web address] URL: [email or web address] Date: 08/05/2024 Prepared by: Naithan Delage  Exercises - Supine Piriformis Stretch with Leg Straight  - 2 x daily - 7 x weekly - 1 sets - 3 reps - 30 sec  hold - Supine Lower Trunk Rotation  - 1 x daily - 7 x weekly - 3 sets - 10 reps - Sidelying Open Book Thoracic Lumbar Rotation  and Extension  - 1 x daily - 7 x weekly - 3 sets - 10 reps - Clam with Resistance  - 1 x daily - 7 x weekly - 1 sets - 3 reps - 30 sec  hold - Supine Bridge with Resistance Band  - 2 x daily - 7 x weekly - 1-2 sets - 10 reps - 5-10  sec  hold - Hooklying Isometric Clamshell  - 2 x daily - 7 x weekly - 1 sets - 10 reps - 3 sec  hold - Hooklying Hamstring Stretch with Strap  - 1 x daily - 7 x weekly - 1 sets - 3 reps - 30 sec  hold  ASSESSMENT:  CLINICAL IMPRESSION: Eisley demonstrates progress with with increased LE strength and function. Added black TB for exercises. She has continued to do some exercises every day. She has continued abnormal gait pattern. Issued black TB to use for HEP. Will benefit from continued therapy to progress with LE stretching and strengthening to address hip pain and strength deficits and improve functional activity tolerance.    GOALS: Goals reviewed with patient? Yes  SHORT TERM GOALS: Target date: 07/15/2024  Independent in initial HEP  Baseline: Goal status: MET on 06/30/24  2.  Patient demonstrates proper transfers sit to sidelying to supine and reverse to decrease irritation through the LB/R hip  Baseline:  Goal status: Met on 07/03/24  3.  Patient reports performing consistent HEP on a daily basis at least 5 days/week Baseline:  Goal status: MET 08/10/24   LONG TERM GOALS: Target date: 09/21/2024    Decrease pain in LB and R hip by 50-75% allowing patient to participate in normal functional and recreational activities  Baseline:  Goal status: on going   2.  Patient reports awakening with LB and R hip pain no greater than 2/10 in the morning Baseline:  Goal status: on going   3.  4+/5 to 5/5 strength bilat LE's  Baseline:  Goal status: partially met  4.  Patient reports ability to walk for 15-20 min with minimal to no increase in baseline back and hip pain  Baseline:  Goal status: on going   5.  Patient demonstrates improvement in modified oswestry score by 15%  Baseline: eval - Modified Oswestry: 20/50; 40%  08/10/24: 14/50; 28% Goal status: on going   6.  Independent in advance HEP including aquatic therapy as indicated  Baseline:  Goal status: on going    PLAN:  PT FREQUENCY: 2x/week  PT DURATION: 8 weeks  PLANNED INTERVENTIONS: [id number]- PT Re-evaluation, 97110-Therapeutic exercises, 97530- Therapeutic activity, [id number]- Neuromuscular re-education, 97535- Self Care, [id number]- Manual therapy, [phone]- Aquatic Therapy, Patient/Family education, Balance training, Taping, and Joint mobilization.  PLAN FOR NEXT SESSION: lumbar mobility, glute strength, hip mobility   Averey Trompeter P. Ina PT, MPH 08/17/24 1:46 PM

## 2024-08-18 DIAGNOSIS — H524 Presbyopia: Secondary | ICD-10-CM | POA: Diagnosis not present

## 2024-08-19 ENCOUNTER — Ambulatory Visit: Admitting: Physical Therapy

## 2024-08-19 ENCOUNTER — Encounter: Payer: Self-pay | Admitting: Physical Therapy

## 2024-08-19 DIAGNOSIS — M25551 Pain in right hip: Secondary | ICD-10-CM

## 2024-08-19 DIAGNOSIS — M5459 Other low back pain: Secondary | ICD-10-CM

## 2024-08-19 DIAGNOSIS — M6281 Muscle weakness (generalized): Secondary | ICD-10-CM

## 2024-08-19 NOTE — Therapy (Signed)
 OUTPATIENT PHYSICAL THERAPY THORACOLUMBAR TREATMENT   Patient Name: Teresa Spence MRN: [id number] DOB:22-Apr-1951, 73 y.o., female Today's Date: 08/19/2024  END OF SESSION:  PT End of Session - 08/19/24 0848     Visit Number 13    Number of Visits 16    Date for Recertification  09/21/24    Authorization Type aetna medicare copay $10 no auth required    Authorization - Visit Number 13    Progress Note Due on Visit 20    PT Start Time 0848    PT Stop Time 0930    PT Time Calculation (min) 42 min    Activity Tolerance Patient tolerated treatment well          Past Medical History:  Diagnosis Date   Allergy 1975   Environmental   Arthritis 1985   Bilateral carpal tunnel syndrome 01/20/2020   Diabetes mellitus without complication (HCC)    Heart murmur 105w   Hypertension    Past Surgical History:  Procedure Laterality Date   BUNIONECTOMY Right 06/10/2019   HAMMER TOE SURGERY     JOINT REPLACEMENT  2015   MEDIAL PARTIAL KNEE REPLACEMENT     TONSILLECTOMY     Patient Active Problem List   Diagnosis Date Noted   Left leg numbness 06/23/2024   Acute right-sided low back pain without sciatica 06/02/2024   Vitamin D  deficiency 06/02/2024   Uncontrolled type 2 diabetes mellitus with hypoglycemia (HCC) 06/02/2024   Sesamoiditis of left foot 09/06/2023   Microalbuminuria 06/12/2023   Type 2 diabetes mellitus with diabetic polyneuropathy, without long-term current use of insulin (HCC) 08/28/2021   CKD (chronic kidney disease) stage 2, GFR 60-89 ml/min 08/28/2021   Hyperlipidemia LDL goal <70 08/23/2020   DDD (degenerative disc disease), cervical 12/01/2019   Fine motor skill loss 12/01/2019   Tinnitus, left 09/14/2019   Neuropathy 06/03/2019   Post-menopausal 06/03/2019   Generalized osteoarthritis of hand 09/11/2018   Hyperlipidemia associated with type 2 diabetes mellitus (HCC) 05/06/2018   Dermoid cyst of right ear 05/06/2018   Numbness and tingling in both hands  05/08/2017   Primary osteoarthritis involving multiple joints 05/08/2017   Essential hypertension 12/27/2016   Uncontrolled type II diabetes with stage 2 chronic kidney disease 12/27/2016    PCP: Vermell LITTIE Bologna; PA-C  REFERRING PROVIDER: Vermell LITTIE Bologna; PA-C  REFERRING DIAG: Acute R sided LBP   Rationale for Evaluation and Treatment: Rehabilitation  THERAPY DIAG:  Other low back pain  Pain of right hip joint  Muscle weakness (generalized)  ONSET DATE: 10/12/23  SUBJECTIVE:  SUBJECTIVE STATEMENT: Pt reports 5/10 pain she got after last session in using the black TB for hip strengthening exercises. She has been sore ever since.   EVAL: Patient has had increased pain in the R hip and knee since February, 2025 with no known injury. Patient reports that she went to [place] in the spring and she did a lot of walking including stairs. She walked every day for 9 days. She has pain in the R hip and R knee. She braced the knee and use lidocaine on the hip. Pain is still there in the R hip and knee in the morning and decreases in the day and by the end of the day. She has no pain at the end of the day. She has had leg length difference since childhood.     PERTINENT HISTORY:  Leg length difference since childhood R LE longer about an inch compared to L. Uncontrolled type 2 DM with polyneuropathy; HTN; hyperlipidemia; vitamin D  deficiency; R sided LBP without sciatica   PAIN:  Are you having pain? Yes: NPRS scale: 5/10 Pain location: R knee;  Pain description: sharp, stabby Aggravating factors: morning  Relieving factors: moving; stretching; heat; lidocaine roller; better as the day goes by   PRECAUTIONS: None   WEIGHT BEARING RESTRICTIONS: No  FALLS:  Has patient fallen in last 6 months? No  LIVING  ENVIRONMENT: Lives with: lives alone  Lives in: House/apartment Stairs: Yes: External: 1 steps; none; second floor but seldom goes up to second floor  Has following equipment at home: walk in shower  OCCUPATION: retired for corporate accounting retired ~ 5 yrs ago; takes classes and is involved with various groups; sedentary; sits in soft sofa when at home - active with art   PATIENT GOALS: decrease pain and walk with less pain   NEXT MD VISIT: 09/01/24  OBJECTIVE:  Note: Objective measures were completed at Evaluation unless otherwise noted.  DIAGNOSTIC FINDINGS:  No diagnostic test for lumbar spine  PATIENT SURVEYS:  Modified Oswestry: 20/50; 40%   Interpretation of scores: Score Category Description  0-20% Minimal Disability The patient can cope with most living activities. Usually no treatment is indicated apart from advice on lifting, sitting and exercise  21-40% Moderate Disability The patient experiences more pain and difficulty with sitting, lifting and standing. Travel and social life are more difficult and they may be disabled from work. Personal care, sexual activity and sleeping are not grossly affected, and the patient can usually be managed by conservative means  41-60% Severe Disability Pain remains the main problem in this group, but activities of daily living are affected. These patients require a detailed investigation  61-80% Crippled Back pain impinges on all aspects of the patients life. Positive intervention is required  81-100% Bed-bound  These patients are either bed-bound or exaggerating their symptoms  Bluford FORBES Zoe DELENA Karon DELENA, et al. Surgery versus conservative management of stable thoracolumbar fracture: the PRESTO feasibility RCT. [place] ([place]): [employer]; 2021 Nov. [facility] Technology Assessment, No. 25.62.) Appendix 3, Oswestry Disability Index category descriptors. Available from:  [email or web address].cz  Minimally Clinically Important Difference (MCID) = 12.8%  08/10/24:  Modified Oswestry: 14/50; 28%    SENSATION: L leg will go numb when walking on an intermittent basis - not consistent; resolves with bending forward touching ground - happens about every 2 weeks   MUSCLE LENGTH: Hamstrings: Right 75 deg; Left 70 deg Thomas test: Right tight  > Left   POSTURE: rounded shoulders, forward head, flexed  trunk , and weight shift left; R LE in hip and knee flexion; R calf atrophied   PALPATION: Tight and tender R iliacus, psoas compared to L; muscular tightness R lower lumbar/SI area; R posterior hip through the piriformis and glut med/min/obturator   LUMBAR ROM:   AROM eval 08/10/24  Flexion 90% wide based stance    Extension 25%   Right lateral flexion 70%   Left lateral flexion 80%   Right rotation 60%   Left rotation 45%    (Blank rows = not tested)  LOWER EXTREMITY MMT:      MMT  Right eval 08/10/24 Left eval 08/10/24  Hip flexion 4 4+ 5 5  Hip extension 4- 4 4- 4  Hip abduction 4 4+ 4+ 4+  Hip adduction      Hip internal rotation      Hip external rotation      Knee flexion 5  5   Knee extension 5  5   Ankle dorsiflexion      Ankle plantarflexion      Ankle inversion      Ankle eversion       (Blank rows = not tested)  LOWER EXTREMITY ROM: limited end range hip extension; rotation R > L    Right eval Left eval  Hip flexion    Hip extension    Hip abduction    Hip adduction    Hip internal rotation    Hip external rotation    Knee flexion    Knee extension    Ankle dorsiflexion    Ankle plantarflexion    Ankle inversion    Ankle eversion     (Blank rows = not tested)  LUMBAR SPECIAL TESTS:  Straight leg raise test: Negative and Slump test: Negative  FUNCTIONAL TESTS:  5 times sit to stand: 10.53 sec  SLS R 10 sec some instability: L 5 sec instability  08/10/24:  5 times sit to stand: 8.81 sec no use  of UE's  SLS: R 7  sec; L 5  sec   GAIT: Distance walked: 40 feet  Assistive device utilized: None Level of assistance: Complete Independence Comments: abnormal gait pattern; Trendelenburg gait  [facility] Adult PT Treatment:                                                DATE: 08/19/24 Therapeutic Exercise: HEP review  Updated HEP Piriformis stretch in supine figure 4 alternating with glute stretch Rt side only 4x30 sec  LTR x20 with legs together moving through the motion together  Hamstring stretch in supine 2x 30 sec BIL with strap  Neuromuscular re-ed: Supine sciatic nerve mobilization with strap 20x BIL , one leg at a time-really liked Supine sciatic nerve mobilization pillows stacked at 4 in/10 cm in height BIL simultaneously  Pelvic tilts 2x20 -flatten back into table cue works well  90/90 abdominal bracing for 1-2 sec hold 2x5  Therapeutic Activity: Sit to stands x10 - emphasized to increase hip ROM during hip hinge motion of transfer for gaining more momentum to full standing  [facility] Adult PT Treatment:  DATE: 08/17/2024 Therapeutic Exercise: Prone quad stretch with strap (Rt/Lt) 30 x 2  LTR x10 --> figure 4 LTR (Rt crossed on Lt) x12 Supine hamstring stretch with strap 30 sec x 2  (Rt) figure 4 alternating piriformis/glute stretch Neuromuscular re-ed: Side Lying: Bent knee hip abd black TB x 8 Supine:  Hip abduction alternating LE's black TB 3 sec hold Shoulder flexion holding 5# DB engaging core with black TB proximal to knees 10 x 2   Prone  Glut set 10 sec x 10  Hip extension alternating LE's 2 sec x 10 R/L  Therapeutic activity  Hip abduction with hips rolled forward, leading with heel to recruit glut med to fatigue  Bridge black TB distal thigh - feet further extended to avoid HS cramping 3 sec x 10  Alternating hip abduction hooklying black TB 3 sec x 10 R/L  Clam shell supine bilat black TB 3 sec x 20     [facility]  Adult PT Treatment:                                                DATE: 08/10/2024 Therapeutic Exercise: Prone quad stretch with strap (Rt/Lt) 30 x 2  LTR x10 --> figure 4 LTR (Rt crossed on Lt) x12 Supine hamstring stretch with strap 30 sec x 2  (Rt) figure 4 alternating piriformis/glute stretch Neuromuscular re-ed: Side Lying: Bent knee hip abd x12 Clamshells green TB x 20 Supine:  Shoulder flexion holding 5# DB engaging core with green TB proximal to knees 10 x 2   Prone  Glut set 10 sec x 10  Hip extension alternating LE's 2 sec x 10 R/L  Therapeutic activity  Bridge red TB distal thigh - feet further extended to avoid HS cramping 3 sec x 10  Alternating hip abduction hooklying green TB 3 sec x 10 R/L  Clam shell supine bilat green TB 3 sec x 20                                                                                                                           PATIENT EDUCATION:  Education details: See treatment  Person educated: Patient Education method: Explanation, Demonstration, Tactile cues, and Verbal cueshandout  Education comprehension: verbalized understanding, returned demonstration, verbal cues required, tactile cues required, and needs further education  HOME EXERCISE PROGRAM: Access Code: [email or web address] URL: [email or web address] Date: 08/19/2024 Prepared by: Lavanda Cleverly  Exercises - Supine Piriformis Stretch with Leg Straight  - 2 x daily - 7 x weekly - 1 sets - 3 reps - 30 sec  hold - Supine Lower Trunk Rotation  - 1 x daily - 7 x weekly - 3 sets - 10 reps - Sidelying Open Book Thoracic Lumbar Rotation and Extension  - 1 x daily - 7 x weekly - 3 sets - 10 reps -  Clam with Resistance  - 1 x daily - 7 x weekly - 1 sets - 3 reps - 30 sec  hold - Supine Bridge with Resistance Band  - 2 x daily - 7 x weekly - 1-2 sets - 10 reps - 5-10 sec  hold - Hooklying Isometric Clamshell  - 2 x daily - 7 x weekly - 1 sets - 10 reps - 3 sec  hold -  Hooklying Hamstring Stretch with Strap  - 1 x daily - 7 x weekly - 1 sets - 3 reps - 30 sec  hold - Supine Sciatic Nerve Mobilization with Strap  - 1 x daily - 7 x weekly - 3 sets - 10 reps - Supine Sciatic Nerve Mobilization With Leg on Pillow  - 1 x daily - 7 x weekly - 3 sets - 10 reps - Supine Posterior Pelvic Tilt  - 1 x daily - 7 x weekly - 3 sets - 10 reps - Supine 90/90 Abdominal Bracing  - 1 x daily - 7 x weekly - 3 sets - 10 reps  CLINICAL IMPRESSION: Pt arrived reporting 5/10 pain after last session in using the black TB for hip strengthening, thus focused on recovery state today with stretches in supine. Introduced sciatic nerve mobilization with strap, pt tolerated well, responding with positive feedback. Also trialed the same nerve mobe but without strap, adding pillows stacked under knee to ankles that she also tolerated well. Pt states she arrived today with 5/10 pain however after the stretches and esp nerve mobes, reports 0/10 pain. Provided printed hand out of updated HEP with nerve glides, recommending to complete frequently throughout the week. Did well with 90/90, requiring verbal cues to first do pelvic tilt (core engagement) then lift legs and hold. Trialed 5 sec hold but too much, thus regressed to 1-2 sec hold and tolerated well. Demonstrates good carryover with sit to stands with increasing hip ROM during hip hinging motion to gain more momentum for transfer into standing.    GOALS: Goals reviewed with patient? Yes  SHORT TERM GOALS: Target date: 07/15/2024  Independent in initial HEP  Baseline: Goal status: MET on 06/30/24  2.  Patient demonstrates proper transfers sit to sidelying to supine and reverse to decrease irritation through the LB/R hip  Baseline:  Goal status: Met on 07/03/24  3.  Patient reports performing consistent HEP on a daily basis at least 5 days/week Baseline:  Goal status: MET 08/10/24   LONG TERM GOALS: Target date: 09/21/2024    Decrease  pain in LB and R hip by 50-75% allowing patient to participate in normal functional and recreational activities  Baseline:  Goal status: on going   2.  Patient reports awakening with LB and R hip pain no greater than 2/10 in the morning Baseline:  Goal status: on going   3.  4+/5 to 5/5 strength bilat LE's  Baseline:  Goal status: partially met  4.  Patient reports ability to walk for 15-20 min with minimal to no increase in baseline back and hip pain  Baseline:  Goal status: on going   5.  Patient demonstrates improvement in modified oswestry score by 15%  Baseline: eval - Modified Oswestry: 20/50; 40%  08/10/24: 14/50; 28% Goal status: on going   6.  Independent in advance HEP including aquatic therapy as indicated  Baseline:  Goal status: on going   PLAN:  PT FREQUENCY: 2x/week  PT DURATION: 8 weeks  PLANNED INTERVENTIONS: 97164- PT Re-evaluation, 97110-Therapeutic  exercises, 97530- Therapeutic activity, [id number]- Neuromuscular re-education, [phone]- Self Care, [id number]- Manual therapy, [phone]- Aquatic Therapy, Patient/Family education, Balance training, Taping, and Joint mobilization.  PLAN FOR NEXT SESSION: lumbar mobility, glute strength, hip mobility Sciatic nerve mobilizations! Do more nerve glides-responded very well, stretching is still important to do for pain relief, progress core engagement,progress core engagement with LE movement ,  Review proper sit to stands, how did nerve glides go at home??   Lavanda Cleverly, SPT 08/19/24 9:57 AM

## 2024-08-25 ENCOUNTER — Encounter: Payer: Self-pay | Admitting: Rehabilitative and Restorative Service Providers"

## 2024-08-25 ENCOUNTER — Ambulatory Visit: Admitting: Rehabilitative and Restorative Service Providers"

## 2024-08-25 DIAGNOSIS — M6281 Muscle weakness (generalized): Secondary | ICD-10-CM

## 2024-08-25 DIAGNOSIS — M5459 Other low back pain: Secondary | ICD-10-CM

## 2024-08-25 DIAGNOSIS — M25551 Pain in right hip: Secondary | ICD-10-CM

## 2024-08-25 DIAGNOSIS — R29898 Other symptoms and signs involving the musculoskeletal system: Secondary | ICD-10-CM

## 2024-08-25 DIAGNOSIS — R2689 Other abnormalities of gait and mobility: Secondary | ICD-10-CM

## 2024-08-25 NOTE — Therapy (Signed)
 OUTPATIENT PHYSICAL THERAPY THORACOLUMBAR TREATMENT   Patient Name: Teresa Spence MRN: [id number] DOB:[birth year], 73 y.o., female Today's Date: 08/25/2024  END OF SESSION:  PT End of Session - 08/25/24 0855     Visit Number 14    Number of Visits 16    Date for Recertification  09/21/24    Authorization Type [employer]na medicare copay $10 no auth required    Authorization - Visit Number 14    Progress Note Due on Visit 20    PT Start Time [phone]    PT Stop Time 0930    PT Time Calculation (min) 38 min    Activity Tolerance Patient tolerated treatment well          Past Medical History:  Diagnosis Date   Allergy 1975   Environmental   Arthritis 1985   Bilateral carpal tunnel syndrome 01/20/2020   Diabetes mellitus without complication (HCC)    Heart murmur 105w   Hypertension    Past Surgical History:  Procedure Laterality Date   BUNIONECTOMY Right 06/10/2019   HAMMER TOE SURGERY     JOINT REPLACEMENT  2015   MEDIAL PARTIAL KNEE REPLACEMENT     TONSILLECTOMY     Patient Active Problem List   Diagnosis Date Noted   Left leg numbness 06/23/2024   Acute right-sided low back pain without sciatica 06/02/2024   Vitamin D  deficiency 06/02/2024   Uncontrolled type 2 diabetes mellitus with hypoglycemia (HCC) 06/02/2024   Sesamoiditis of left foot 09/06/2023   Microalbuminuria 06/12/2023   Type 2 diabetes mellitus with diabetic polyneuropathy, without long-term current use of insulin (HCC) 08/28/2021   CKD (chronic kidney disease) stage 2, GFR 60-89 ml/min 08/28/2021   Hyperlipidemia LDL goal <70 08/23/2020   DDD (degenerative disc disease), cervical 12/01/2019   Fine motor skill loss 12/01/2019   Tinnitus, left 09/14/2019   Neuropathy 06/03/2019   Post-menopausal 06/03/2019   Generalized osteoarthritis of hand 09/11/2018   Hyperlipidemia associated with type 2 diabetes mellitus (HCC) 05/06/2018   Dermoid cyst of right ear 05/06/2018   Numbness and tingling in both hands  05/08/2017   Primary osteoarthritis involving multiple joints 05/08/2017   Essential hypertension 12/27/2016   Uncontrolled type II diabetes with stage 2 chronic kidney disease 12/27/2016    PCP: Vermell LITTIE Bologna; PA-C  REFERRING PROVIDER: Vermell LITTIE Bologna; PA-C  REFERRING DIAG: Acute R sided LBP   Rationale for Evaluation and Treatment: Rehabilitation  THERAPY DIAG:  Other low back pain  Pain of right hip joint  Muscle weakness (generalized)  Other abnormalities of gait and mobility  Other symptoms and signs involving the musculoskeletal system  Pain of both hip joints  ONSET DATE: 10/12/23  SUBJECTIVE:  SUBJECTIVE STATEMENT: Pt reports she has some soreness in R hip but has recovered from the black band.    EVAL: Patient has had increased pain in the R hip and knee since February, 2025 with no known injury. Patient reports that she went to [place] in the spring and she did a lot of walking including stairs. She walked every day for 9 days. She has pain in the R hip and R knee. She braced the knee and use lidocaine on the hip. Pain is still there in the R hip and knee in the morning and decreases in the day and by the end of the day. She has no pain at the end of the day. She has had leg length difference since childhood.     PERTINENT HISTORY:  Leg length difference since childhood R LE longer about an inch compared to L. Uncontrolled type 2 DM with polyneuropathy; HTN; hyperlipidemia; vitamin D  deficiency; R sided LBP without sciatica   PAIN:  Are you having pain? Yes: NPRS scale: 2/10 Pain location: R knee;  Pain description: sharp, stabby Aggravating factors: morning  Relieving factors: moving; stretching; heat; lidocaine roller; better as the day goes by   PRECAUTIONS: None   WEIGHT  BEARING RESTRICTIONS: No  FALLS:  Has patient fallen in last 6 months? No  LIVING ENVIRONMENT: Lives with: lives alone  Lives in: House/apartment Stairs: Yes: External: 1 steps; none; second floor but seldom goes up to second floor  Has following equipment at home: walk in shower  OCCUPATION: retired for corporate accounting retired ~ 5 yrs ago; takes classes and is involved with various groups; sedentary; sits in soft sofa when at home - active with art   PATIENT GOALS: decrease pain and walk with less pain   NEXT MD VISIT: 09/01/24  OBJECTIVE:  Note: Objective measures were completed at Evaluation unless otherwise noted.  DIAGNOSTIC FINDINGS:  No diagnostic test for lumbar spine  PATIENT SURVEYS:  Modified Oswestry: 20/50; 40%   Interpretation of scores: Score Category Description  0-20% Minimal Disability The patient can cope with most living activities. Usually no treatment is indicated apart from advice on lifting, sitting and exercise  21-40% Moderate Disability The patient experiences more pain and difficulty with sitting, lifting and standing. Travel and social life are more difficult and they may be disabled from work. Personal care, sexual activity and sleeping are not grossly affected, and the patient can usually be managed by conservative means  41-60% Severe Disability Pain remains the main problem in this group, but activities of daily living are affected. These patients require a detailed investigation  61-80% Crippled Back pain impinges on all aspects of the patients life. Positive intervention is required  81-100% Bed-bound  These patients are either bed-bound or exaggerating their symptoms  Bluford FORBES Zoe DELENA Karon DELENA, et al. Surgery versus conservative management of stable thoracolumbar fracture: the PRESTO feasibility RCT. [place] ([place]): [employer]; 2021 Nov. [facility] Technology Assessment, No. 25.62.) Appendix 3, Oswestry Disability Index  category descriptors. Available from: Findjewelers.cz  Minimally Clinically Important Difference (MCID) = 12.8%  08/10/24:  Modified Oswestry: 14/50; 28%    SENSATION: L leg will go numb when walking on an intermittent basis - not consistent; resolves with bending forward touching ground - happens about every 2 weeks   MUSCLE LENGTH: Hamstrings: Right 75 deg; Left 70 deg Thomas test: Right tight  > Left   POSTURE: rounded shoulders, forward head, flexed trunk , and weight shift left; R  LE in hip and knee flexion; R calf atrophied   PALPATION: Tight and tender R iliacus, psoas compared to L; muscular tightness R lower lumbar/SI area; R posterior hip through the piriformis and glut med/min/obturator   LUMBAR ROM:   AROM eval 08/10/24  Flexion 90% wide based stance    Extension 25%   Right lateral flexion 70%   Left lateral flexion 80%   Right rotation 60%   Left rotation 45%    (Blank rows = not tested)  LOWER EXTREMITY MMT:      MMT  Right eval 08/10/24 Left eval 08/10/24  Hip flexion 4 4+ 5 5  Hip extension 4- 4 4- 4  Hip abduction 4 4+ 4+ 4+  Hip adduction      Hip internal rotation      Hip external rotation      Knee flexion 5  5   Knee extension 5  5   Ankle dorsiflexion      Ankle plantarflexion      Ankle inversion      Ankle eversion       (Blank rows = not tested)  LOWER EXTREMITY ROM: limited end range hip extension; rotation R > L    Right eval Left eval  Hip flexion    Hip extension    Hip abduction    Hip adduction    Hip internal rotation    Hip external rotation    Knee flexion    Knee extension    Ankle dorsiflexion    Ankle plantarflexion    Ankle inversion    Ankle eversion     (Blank rows = not tested)  LUMBAR SPECIAL TESTS:  Straight leg raise test: Negative and Slump test: Negative  FUNCTIONAL TESTS:  5 times sit to stand: 10.53 sec  SLS R 10 sec some instability: L 5 sec instability  08/10/24:   5 times sit to stand: 8.81 sec no use of UE's  SLS: R 7  sec; L 5  sec   GAIT: Distance walked: 40 feet  Assistive device utilized: None Level of assistance: Complete Independence Comments: abnormal gait pattern; Trendelenburg gait   [facility] Adult PT Treatment:                                                DATE: 08/24/2024 Therapeutic Exercise: Prone quad stretch with strap (Rt/Lt) 30 x 2  LTR x10 --> figure 4 LTR (Rt crossed on Lt) x12 Supine hamstring stretch with strap 30 sec x 2  (Rt) figure 4 alternating piriformis/glute stretch Supine hip flexor stretch 30 sec using strap to bring LE back to neutral x 3 R/L  Neuromuscular re-ed: Side Lying: Bent knee hip abd black TB x 8 Supine:  Hip abduction alternating LE's Prone  Glut set 10 sec x 10  Hip extension alternating LE's 2 sec x 10 R/L  Therapeutic activity  Sit to stand hinged hip x 10  Hip abduction with hips rolled forward, leading with heel to recruit glut med to fatigue  Bridge feet further extended to avoid HS cramping 3 sec x 10  Manual:  Hip flexor release R > L  STM/TPR posterior hip through gluts and piriformis    [facility] Adult PT Treatment:  DATE: 08/19/24 Therapeutic Exercise: HEP review  Updated HEP Piriformis stretch in supine figure 4 alternating with glute stretch Rt side only 4x30 sec  LTR x20 with legs together moving through the motion together  Hamstring stretch in supine 2x 30 sec BIL with strap  Neuromuscular re-ed: Supine sciatic nerve mobilization with strap 20x BIL , one leg at a time-really liked Supine sciatic nerve mobilization pillows stacked at 4 in/10 cm in height BIL simultaneously  Pelvic tilts 2x20 -flatten back into table cue works well  90/90 abdominal bracing for 1-2 sec hold 2x5  Therapeutic Activity: Sit to stands x10 - emphasized to increase hip ROM during hip hinge motion of transfer for gaining more momentum to full  standing  [facility] Adult PT Treatment:                                                DATE: 08/17/2024 Therapeutic Exercise: Prone quad stretch with strap (Rt/Lt) 30 x 2  LTR x10 --> figure 4 LTR (Rt crossed on Lt) x12 Supine hamstring stretch with strap 30 sec x 2  (Rt) figure 4 alternating piriformis/glute stretch Neuromuscular re-ed: Side Lying: Bent knee hip abd black TB x 8 Supine:  Hip abduction alternating LE's black TB 3 sec hold Shoulder flexion holding 5# DB engaging core with black TB proximal to knees 10 x 2   Prone  Glut set 10 sec x 10  Hip extension alternating LE's 2 sec x 10 R/L  Therapeutic activity  Hip abduction with hips rolled forward, leading with heel to recruit glut med to fatigue  Bridge black TB distal thigh - feet further extended to avoid HS cramping 3 sec x 10  Alternating hip abduction hooklying black TB 3 sec x 10 R/L  Clam shell supine bilat black TB 3 sec x 20     [facility] Adult PT Treatment:                                                DATE: 08/10/2024 Therapeutic Exercise: Prone quad stretch with strap (Rt/Lt) 30 x 2  LTR x10 --> figure 4 LTR (Rt crossed on Lt) x12 Supine hamstring stretch with strap 30 sec x 2  (Rt) figure 4 alternating piriformis/glute stretch Neuromuscular re-ed: Side Lying: Bent knee hip abd x12 Clamshells green TB x 20 Supine:  Shoulder flexion holding 5# DB engaging core with green TB proximal to knees 10 x 2   Prone  Glut set 10 sec x 10  Hip extension alternating LE's 2 sec x 10 R/L  Therapeutic activity  Bridge red TB distal thigh - feet further extended to avoid HS cramping 3 sec x 10  Alternating hip abduction hooklying green TB 3 sec x 10 R/L  Clam shell supine bilat green TB 3 sec x 20  PATIENT EDUCATION:  Education details: See treatment  Person educated: Patient Education method:  Explanation, Demonstration, Tactile cues, and Verbal cueshandout  Education comprehension: verbalized understanding, returned demonstration, verbal cues required, tactile cues required, and needs further education  HOME EXERCISE PROGRAM: Access Code: [email or web address] URL: [email or web address] Date: 08/25/2024 Prepared by: Lasondra Hodgkins  Exercises - Supine Piriformis Stretch with Leg Straight  - 2 x daily - 7 x weekly - 1 sets - 3 reps - 30 sec  hold - Supine Lower Trunk Rotation  - 1 x daily - 7 x weekly - 3 sets - 10 reps - Sidelying Open Book Thoracic Lumbar Rotation and Extension  - 1 x daily - 7 x weekly - 3 sets - 10 reps - Clam with Resistance  - 1 x daily - 7 x weekly - 1 sets - 3 reps - 30 sec  hold - Supine Bridge with Resistance Band  - 2 x daily - 7 x weekly - 1-2 sets - 10 reps - 5-10 sec  hold - Hooklying Isometric Clamshell  - 2 x daily - 7 x weekly - 1 sets - 10 reps - 3 sec  hold - Hooklying Hamstring Stretch with Strap  - 1 x daily - 7 x weekly - 1 sets - 3 reps - 30 sec  hold - Supine Sciatic Nerve Mobilization with Strap  - 1 x daily - 7 x weekly - 3 sets - 10 reps - Supine Sciatic Nerve Mobilization With Leg on Pillow  - 1 x daily - 7 x weekly - 3 sets - 10 reps - Supine Posterior Pelvic Tilt  - 1 x daily - 7 x weekly - 3 sets - 10 reps - Supine 90/90 Abdominal Bracing  - 1 x daily - 7 x weekly - 3 sets - 10 reps - Hip Flexor Stretch at Edge of Bed  - 2 x daily - 7 x weekly - 1 sets - 3 reps - 30 sec  hold  Clinical impression:   Patient returns with less soreness from exercise last week. She has some tightness in the R > L hip flexors. Added hip flexor stretch in supine. Continued with stretching and strengthening exercises. Tolerated exercises with modifications   GOALS: Goals reviewed with patient? Yes  SHORT TERM GOALS: Target date: 07/15/2024  Independent in initial HEP  Baseline: Goal status: MET on 06/30/24  2.  Patient demonstrates proper transfers  sit to sidelying to supine and reverse to decrease irritation through the LB/R hip  Baseline:  Goal status: Met on 07/03/24  3.  Patient reports performing consistent HEP on a daily basis at least 5 days/week Baseline:  Goal status: MET 08/10/24   LONG TERM GOALS: Target date: 09/21/2024    Decrease pain in LB and R hip by 50-75% allowing patient to participate in normal functional and recreational activities  Baseline:  Goal status: on going   2.  Patient reports awakening with LB and R hip pain no greater than 2/10 in the morning Baseline:  Goal status: on going   3.  4+/5 to 5/5 strength bilat LE's  Baseline:  Goal status: partially met  4.  Patient reports ability to walk for 15-20 min with minimal to no increase in baseline back and hip pain  Baseline:  Goal status: on going   5.  Patient demonstrates improvement in modified oswestry score by 15%  Baseline: eval - Modified Oswestry: 20/50; 40%  08/10/24: 14/50; 28% Goal status: on going  6.  Independent in advance HEP including aquatic therapy as indicated  Baseline:  Goal status: on going   PLAN:  PT FREQUENCY: 2x/week  PT DURATION: 8 weeks  PLANNED INTERVENTIONS: 97164- PT Re-evaluation, 97110-Therapeutic exercises, 97530- Therapeutic activity, [id number]- Neuromuscular re-education, 97535- Self Care, [id number]- Manual therapy, [phone]- Aquatic Therapy, Patient/Family education, Balance training, Taping, and Joint mobilization.  PLAN FOR NEXT SESSION: lumbar mobility, glute strength, hip mobility Sciatic nerve mobilizations! Do more nerve glides-responded very well, stretching is still important to do for pain relief, progress core engagement,progress core engagement with LE movement ,  Review proper sit to stands, how did nerve glides go at home??  Saadiya Wilfong P. Ina PT, MPH 08/25/2024 8:55 AM

## 2024-08-26 ENCOUNTER — Other Ambulatory Visit: Payer: Self-pay | Admitting: Physician Assistant

## 2024-08-26 DIAGNOSIS — E785 Hyperlipidemia, unspecified: Secondary | ICD-10-CM

## 2024-08-26 DIAGNOSIS — E11649 Type 2 diabetes mellitus with hypoglycemia without coma: Secondary | ICD-10-CM

## 2024-08-27 ENCOUNTER — Ambulatory Visit

## 2024-08-27 DIAGNOSIS — M6281 Muscle weakness (generalized): Secondary | ICD-10-CM | POA: Diagnosis not present

## 2024-08-27 DIAGNOSIS — M25551 Pain in right hip: Secondary | ICD-10-CM

## 2024-08-27 DIAGNOSIS — M5459 Other low back pain: Secondary | ICD-10-CM

## 2024-08-27 NOTE — Therapy (Addendum)
 OUTPATIENT PHYSICAL THERAPY THORACOLUMBAR TREATMENT   Patient Name: Teresa Spence MRN: [id number] DOB:01-Apr-1951, 73 y.o., female Today's Date: 08/27/2024  END OF SESSION:  PT End of Session - 08/27/24 0803     Visit Number 15    Number of Visits 32    Date for Recertification  09/21/24    Authorization Type aetna medicare copay $10 no auth required    Progress Note Due on Visit 20    PT Start Time 0805    PT Stop Time 0843    PT Time Calculation (min) 38 min    Activity Tolerance Patient tolerated treatment well    Behavior During Therapy [facility] for tasks assessed/performed         Past Medical History:  Diagnosis Date   Allergy 1975   Environmental   Arthritis 1985   Bilateral carpal tunnel syndrome 01/20/2020   Diabetes mellitus without complication (HCC)    Heart murmur 105w   Hypertension    Past Surgical History:  Procedure Laterality Date   BUNIONECTOMY Right 06/10/2019   HAMMER TOE SURGERY     JOINT REPLACEMENT  2015   MEDIAL PARTIAL KNEE REPLACEMENT     TONSILLECTOMY     Patient Active Problem List   Diagnosis Date Noted   Left leg numbness 06/23/2024   Acute right-sided low back pain without sciatica 06/02/2024   Vitamin D  deficiency 06/02/2024   Uncontrolled type 2 diabetes mellitus with hypoglycemia (HCC) 06/02/2024   Sesamoiditis of left foot 09/06/2023   Microalbuminuria 06/12/2023   Type 2 diabetes mellitus with diabetic polyneuropathy, without long-term current use of insulin (HCC) 08/28/2021   CKD (chronic kidney disease) stage 2, GFR 60-89 ml/min 08/28/2021   Hyperlipidemia LDL goal <70 08/23/2020   DDD (degenerative disc disease), cervical 12/01/2019   Fine motor skill loss 12/01/2019   Tinnitus, left 09/14/2019   Neuropathy 06/03/2019   Post-menopausal 06/03/2019   Generalized osteoarthritis of hand 09/11/2018   Hyperlipidemia associated with type 2 diabetes mellitus (HCC) 05/06/2018   Dermoid cyst of right ear 05/06/2018   Numbness and  tingling in both hands 05/08/2017   Primary osteoarthritis involving multiple joints 05/08/2017   Essential hypertension 12/27/2016   Uncontrolled type II diabetes with stage 2 chronic kidney disease 12/27/2016    PCP: Vermell LITTIE Bologna; PA-C  REFERRING PROVIDER: Vermell LITTIE Bologna; PA-C  REFERRING DIAG: Acute R sided LBP   Rationale for Evaluation and Treatment: Rehabilitation  THERAPY DIAG:  Other low back pain  Pain of right hip joint  Muscle weakness (generalized)  ONSET DATE: 10/12/23  SUBJECTIVE:  SUBJECTIVE STATEMENT: Patient reports she is doing something in her sleep that is making her hip hurt; during the day her hip feels fine but in the mornings she is stiff and sore in her Rt hip.   EVAL: Patient has had increased pain in the R hip and knee since February, 2025 with no known injury. Patient reports that she went to [place] in the spring and she did a lot of walking including stairs. She walked every day for 9 days. She has pain in the R hip and R knee. She braced the knee and use lidocaine on the hip. Pain is still there in the R hip and knee in the morning and decreases in the day and by the end of the day. She has no pain at the end of the day. She has had leg length difference since childhood.     PERTINENT HISTORY:  Leg length difference since childhood R LE longer about an inch compared to L. Uncontrolled type 2 DM with polyneuropathy; HTN; hyperlipidemia; vitamin D  deficiency; R sided LBP without sciatica   PAIN:  Are you having pain? Yes: NPRS scale: 2/10 Pain location: R knee;  Pain description: sharp, stabby Aggravating factors: morning  Relieving factors: moving; stretching; heat; lidocaine roller; better as the day goes by   PRECAUTIONS: None   WEIGHT BEARING RESTRICTIONS:  No  FALLS:  Has patient fallen in last 6 months? No  LIVING ENVIRONMENT: Lives with: lives alone  Lives in: House/apartment Stairs: Yes: External: 1 steps; none; second floor but seldom goes up to second floor  Has following equipment at home: walk in shower  OCCUPATION: retired for corporate accounting retired ~ 5 yrs ago; takes classes and is involved with various groups; sedentary; sits in soft sofa when at home - active with art   PATIENT GOALS: decrease pain and walk with less pain   NEXT MD VISIT: 09/01/24  OBJECTIVE:  Note: Objective measures were completed at Evaluation unless otherwise noted.  DIAGNOSTIC FINDINGS:  No diagnostic test for lumbar spine  PATIENT SURVEYS:  Modified Oswestry: 20/50; 40%   Interpretation of scores: Score Category Description  0-20% Minimal Disability The patient can cope with most living activities. Usually no treatment is indicated apart from advice on lifting, sitting and exercise  21-40% Moderate Disability The patient experiences more pain and difficulty with sitting, lifting and standing. Travel and social life are more difficult and they may be disabled from work. Personal care, sexual activity and sleeping are not grossly affected, and the patient can usually be managed by conservative means  41-60% Severe Disability Pain remains the main problem in this group, but activities of daily living are affected. These patients require a detailed investigation  61-80% Crippled Back pain impinges on all aspects of the patients life. Positive intervention is required  81-100% Bed-bound  These patients are either bed-bound or exaggerating their symptoms  Bluford FORBES Zoe DELENA Karon DELENA, et al. Surgery versus conservative management of stable thoracolumbar fracture: the PRESTO feasibility RCT. [place] ([place]): [employer]; 2021 Nov. [facility] Technology Assessment, No. 25.62.) Appendix 3, Oswestry Disability Index category descriptors.  Available from: [email or web address]  Minimally Clinically Important Difference (MCID) = 12.8%  08/10/24:  Modified Oswestry: 14/50; 28%    SENSATION: L leg will go numb when walking on an intermittent basis - not consistent; resolves with bending forward touching ground - happens about every 2 weeks   MUSCLE LENGTH: Hamstrings: Right 75 deg; Left 70 deg Thomas test: Right tight  >  Left   POSTURE: rounded shoulders, forward head, flexed trunk , and weight shift left; R LE in hip and knee flexion; R calf atrophied   PALPATION: Tight and tender R iliacus, psoas compared to L; muscular tightness R lower lumbar/SI area; R posterior hip through the piriformis and glut med/min/obturator   LUMBAR ROM:   AROM eval 08/10/24  Flexion 90% wide based stance    Extension 25%   Right lateral flexion 70%   Left lateral flexion 80%   Right rotation 60%   Left rotation 45%    (Blank rows = not tested)  LOWER EXTREMITY MMT:      MMT  Right eval 08/10/24 Left eval 08/10/24  Hip flexion 4 4+ 5 5  Hip extension 4- 4 4- 4  Hip abduction 4 4+ 4+ 4+  Hip adduction      Hip internal rotation      Hip external rotation      Knee flexion 5  5   Knee extension 5  5   Ankle dorsiflexion      Ankle plantarflexion      Ankle inversion      Ankle eversion       (Blank rows = not tested)  LOWER EXTREMITY ROM: limited end range hip extension; rotation R > L    Right eval Left eval  Hip flexion    Hip extension    Hip abduction    Hip adduction    Hip internal rotation    Hip external rotation    Knee flexion    Knee extension    Ankle dorsiflexion    Ankle plantarflexion    Ankle inversion    Ankle eversion     (Blank rows = not tested)  LUMBAR SPECIAL TESTS:  Straight leg raise test: Negative and Slump test: Negative  FUNCTIONAL TESTS:  5 times sit to stand: 10.53 sec  SLS R 10 sec some instability: L 5 sec instability  08/10/24:  5 times sit to stand:  8.81 sec no use of UE's  SLS: R 7  sec; L 5  sec   GAIT: Distance walked: 40 feet  Assistive device utilized: None Level of assistance: Complete Independence Comments: abnormal gait pattern; Trendelenburg gait    [facility] Adult PT Treatment:                                                DATE: 08/27/2024 Therapeutic Exercise: Seated on corner of table --> Rt hip add stretch with Rt leg extended to side Supine sciatic nerve glide Figure 4 stretch with rotation Rt hip flexor stretch off edge of high table Neuromuscular re-ed: Seated:  Hip add ball squeeze 10 x 5 sec Hip abd green TB press 10 x 5 sec Resisted side stepping + red TB around shins (counter) Standing hip extension --> lifting from toe touch position Parallel 2 x 10 Diagonal 2 x 10 Hip hiking on 4 step    [facility] Adult PT Treatment:                                                DATE: 08/24/2024 Therapeutic Exercise: Prone quad stretch with strap (Rt/Lt) 30 x 2  LTR x10 -->  figure 4 LTR (Rt crossed on Lt) x12 Supine hamstring stretch with strap 30 sec x 2  (Rt) figure 4 alternating piriformis/glute stretch Supine hip flexor stretch 30 sec using strap to bring LE back to neutral x 3 R/L  Neuromuscular re-ed: Side Lying: Bent knee hip abd black TB x 8 Supine:  Hip abduction alternating LE's Prone  Glut set 10 sec x 10  Hip extension alternating LE's 2 sec x 10 R/L  Therapeutic activity  Sit to stand hinged hip x 10  Hip abduction with hips rolled forward, leading with heel to recruit glut med to fatigue  Bridge feet further extended to avoid HS cramping 3 sec x 10  Manual:  Hip flexor release R > L  STM/TPR posterior hip through gluts and piriformis     [facility] Adult PT Treatment:                                                DATE: 08/19/24 Therapeutic Exercise: HEP review  Updated HEP Piriformis stretch in supine figure 4 alternating with glute stretch Rt side only 4x30 sec  LTR x20 with legs together moving  through the motion together  Hamstring stretch in supine 2x 30 sec BIL with strap  Neuromuscular re-ed: Supine sciatic nerve mobilization with strap 20x BIL , one leg at a time-really liked Supine sciatic nerve mobilization pillows stacked at 4 in/10 cm in height BIL simultaneously  Pelvic tilts 2x20 -flatten back into table cue works well  90/90 abdominal bracing for 1-2 sec hold 2x5  Therapeutic Activity: Sit to stands x10 - emphasized to increase hip ROM during hip hinge motion of transfer for gaining more momentum to full standing   PATIENT EDUCATION:  Education details: See treatment  Person educated: Patient Education method: Explanation, Demonstration, Tactile cues, and Verbal cueshandout  Education comprehension: verbalized understanding, returned demonstration, verbal cues required, tactile cues required, and needs further education  HOME EXERCISE PROGRAM: Access Code: [email or web address] URL: [email or web address] Date: 08/25/2024 Prepared by: Celyn Holt  Exercises - Supine Piriformis Stretch with Leg Straight  - 2 x daily - 7 x weekly - 1 sets - 3 reps - 30 sec  hold - Supine Lower Trunk Rotation  - 1 x daily - 7 x weekly - 3 sets - 10 reps - Sidelying Open Book Thoracic Lumbar Rotation and Extension  - 1 x daily - 7 x weekly - 3 sets - 10 reps - Clam with Resistance  - 1 x daily - 7 x weekly - 1 sets - 3 reps - 30 sec  hold - Supine Bridge with Resistance Band  - 2 x daily - 7 x weekly - 1-2 sets - 10 reps - 5-10 sec  hold - Hooklying Isometric Clamshell  - 2 x daily - 7 x weekly - 1 sets - 10 reps - 3 sec  hold - Hooklying Hamstring Stretch with Strap  - 1 x daily - 7 x weekly - 1 sets - 3 reps - 30 sec  hold - Supine Sciatic Nerve Mobilization with Strap  - 1 x daily - 7 x weekly - 3 sets - 10 reps - Supine Sciatic Nerve Mobilization With Leg on Pillow  - 1 x daily - 7 x weekly - 3 sets - 10 reps - Supine Posterior Pelvic Tilt  - 1 x daily - 7  x weekly - 3 sets -  10 reps - Supine 90/90 Abdominal Bracing  - 1 x daily - 7 x weekly - 3 sets - 10 reps - Hip Flexor Stretch at Edge of Bed  - 2 x daily - 7 x weekly - 1 sets - 3 reps - 30 sec  hold  Clinical impression:  Continued hip strengthening with seated and standing exercises to tolerance. Moderate fatigue noted with resisted hip abduction however no pain exacerbated in Rt hip. Continues to reports increased tingling down bilateral legs by end of session after completing exercises.    GOALS: Goals reviewed with patient? Yes  SHORT TERM GOALS: Target date: 07/15/2024  Independent in initial HEP  Baseline: Goal status: MET on 06/30/24  2.  Patient demonstrates proper transfers sit to sidelying to supine and reverse to decrease irritation through the LB/R hip  Baseline:  Goal status: Met on 07/03/24  3.  Patient reports performing consistent HEP on a daily basis at least 5 days/week Baseline:  Goal status: MET 08/10/24   LONG TERM GOALS: Target date: 09/21/2024  Decrease pain in LB and R hip by 50-75% allowing patient to participate in normal functional and recreational activities  Baseline:  Goal status: on going   2.  Patient reports awakening with LB and R hip pain no greater than 2/10 in the morning Baseline:  Goal status: on going   3.  4+/5 to 5/5 strength bilat LE's  Baseline:  Goal status: partially met  4.  Patient reports ability to walk for 15-20 min with minimal to no increase in baseline back and hip pain  Baseline:  Goal status: on going   5.  Patient demonstrates improvement in modified oswestry score by 15%  Baseline: eval - Modified Oswestry: 20/50; 40%  08/10/24: 14/50; 28% Goal status: on going   6.  Independent in advance HEP including aquatic therapy as indicated  Baseline:  Goal status: on going   PLAN:  PT FREQUENCY: 2x/week  PT DURATION: 8 weeks  PLANNED INTERVENTIONS: 97164- PT Re-evaluation, 97110-Therapeutic exercises, 97530- Therapeutic  activity, [id number]- Neuromuscular re-education, 97535- Self Care, [id number]- Manual therapy, [phone]- Aquatic Therapy, Patient/Family education, Balance training, Taping, and Joint mobilization.  PLAN FOR NEXT SESSION: lumbar mobility, glute strength, hip mobility Sciatic nerve mobilizations! Do more nerve glides-responded very well, stretching is still important to do for pain relief, progress core engagement, progress core engagement with LE movement,  Review proper sit to stands, how did nerve glides go at home??   Lamarr Price, PTA 08/27/2024 9:07 AM

## 2024-09-01 ENCOUNTER — Ambulatory Visit: Admitting: Physician Assistant

## 2024-09-07 ENCOUNTER — Ambulatory Visit

## 2024-09-07 DIAGNOSIS — R2689 Other abnormalities of gait and mobility: Secondary | ICD-10-CM

## 2024-09-07 DIAGNOSIS — M6281 Muscle weakness (generalized): Secondary | ICD-10-CM

## 2024-09-07 DIAGNOSIS — R29898 Other symptoms and signs involving the musculoskeletal system: Secondary | ICD-10-CM

## 2024-09-07 DIAGNOSIS — M5459 Other low back pain: Secondary | ICD-10-CM

## 2024-09-07 DIAGNOSIS — M25551 Pain in right hip: Secondary | ICD-10-CM

## 2024-09-07 NOTE — Therapy (Signed)
 " OUTPATIENT PHYSICAL THERAPY THORACOLUMBAR TREATMENT   Patient Name: Teresa Spence MRN: [id number] DOB:04-09-1951, 73 y.o., female Today's Date: 09/07/2024  END OF SESSION:  PT End of Session - 09/07/24 0805     Visit Number 16    Number of Visits 32    Date for Recertification  09/21/24    Authorization Type aetna medicare copay $10 no auth required    Authorization - Visit Number 16    Progress Note Due on Visit 20    PT Start Time 0805    PT Stop Time 0845    PT Time Calculation (min) 40 min    Activity Tolerance Patient tolerated treatment well    Behavior During Therapy [facility] for tasks assessed/performed         Past Medical History:  Diagnosis Date   Allergy 1975   Environmental   Arthritis 1985   Bilateral carpal tunnel syndrome 01/20/2020   Diabetes mellitus without complication (HCC)    Heart murmur 105w   Hypertension    Past Surgical History:  Procedure Laterality Date   BUNIONECTOMY Right 06/10/2019   HAMMER TOE SURGERY     JOINT REPLACEMENT  2015   MEDIAL PARTIAL KNEE REPLACEMENT     TONSILLECTOMY     Patient Active Problem List   Diagnosis Date Noted   Left leg numbness 06/23/2024   Acute right-sided low back pain without sciatica 06/02/2024   Vitamin D  deficiency 06/02/2024   Uncontrolled type 2 diabetes mellitus with hypoglycemia (HCC) 06/02/2024   Sesamoiditis of left foot 09/06/2023   Microalbuminuria 06/12/2023   Type 2 diabetes mellitus with diabetic polyneuropathy, without long-term current use of insulin (HCC) 08/28/2021   CKD (chronic kidney disease) stage 2, GFR 60-89 ml/min 08/28/2021   Hyperlipidemia LDL goal <70 08/23/2020   DDD (degenerative disc disease), cervical 12/01/2019   Fine motor skill loss 12/01/2019   Tinnitus, left 09/14/2019   Neuropathy 06/03/2019   Post-menopausal 06/03/2019   Generalized osteoarthritis of hand 09/11/2018   Hyperlipidemia associated with type 2 diabetes mellitus (HCC) 05/06/2018   Dermoid cyst of  right ear 05/06/2018   Numbness and tingling in both hands 05/08/2017   Primary osteoarthritis involving multiple joints 05/08/2017   Essential hypertension 12/27/2016   Uncontrolled type II diabetes with stage 2 chronic kidney disease 12/27/2016    PCP: Vermell LITTIE Bologna; PA-C  REFERRING PROVIDER: Vermell LITTIE Bologna; PA-C  REFERRING DIAG: Acute R sided LBP   Rationale for Evaluation and Treatment: Rehabilitation  THERAPY DIAG:  Other low back pain  Pain of right hip joint  Muscle weakness (generalized)  Other abnormalities of gait and mobility  Other symptoms and signs involving the musculoskeletal system  Pain of both hip joints  ONSET DATE: 10/12/23  SUBJECTIVE:  SUBJECTIVE STATEMENT: Patient reports she is doing something in her sleep that is making her hip hurt; during the day her hip feels fine but in the mornings she is stiff and sore in her Rt hip.   EVAL: Patient has had increased pain in the R hip and knee since February, 2025 with no known injury. Patient reports that she went to [place] in the spring and she did a lot of walking including stairs. She walked every day for 9 days. She has pain in the R hip and R knee. She braced the knee and use lidocaine on the hip. Pain is still there in the R hip and knee in the morning and decreases in the day and by the end of the day. She has no pain at the end of the day. She has had leg length difference since childhood.     PERTINENT HISTORY:  Leg length difference since childhood R LE longer about an inch compared to L. Uncontrolled type 2 DM with polyneuropathy; HTN; hyperlipidemia; vitamin D  deficiency; R sided LBP without sciatica   PAIN:  Are you having pain? Yes: NPRS scale: 2/10 Pain location: R knee;  Pain description: sharp,  stabby Aggravating factors: morning  Relieving factors: moving; stretching; heat; lidocaine roller; better as the day goes by   PRECAUTIONS: None   WEIGHT BEARING RESTRICTIONS: No  FALLS:  Has patient fallen in last 6 months? No  LIVING ENVIRONMENT: Lives with: lives alone  Lives in: House/apartment Stairs: Yes: External: 1 steps; none; second floor but seldom goes up to second floor  Has following equipment at home: walk in shower  OCCUPATION: retired for corporate accounting retired ~ 5 yrs ago; takes classes and is involved with various groups; sedentary; sits in soft sofa when at home - active with art   PATIENT GOALS: decrease pain and walk with less pain   NEXT MD VISIT: 09/01/24  OBJECTIVE:  Note: Objective measures were completed at Evaluation unless otherwise noted.  DIAGNOSTIC FINDINGS:  No diagnostic test for lumbar spine  PATIENT SURVEYS:  Modified Oswestry: 20/50; 40%   Interpretation of scores: Score Category Description  0-20% Minimal Disability The patient can cope with most living activities. Usually no treatment is indicated apart from advice on lifting, sitting and exercise  21-40% Moderate Disability The patient experiences more pain and difficulty with sitting, lifting and standing. Travel and social life are more difficult and they may be disabled from work. Personal care, sexual activity and sleeping are not grossly affected, and the patient can usually be managed by conservative means  41-60% Severe Disability Pain remains the main problem in this group, but activities of daily living are affected. These patients require a detailed investigation  61-80% Crippled Back pain impinges on all aspects of the patients life. Positive intervention is required  81-100% Bed-bound  These patients are either bed-bound or exaggerating their symptoms  Bluford FORBES Zoe DELENA Karon DELENA, et al. Surgery versus conservative management of stable thoracolumbar fracture: the  PRESTO feasibility RCT. [place] ([place]): [employer]; 2021 Nov. [facility] Technology Assessment, No. 25.62.) Appendix 3, Oswestry Disability Index category descriptors. Available from: Findjewelers.cz  Minimally Clinically Important Difference (MCID) = 12.8%  08/10/24:  Modified Oswestry: 14/50; 28%    SENSATION: L leg will go numb when walking on an intermittent basis - not consistent; resolves with bending forward touching ground - happens about every 2 weeks   MUSCLE LENGTH: Hamstrings: Right 75 deg; Left 70 deg Thomas test: Right tight  >  Left   POSTURE: rounded shoulders, forward head, flexed trunk , and weight shift left; R LE in hip and knee flexion; R calf atrophied   PALPATION: Tight and tender R iliacus, psoas compared to L; muscular tightness R lower lumbar/SI area; R posterior hip through the piriformis and glut med/min/obturator   LUMBAR ROM:   AROM eval 08/10/24  Flexion 90% wide based stance    Extension 25%   Right lateral flexion 70%   Left lateral flexion 80%   Right rotation 60%   Left rotation 45%    (Blank rows = not tested)  LOWER EXTREMITY MMT:      MMT  Right eval 08/10/24 Left eval 08/10/24  Hip flexion 4 4+ 5 5  Hip extension 4- 4 4- 4  Hip abduction 4 4+ 4+ 4+  Hip adduction      Hip internal rotation      Hip external rotation      Knee flexion 5  5   Knee extension 5  5   Ankle dorsiflexion      Ankle plantarflexion      Ankle inversion      Ankle eversion       (Blank rows = not tested)  LOWER EXTREMITY ROM: limited end range hip extension; rotation R > L    Right eval Left eval  Hip flexion    Hip extension    Hip abduction    Hip adduction    Hip internal rotation    Hip external rotation    Knee flexion    Knee extension    Ankle dorsiflexion    Ankle plantarflexion    Ankle inversion    Ankle eversion     (Blank rows = not tested)  LUMBAR SPECIAL TESTS:  Straight leg raise test:  Negative and Slump test: Negative  FUNCTIONAL TESTS:  5 times sit to stand: 10.53 sec  SLS R 10 sec some instability: L 5 sec instability  08/10/24:  5 times sit to stand: 8.81 sec no use of UE's  SLS: R 7  sec; L 5  sec   GAIT: Distance walked: 40 feet  Assistive device utilized: None Level of assistance: Complete Independence Comments: abnormal gait pattern; Trendelenburg gait    [facility] Adult PT Treatment:                                                DATE: 09/07/2024 Therapeutic Exercise: Seated trunk flexion stretch with red PB --> front & diagonals x 10 each way Supine wide leg hip IR/ER (wiper legs) x 2 min Neuromuscular re-ed: Hooklying bilateral clamshells + blue TB 10 x 5 sec Attempted bridge variations --> discontinued d/t HS cramping Prone: HS curls + 2#AW 2x10 Glute set x 8 Straight leg hip extension with glute set 2 x 10 --> (cramping with bent knee hip ext) Bridge + green TB --> blue TB around thighs x10 each Therapeutic Activity: Sciatic nerve glide x 10 Prone prop on elbows   [facility] Adult PT Treatment:                                                DATE: 08/27/2024 Therapeutic Exercise: Seated on corner of table -->  Rt hip add stretch with Rt leg extended to side Supine sciatic nerve glide Figure 4 stretch with rotation Rt hip flexor stretch off edge of high table Neuromuscular re-ed: Seated:  Hip add ball squeeze 10 x 5 sec Hip abd green TB press 10 x 5 sec Resisted side stepping + red TB around shins (counter) Standing hip extension --> lifting from toe touch position Parallel 2 x 10 Diagonal 2 x 10 Hip hiking on 4 step    [facility] Adult PT Treatment:                                                DATE: 08/24/2024 Therapeutic Exercise: Prone quad stretch with strap (Rt/Lt) 30 x 2  LTR x10 --> figure 4 LTR (Rt crossed on Lt) x12 Supine hamstring stretch with strap 30 sec x 2  (Rt) figure 4 alternating piriformis/glute stretch Supine hip flexor  stretch 30 sec using strap to bring LE back to neutral x 3 R/L  Neuromuscular re-ed: Side Lying: Bent knee hip abd black TB x 8 Supine:  Hip abduction alternating LE's Prone  Glut set 10 sec x 10  Hip extension alternating LE's 2 sec x 10 R/L  Therapeutic activity  Sit to stand hinged hip x 10  Hip abduction with hips rolled forward, leading with heel to recruit glut med to fatigue  Bridge feet further extended to avoid HS cramping 3 sec x 10  Manual:  Hip flexor release R > L  STM/TPR posterior hip through gluts and piriformis     PATIENT EDUCATION:  Education details: Updated HEP Person educated: Patient Education method: Explanation, Demonstration, Actor cues, and Verbal cueshandout  Education comprehension: verbalized understanding, returned demonstration, verbal cues required, tactile cues required, and needs further education  HOME EXERCISE PROGRAM: Access Code: [email or web address] URL: [email or web address] Date: 09/07/2024 Prepared by: Lamarr Price  Exercises - Supine Piriformis Stretch with Leg Straight  - 2 x daily - 7 x weekly - 1 sets - 3 reps - 30 sec  hold - Supine Lower Trunk Rotation  - 1 x daily - 7 x weekly - 3 sets - 10 reps - Sidelying Open Book Thoracic Lumbar Rotation and Extension  - 1 x daily - 7 x weekly - 3 sets - 10 reps - Clam with Resistance  - 1 x daily - 7 x weekly - 1 sets - 3 reps - 30 sec  hold - Supine Bridge with Resistance Band  - 2 x daily - 7 x weekly - 1-2 sets - 10 reps - 5-10 sec  hold - Hooklying Isometric Clamshell  - 2 x daily - 7 x weekly - 1 sets - 10 reps - 3 sec  hold - Hooklying Hamstring Stretch with Strap  - 1 x daily - 7 x weekly - 1 sets - 3 reps - 30 sec  hold - Supine Sciatic Nerve Mobilization with Strap  - 1 x daily - 7 x weekly - 3 sets - 10 reps - Supine Sciatic Nerve Mobilization With Leg on Pillow  - 1 x daily - 7 x weekly - 3 sets - 10 reps - Supine Posterior Pelvic Tilt  - 1 x daily - 7 x weekly - 3 sets -  10 reps - Hip Flexor Stretch at Edge of Bed  - 2 x daily - 7 x weekly -  1 sets - 3 reps - 30 sec  hold - Supine Sciatic Nerve Glide  - 2 x daily - 7 x weekly - 1 sets - 10 reps - Prone on Elbows Stretch  - 2 x daily - 7 x weekly - 3 reps - 1-2 min hold - Prone Hip Extension with Pillow Under Abdomen  - 1 x daily - 7 x weekly - 3 sets - 10 reps  Clinical impression:  Initial cramping with bridge variations; cueing decreased ROM and adding resistance band for hip abd stabilization alleviated symptoms. Continued glute strengthening to tolerance in supine and prone positions. Provided additional sciatic nerve glide variation due to good response.    GOALS: Goals reviewed with patient? Yes  SHORT TERM GOALS: Target date: 07/15/2024  Independent in initial HEP  Baseline: Goal status: MET on 06/30/24  2.  Patient demonstrates proper transfers sit to sidelying to supine and reverse to decrease irritation through the LB/R hip  Baseline:  Goal status: Met on 07/03/24  3.  Patient reports performing consistent HEP on a daily basis at least 5 days/week Baseline:  Goal status: MET 08/10/24   LONG TERM GOALS: Target date: 09/21/2024  Decrease pain in LB and R hip by 50-75% allowing patient to participate in normal functional and recreational activities  Baseline:  Goal status: on going   2.  Patient reports awakening with LB and R hip pain no greater than 2/10 in the morning Baseline:  Goal status: on going   3.  4+/5 to 5/5 strength bilat LE's  Baseline:  Goal status: partially met  4.  Patient reports ability to walk for 15-20 min with minimal to no increase in baseline back and hip pain  Baseline:  Goal status: on going   5.  Patient demonstrates improvement in modified oswestry score by 15%  Baseline: eval - Modified Oswestry: 20/50; 40%  08/10/24: 14/50; 28% Goal status: on going   6.  Independent in advance HEP including aquatic therapy as indicated  Baseline:  Goal status:  on going   PLAN:  PT FREQUENCY: 2x/week  PT DURATION: 8 weeks  PLANNED INTERVENTIONS: 97164- PT Re-evaluation, 97110-Therapeutic exercises, 97530- Therapeutic activity, [id number]- Neuromuscular re-education, 97535- Self Care, [id number]- Manual therapy, [phone]- Aquatic Therapy, Patient/Family education, Balance training, Taping, and Joint mobilization.  PLAN FOR NEXT SESSION: lumbar mobility, glute strength, hip mobility. Sciatic nerve mobilizations! Do more nerve glides-responded very well, stretching is still important to do for pain relief, progress core engagement, progress core engagement with LE movement  Lamarr Price, PTA 09/07/2024 8:47 AM          "

## 2024-09-09 ENCOUNTER — Telehealth: Payer: Self-pay

## 2024-09-09 ENCOUNTER — Encounter: Payer: Self-pay | Admitting: Physical Therapy

## 2024-09-09 ENCOUNTER — Ambulatory Visit: Admitting: Physical Therapy

## 2024-09-09 DIAGNOSIS — M25551 Pain in right hip: Secondary | ICD-10-CM

## 2024-09-09 DIAGNOSIS — M5459 Other low back pain: Secondary | ICD-10-CM

## 2024-09-09 DIAGNOSIS — M6281 Muscle weakness (generalized): Secondary | ICD-10-CM | POA: Diagnosis not present

## 2024-09-09 NOTE — Telephone Encounter (Signed)
 Patient dropped off Patient Assistance paperwork, forms faxed successfully, thanks.

## 2024-09-09 NOTE — Therapy (Addendum)
 " OUTPATIENT PHYSICAL THERAPY THORACOLUMBAR TREATMENT AND DISCHARGE   Patient Name: Teresa Spence MRN: [id number] DOB:[birth year], 73 y.o., female Today's Date: 09/09/2024  END OF SESSION:  PT End of Session - 09/09/24 0928     Visit Number 17    Number of Visits 32    Date for Recertification  09/21/24    Authorization - Visit Number 17    Progress Note Due on Visit 20    PT Start Time 0845    PT Stop Time 0927    PT Time Calculation (min) 42 min    Activity Tolerance Patient tolerated treatment well    Behavior During Therapy [facility] for tasks assessed/performed          Past Medical History:  Diagnosis Date   Allergy 1975   Environmental   Arthritis 1985   Bilateral carpal tunnel syndrome 01/20/2020   Diabetes mellitus without complication (HCC)    Heart murmur 105w   Hypertension    Past Surgical History:  Procedure Laterality Date   BUNIONECTOMY Right 06/10/2019   HAMMER TOE SURGERY     JOINT REPLACEMENT  2015   MEDIAL PARTIAL KNEE REPLACEMENT     TONSILLECTOMY     Patient Active Problem List   Diagnosis Date Noted   Left leg numbness 06/23/2024   Acute right-sided low back pain without sciatica 06/02/2024   Vitamin D  deficiency 06/02/2024   Uncontrolled type 2 diabetes mellitus with hypoglycemia (HCC) 06/02/2024   Sesamoiditis of left foot 09/06/2023   Microalbuminuria 06/12/2023   Type 2 diabetes mellitus with diabetic polyneuropathy, without long-term current use of insulin (HCC) 08/28/2021   CKD (chronic kidney disease) stage 2, GFR 60-89 ml/min 08/28/2021   Hyperlipidemia LDL goal <70 08/23/2020   DDD (degenerative disc disease), cervical 12/01/2019   Fine motor skill loss 12/01/2019   Tinnitus, left 09/14/2019   Neuropathy 06/03/2019   Post-menopausal 06/03/2019   Generalized osteoarthritis of hand 09/11/2018   Hyperlipidemia associated with type 2 diabetes mellitus (HCC) 05/06/2018   Dermoid cyst of right ear 05/06/2018   Numbness and tingling in  both hands 05/08/2017   Primary osteoarthritis involving multiple joints 05/08/2017   Essential hypertension 12/27/2016   Uncontrolled type II diabetes with stage 2 chronic kidney disease 12/27/2016    PCP: Vermell LITTIE Bologna; PA-C  REFERRING PROVIDER: Vermell LITTIE Bologna; PA-C  REFERRING DIAG: Acute R sided LBP   Rationale for Evaluation and Treatment: Rehabilitation  THERAPY DIAG:  Other low back pain  Pain of right hip joint  Muscle weakness (generalized)  ONSET DATE: 10/12/23  SUBJECTIVE:  SUBJECTIVE STATEMENT: Pt states she feels good. She states her pain is only 2/10. Exercises from last session really helped  EVAL: Patient has had increased pain in the R hip and knee since February, 2025 with no known injury. Patient reports that she went to [place] in the spring and she did a lot of walking including stairs. She walked every day for 9 days. She has pain in the R hip and R knee. She braced the knee and use lidocaine on the hip. Pain is still there in the R hip and knee in the morning and decreases in the day and by the end of the day. She has no pain at the end of the day. She has had leg length difference since childhood.     PERTINENT HISTORY:  Leg length difference since childhood R LE longer about an inch compared to L. Uncontrolled type 2 DM with polyneuropathy; HTN; hyperlipidemia; vitamin D  deficiency; R sided LBP without sciatica   PAIN:  Are you having pain? Yes: NPRS scale: 2/10 Pain location: R knee;  Pain description: sharp, stabby Aggravating factors: morning  Relieving factors: moving; stretching; heat; lidocaine roller; better as the day goes by   PRECAUTIONS: None   WEIGHT BEARING RESTRICTIONS: No  FALLS:  Has patient fallen in last 6 months? No  LIVING  ENVIRONMENT: Lives with: lives alone  Lives in: House/apartment Stairs: Yes: External: 1 steps; none; second floor but seldom goes up to second floor  Has following equipment at home: walk in shower  OCCUPATION: retired for corporate accounting retired ~ 5 yrs ago; takes classes and is involved with various groups; sedentary; sits in soft sofa when at home - active with art   PATIENT GOALS: decrease pain and walk with less pain   NEXT MD VISIT: 09/01/24  OBJECTIVE:  Note: Objective measures were completed at Evaluation unless otherwise noted.  DIAGNOSTIC FINDINGS:  No diagnostic test for lumbar spine  PATIENT SURVEYS:  Modified Oswestry: 20/50; 40%   Interpretation of scores: Score Category Description  0-20% Minimal Disability The patient can cope with most living activities. Usually no treatment is indicated apart from advice on lifting, sitting and exercise  21-40% Moderate Disability The patient experiences more pain and difficulty with sitting, lifting and standing. Travel and social life are more difficult and they may be disabled from work. Personal care, sexual activity and sleeping are not grossly affected, and the patient can usually be managed by conservative means  41-60% Severe Disability Pain remains the main problem in this group, but activities of daily living are affected. These patients require a detailed investigation  61-80% Crippled Back pain impinges on all aspects of the patients life. Positive intervention is required  81-100% Bed-bound  These patients are either bed-bound or exaggerating their symptoms  Bluford FORBES Zoe DELENA Karon DELENA, et al. Surgery versus conservative management of stable thoracolumbar fracture: the PRESTO feasibility RCT. [place] ([place]): [employer]; 2021 Nov. [facility] Technology Assessment, No. 25.62.) Appendix 3, Oswestry Disability Index category descriptors. Available from:  Findjewelers.cz  Minimally Clinically Important Difference (MCID) = 12.8%  08/10/24:  Modified Oswestry: 14/50; 28%    SENSATION: L leg will go numb when walking on an intermittent basis - not consistent; resolves with bending forward touching ground - happens about every 2 weeks   MUSCLE LENGTH: Hamstrings: Right 75 deg; Left 70 deg Thomas test: Right tight  > Left   POSTURE: rounded shoulders, forward head, flexed trunk , and weight shift left; R  LE in hip and knee flexion; R calf atrophied   PALPATION: Tight and tender R iliacus, psoas compared to L; muscular tightness R lower lumbar/SI area; R posterior hip through the piriformis and glut med/min/obturator   LUMBAR ROM:   AROM eval 08/10/24  Flexion 90% wide based stance    Extension 25%   Right lateral flexion 70%   Left lateral flexion 80%   Right rotation 60%   Left rotation 45%    (Blank rows = not tested)  LOWER EXTREMITY MMT:      MMT  Right eval 08/10/24 Left eval 08/10/24  Hip flexion 4 4+ 5 5  Hip extension 4- 4 4- 4  Hip abduction 4 4+ 4+ 4+  Hip adduction      Hip internal rotation      Hip external rotation      Knee flexion 5  5   Knee extension 5  5   Ankle dorsiflexion      Ankle plantarflexion      Ankle inversion      Ankle eversion       (Blank rows = not tested)  LOWER EXTREMITY ROM: limited end range hip extension; rotation R > L    Right eval Left eval  Hip flexion    Hip extension    Hip abduction    Hip adduction    Hip internal rotation    Hip external rotation    Knee flexion    Knee extension    Ankle dorsiflexion    Ankle plantarflexion    Ankle inversion    Ankle eversion     (Blank rows = not tested)  LUMBAR SPECIAL TESTS:  Straight leg raise test: Negative and Slump test: Negative  FUNCTIONAL TESTS:  5 times sit to stand: 10.53 sec  SLS R 10 sec some instability: L 5 sec instability  08/10/24:  5 times sit to stand: 8.81 sec no use  of UE's  SLS: R 7  sec; L 5  sec   GAIT: Distance walked: 40 feet  Assistive device utilized: None Level of assistance: Complete Independence Comments: abnormal gait pattern; Trendelenburg gait    [facility] Adult PT Treatment:                                                DATE: 09/09/24 Therapeutic Exercise: Supine wide leg ER/IR 2 x 10  Neuromuscular re-ed: Sciatic nerve glide Hooklying bilateral clamshells + blue TB 10 x 5 sec Bridge blue TB around thighs 2 x 10 Prone: HS curls + 2# AW 2 x 10 Straight leg hip extension 2 x 10 Standing Isometric hip ext bent knee into wall 5 x 3 sec hold bilat Bent over bent knee hip ext x 10 bilat Therapeutic Activity: Sit<> stand + glute squeeze 10#KB 2 x 10  Self Care: Discussion of HEP, activity modification for home  [facility] Adult PT Treatment:                                                DATE: 09/07/2024 Therapeutic Exercise: Seated trunk flexion stretch with red PB --> front & diagonals x 10 each way Supine wide leg hip IR/ER (wiper legs) x 2 min Neuromuscular  re-ed: Hooklying bilateral clamshells + blue TB 10 x 5 sec Attempted bridge variations --> discontinued d/t HS cramping Prone: HS curls + 2#AW 2x10 Glute set x 8 Straight leg hip extension with glute set 2 x 10 --> (cramping with bent knee hip ext) Bridge + green TB --> blue TB around thighs x10 each Therapeutic Activity: Sciatic nerve glide x 10 Prone prop on elbows   [facility] Adult PT Treatment:                                                DATE: 08/27/2024 Therapeutic Exercise: Seated on corner of table --> Rt hip add stretch with Rt leg extended to side Supine sciatic nerve glide Figure 4 stretch with rotation Rt hip flexor stretch off edge of high table Neuromuscular re-ed: Seated:  Hip add ball squeeze 10 x 5 sec Hip abd green TB press 10 x 5 sec Resisted side stepping + red TB around shins (counter) Standing hip extension --> lifting from toe touch  position Parallel 2 x 10 Diagonal 2 x 10 Hip hiking on 4 step    [facility] Adult PT Treatment:                                                DATE: 08/24/2024 Therapeutic Exercise: Prone quad stretch with strap (Rt/Lt) 30 x 2  LTR x10 --> figure 4 LTR (Rt crossed on Lt) x12 Supine hamstring stretch with strap 30 sec x 2  (Rt) figure 4 alternating piriformis/glute stretch Supine hip flexor stretch 30 sec using strap to bring LE back to neutral x 3 R/L  Neuromuscular re-ed: Side Lying: Bent knee hip abd black TB x 8 Supine:  Hip abduction alternating LE's Prone  Glut set 10 sec x 10  Hip extension alternating LE's 2 sec x 10 R/L  Therapeutic activity  Sit to stand hinged hip x 10  Hip abduction with hips rolled forward, leading with heel to recruit glut med to fatigue  Bridge feet further extended to avoid HS cramping 3 sec x 10  Manual:  Hip flexor release R > L  STM/TPR posterior hip through gluts and piriformis     PATIENT EDUCATION:  Education details: Updated HEP Person educated: Patient Education method: Explanation, Demonstration, Actor cues, and Verbal cueshandout  Education comprehension: verbalized understanding, returned demonstration, verbal cues required, tactile cues required, and needs further education  HOME EXERCISE PROGRAM: Access Code: [email or web address] URL: [email or web address] Date: 09/07/2024 Prepared by: Lamarr Price  Exercises - Supine Piriformis Stretch with Leg Straight  - 2 x daily - 7 x weekly - 1 sets - 3 reps - 30 sec  hold - Supine Lower Trunk Rotation  - 1 x daily - 7 x weekly - 3 sets - 10 reps - Sidelying Open Book Thoracic Lumbar Rotation and Extension  - 1 x daily - 7 x weekly - 3 sets - 10 reps - Clam with Resistance  - 1 x daily - 7 x weekly - 1 sets - 3 reps - 30 sec  hold - Supine Bridge with Resistance Band  - 2 x daily - 7 x weekly - 1-2 sets - 10 reps - 5-10 sec  hold - Hooklying Isometric Clamshell  -  2 x daily - 7 x  weekly - 1 sets - 10 reps - 3 sec  hold - Hooklying Hamstring Stretch with Strap  - 1 x daily - 7 x weekly - 1 sets - 3 reps - 30 sec  hold - Supine Sciatic Nerve Mobilization with Strap  - 1 x daily - 7 x weekly - 3 sets - 10 reps - Supine Sciatic Nerve Mobilization With Leg on Pillow  - 1 x daily - 7 x weekly - 3 sets - 10 reps - Supine Posterior Pelvic Tilt  - 1 x daily - 7 x weekly - 3 sets - 10 reps - Hip Flexor Stretch at Edge of Bed  - 2 x daily - 7 x weekly - 1 sets - 3 reps - 30 sec  hold - Supine Sciatic Nerve Glide  - 2 x daily - 7 x weekly - 1 sets - 10 reps - Prone on Elbows Stretch  - 2 x daily - 7 x weekly - 3 reps - 1-2 min hold - Prone Hip Extension with Pillow Under Abdomen  - 1 x daily - 7 x weekly - 3 sets - 10 reps  ASSESSMENT: Clinical impression:  Pt with decreased pain with cues for glute squeeze with hip extension and bridging exercises. PT educated pt on importance of remembering to activate glutes during ADLs and IADLs at home. Pt with good understanding   GOALS: Goals reviewed with patient? Yes  SHORT TERM GOALS: Target date: 07/15/2024  Independent in initial HEP  Baseline: Goal status: MET on 06/30/24  2.  Patient demonstrates proper transfers sit to sidelying to supine and reverse to decrease irritation through the LB/R hip  Baseline:  Goal status: Met on 07/03/24  3.  Patient reports performing consistent HEP on a daily basis at least 5 days/week Baseline:  Goal status: MET 08/10/24   LONG TERM GOALS: Target date: 09/21/2024  Decrease pain in LB and R hip by 50-75% allowing patient to participate in normal functional and recreational activities  Baseline:  Goal status: MET  2.  Patient reports awakening with LB and R hip pain no greater than 2/10 in the morning Baseline:  Goal status: MET  3.  4+/5 to 5/5 strength bilat LE's  Baseline:  Goal status: IN PROGRESS  4.  Patient reports ability to walk for 15-20 min with minimal to no increase in  baseline back and hip pain  Baseline:  Goal status: IN PROGRESS  5.  Patient demonstrates improvement in modified oswestry score by 15%  Baseline: eval - Modified Oswestry: 20/50; 40%  08/10/24: 14/50; 28% Goal status: on going   6.  Independent in advance HEP including aquatic therapy as indicated  Baseline:  Goal status: on going   PLAN:  PT FREQUENCY: 2x/week  PT DURATION: 8 weeks  PLANNED INTERVENTIONS: 97164- PT Re-evaluation, 97110-Therapeutic exercises, 97530- Therapeutic activity, [id number]- Neuromuscular re-education, 97535- Self Care, [id number]- Manual therapy, [id number]- Aquatic Therapy, Patient/Family education, Balance training, Taping, and Joint mobilization.  PLAN FOR NEXT SESSION: lumbar mobility, glute strength, hip mobility. Sciatic nerve mobilizations! Do more nerve glides-responded very well, stretching is still important to do for pain relief, progress core engagement, progress core engagement with LE movement  Teresa Spence, PT,DPT12/31/20259:29 AM  PHYSICAL THERAPY DISCHARGE SUMMARY  Visits from Start of Care: 17  Current functional level related to goals / functional outcomes: Improving strength, decreasing pain   Remaining deficits: See above   Education / Equipment: HEP   Patient  agrees to discharge. Patient goals were partially met. Patient is being discharged due to not returning since the last visit.  Teresa Spence, PT,DPT01/27/2610:15 AM   "

## 2024-09-09 NOTE — Telephone Encounter (Signed)
 PAP: Application for Bernadine has been submitted to [employer]), via fax

## 2024-09-14 NOTE — Telephone Encounter (Signed)
 [employer] requested POI for further review of PAP application for Jardiance  - resubmitted with POI for review.

## 2024-09-16 ENCOUNTER — Encounter: Payer: Self-pay | Admitting: Physician Assistant

## 2024-09-16 ENCOUNTER — Ambulatory Visit: Admitting: Physician Assistant

## 2024-09-16 VITALS — BP 132/72 | HR 81

## 2024-09-16 DIAGNOSIS — E559 Vitamin D deficiency, unspecified: Secondary | ICD-10-CM | POA: Diagnosis not present

## 2024-09-16 DIAGNOSIS — H259 Unspecified age-related cataract: Secondary | ICD-10-CM | POA: Diagnosis not present

## 2024-09-16 DIAGNOSIS — E785 Hyperlipidemia, unspecified: Secondary | ICD-10-CM | POA: Diagnosis not present

## 2024-09-16 DIAGNOSIS — Z7984 Long term (current) use of oral hypoglycemic drugs: Secondary | ICD-10-CM | POA: Diagnosis not present

## 2024-09-16 DIAGNOSIS — E11649 Type 2 diabetes mellitus with hypoglycemia without coma: Secondary | ICD-10-CM | POA: Diagnosis not present

## 2024-09-16 DIAGNOSIS — E1142 Type 2 diabetes mellitus with diabetic polyneuropathy: Secondary | ICD-10-CM

## 2024-09-16 DIAGNOSIS — M2550 Pain in unspecified joint: Secondary | ICD-10-CM

## 2024-09-16 DIAGNOSIS — I10 Essential (primary) hypertension: Secondary | ICD-10-CM | POA: Diagnosis not present

## 2024-09-16 DIAGNOSIS — R944 Abnormal results of kidney function studies: Secondary | ICD-10-CM

## 2024-09-16 LAB — POCT GLYCOSYLATED HEMOGLOBIN (HGB A1C): Hemoglobin A1C: 7.4 % — AB (ref 4.0–5.6)

## 2024-09-16 MED ORDER — FREESTYLE LIBRE 3 SENSOR MISC: 1.0000 | 11 refills | Status: AC

## 2024-09-16 MED ORDER — OZEMPIC (0.25 OR 0.5 MG/DOSE) 2 MG/3ML ~~LOC~~ SOPN: 0.5000 mg | PEN_INJECTOR | SUBCUTANEOUS | 0 refills | Status: AC

## 2024-09-16 NOTE — Progress Notes (Signed)
 "  Established Patient Office Visit  Subjective   Patient ID: November Sypher, female    DOB: March 19, 1951  Age: 74 y.o. MRN: [id number]  Chief Complaint  Patient presents with   Medical Management of Chronic Issues    HPI .Discussed the use of AI scribe software for clinical note transcription with the patient, who gave verbal consent to proceed.  History of Present Illness Deundra Furber is a 74 year old female with diabetes who presents for a three-month follow-up.  Glycemic control and diabetes management - Last hemoglobin A1c was 7.5; current A1c is 7.4. - Currently taking Jardiance , metformin , and Ozempic  (semaglutide  0.5 mg weekly). - Diet adherence has been poor over Thanksgiving and Christmas due to a busy schedule interfering with meal preparation. - Using Libre sensor for glucose monitoring; glucose levels have mostly remained in target range but she has been out for over a month.  - Experiences gastrointestinal discomfort with semaglutide , but tolerates medication overall. - Challenges with Jardiance  due to diuretic effect, which is inconvenient during travel and daily activities. She is not always compliant.   Musculoskeletal pain - Sciatic pain localized to the hip, worsens at night. - Attending physical therapy; finds relief with exercises such as clenching buttocks before sleep. - Arthritis-related pain in fingers; uses compression gloves at night and occasionally applies topical gels, but is cautious about use around her cat. - Stopped Celebrex  due to concern for impact on kidney function (GFR). - Managing pain primarily through physical therapy and exercises.  Ophthalmologic findings - History of cataracts; recent eye exam shows bilateral developing cataracts. - No diabetic retinopathy on recent retinal exams. - Vision has improved slightly, attributed to cataracts by her eye doctor. - Regular retinal exams have shown no issues.  Lifestyle and functional status -  Active in community and travels frequently. - Preparing for upcoming trip to [place] , anticipating a busy and tiring schedule. - Has a supportive social network, including a friend with whom she plans to travel.     ROS See HPI.    Objective:     BP 132/72   Pulse 81   SpO2 99%  BP Readings from Last 3 Encounters:  09/16/24 132/72  07/28/24 136/75  06/23/24 135/82   Wt Readings from Last 3 Encounters:  07/28/24 180 lb (81.6 kg)  06/23/24 180 lb 4.8 oz (81.8 kg)  06/02/24 181 lb (82.1 kg)      Physical Exam Constitutional:      Appearance: Normal appearance. She is obese.  HENT:     Head: Normocephalic.  Cardiovascular:     Rate and Rhythm: Normal rate and regular rhythm.  Pulmonary:     Effort: Pulmonary effort is normal.     Breath sounds: Normal breath sounds.  Musculoskeletal:     Right lower leg: No edema.     Left lower leg: No edema.  Neurological:     General: No focal deficit present.     Mental Status: She is oriented to person, place, and time.  Psychiatric:        Mood and Affect: Mood normal.    .. Lab Results  Component Value Date   HGBA1C 7.4 (A) 09/16/2024        The 10-year ASCVD risk score (Arnett DK, et al., 2019) is: 35.6%    Assessment & Plan:  .Geniene was seen today for medical management of chronic issues.  Diagnoses and all orders for this visit:  Uncontrolled type 2 diabetes mellitus with hypoglycemia,  unspecified hypoglycemia coma status (HCC) -     POCT HgB A1C -     Semaglutide ,0.25 or 0.5MG /DOS, (OZEMPIC , 0.25 OR 0.5 MG/DOSE,) 2 MG/3ML SOPN; Inject 0.5 mg into the skin once a week.  Hyperlipidemia LDL goal <70 -     Semaglutide ,0.25 or 0.5MG /DOS, (OZEMPIC , 0.25 OR 0.5 MG/DOSE,) 2 MG/3ML SOPN; Inject 0.5 mg into the skin once a week.  Essential hypertension  Vitamin D  deficiency  Age-related cataract of both eyes, unspecified age-related cataract type  Decreased GFR -     CMP14+EGFR  Type 2 diabetes mellitus  with diabetic polyneuropathy, without long-term current use of insulin (HCC) -     Continuous Glucose Sensor (FREESTYLE LIBRE 3 SENSOR) MISC; 1 Device by Does not apply route every 14 (fourteen) days.  Polyarthralgia    Assessment & Plan Type 2 diabetes mellitus, uncontrolled.  A1c improved to 7.4. Experiencing gastrointestinal side effects from semaglutide  but prefers current dose. Discussed diet and medication adherence to prevent complications. Motivated for lifestyle changes. - Continue Jardiance , metformin , and Ozempic . - Declined increase in ozempic . She wants to try to implement more lifestyle changes.  - Encouraged dietary improvements starting January. - Monitor blood glucose levels with Libre sensor. - BP very close to goal, on ACE.  - On statin - Foot and eye exam UTD - Flu and Pneumonia vaccine UTD.  - Follow up in 3 months  Essential hypertension Blood pressure elevated. No home monitoring currently. - Encouraged home blood pressure monitoring. - 2nd BP recheck in office improved.  - Continue hyzaar.   Age-related cataract, bilateral Developing cataracts with no diabetic retinopathy. Temporary vision improvement noted. - Continue regular eye exams.  Sciatica Chronic pain managed with physical therapy. Pain improves with movement and exercises. - Continue physical therapy for sciatica. - Encouraged use of exercises and pillow between legs as needed.  Hand osteoarthritis, bilateral Bilateral stiffness and pain managed with topical gels and compression gloves. Discussed alternative treatments. - Consider paraffin wax therapy for hand osteoarthritis. - Continue use of compression gloves at night.     Return in about 3 months (around 12/15/2024).    Stanly Si, PA-C  "

## 2024-09-17 LAB — CMP14+EGFR
ALT: 12 IU/L (ref 0–32)
AST: 17 IU/L (ref 0–40)
Albumin: 4.3 g/dL (ref 3.8–4.8)
Alkaline Phosphatase: 69 IU/L (ref 49–135)
BUN/Creatinine Ratio: 25 (ref 12–28)
BUN: 24 mg/dL (ref 8–27)
Bilirubin Total: 0.5 mg/dL (ref 0.0–1.2)
CO2: 23 mmol/L (ref 20–29)
Calcium: 9.6 mg/dL (ref 8.7–10.3)
Chloride: 99 mmol/L (ref 96–106)
Creatinine, Ser: 0.95 mg/dL (ref 0.57–1.00)
Globulin, Total: 2.3 g/dL (ref 1.5–4.5)
Glucose: 158 mg/dL — ABNORMAL HIGH (ref 70–99)
Potassium: 4.4 mmol/L (ref 3.5–5.2)
Sodium: 138 mmol/L (ref 134–144)
Total Protein: 6.6 g/dL (ref 6.0–8.5)
eGFR: 63 mL/min/1.73

## 2024-09-18 ENCOUNTER — Encounter: Payer: Self-pay | Admitting: Physician Assistant

## 2024-09-18 ENCOUNTER — Ambulatory Visit: Payer: Self-pay | Admitting: Physician Assistant

## 2024-09-18 NOTE — Progress Notes (Signed)
 Teresa Spence,   Kidney function back up! Good news.

## 2024-09-24 ENCOUNTER — Other Ambulatory Visit: Payer: Self-pay

## 2024-09-24 DIAGNOSIS — E1142 Type 2 diabetes mellitus with diabetic polyneuropathy: Secondary | ICD-10-CM

## 2024-09-24 NOTE — Telephone Encounter (Signed)
 PAP: Patient assistance application for Jardiance  has been approved by PAP Companies: [employer] from 09/10/2024 to 09/09/2025. Medication should be delivered to PAP Delivery: Home. For further shipping updates, please contact [employer] ([employer]) at [phone]. Patient ID is: [id number]

## 2024-09-24 NOTE — Progress Notes (Signed)
 "  09/24/2024  Patient ID: Teresa Spence, female   DOB: Feb 10, 1951, 74 y.o.   MRN: [id number]  Subjective/Objective Telephone visit to follow-up on management of chronic conditions  Diabetes: Current medications: metformin  1000mg  BID with meals, Jardiance  10mg  daily, Ozempic  0.5mg  weekly -Medications tried in the past: Farxiga  tried in the past and patient reports significant vaginal yeast infection while taking -Taking Ozempic  to 0.5mg  weekly and endorses tolerating mostly well with some occasional stomach upset -Receives Jardiance  through [facility] Cares PAP and has been approved for 2026 re-enrollment and recently received a 90 day refill -Will no longer receive Ozempic  through [facility] PAP based on program changes.  Patient states [facility] refilled Ozempic , and this was going to be $995 for a 90 day supply.  She did not pick up based on cost, but she does still have 6 weeks of medication left. -Patient typically uses Libre 3 for CGM but has not worn sensor in a while but plans to resume use  -Has not used finger sticks to check home BG, but does have testing supplies available to do so -ACEi/ARB for cardiorenal protection:  losartan /hydrochlorothiazide 100/25mg  daily -UACR 30-300 06/02/24 -Statin for ASCVD risk reduction:  rosuvastatin  20mg  daily  Lab Results  Component Value Date   HGBA1C 7.4 (A) 09/16/2024   HGBA1C 7.5 (A) 06/02/2024   HGBA1C 7.9 (A) 01/21/2024    Hypertension: Current medications:  losartan /hydrochlorothiazide 100/25mg  daily -Last OV BP was 132/72 on 1/7 -Discussion last visit regarding celecoxib  and how it affects BP and kidney function.  Patient stated it did not seem to be helping with pain, so medication was stopped.     Component Value Date/Time   NA 138 09/16/2024 0938   K 4.4 09/16/2024 0938   CL 99 09/16/2024 0938   CO2 23 09/16/2024 0938   GLUCOSE 158 (H) 09/16/2024 0938   GLUCOSE 148 (H) 11/20/2022 0933   BUN 24 09/16/2024 0938   CREATININE 0.95 09/16/2024 0938    CREATININE 0.94 11/20/2022 0933   CALCIUM  9.6 09/16/2024 0938   PROT 6.6 09/16/2024 0938   ALBUMIN 4.3 09/16/2024 0938   AST 17 09/16/2024 0938   ALT 12 09/16/2024 0938   ALKPHOS 69 09/16/2024 0938   BILITOT 0.5 09/16/2024 0938   EGFR 63 09/16/2024 0938   GFRNONAA 68 08/23/2020 0000     Hyperlipidemia Current medications:  rosuvastatin  20mg  daily     Component Value Date/Time   CHOL 197 06/02/2024 0957   TRIG 296 (H) 06/02/2024 0957   HDL 32 (L) 06/02/2024 0957   CHOLHDL 6.2 (H) 06/02/2024 0957   CHOLHDL 4.4 11/20/2022 0933   LDLCALC 113 (H) 06/02/2024 0957   LDLCALC 94 11/20/2022 0933   LABVLDL 52 (H) 06/02/2024 0957    Assessment/Plan:    Diabetes: -A1c not at goal of <7% -UACR is not at goal -LDL is not at goal of <70 -BP moderately controlled with goal of <130/80 -Patient is open to trying Trulicity and will likely qualify for [facility] Cares PAP.  I recommend starting with 1.5mg  weekly based on current Ozempic  dosing of 0.5mg  weekly and the fact that patient has enough medication to last until Trulicity should arrived if approved for PAP.  If provider is in agreement, I will coordinate with the medication assistance team to initiate the application process. -Goal to titrate Ozempic  and/or Jardiance  as tolerated to be able to stop metformin  -Resume use of Libre for CGM -Due for UACR at next PCP visit; can consider increasing Jardiance  to 25mg  daily  if needed and patient continues to tolerate 10mg  well  Hypertension: -Continue current regimen at this time -If BP consistently >130/80, consider addition of amlodipine  5mg  daily.  Increasing Jardiance  to 25mg  when able may also improve BP.  Hyperlipidemia: -Continue current regimen of rosuvastatin  20mg  daily at this time -I recommend lipid panel and CMP at next PCP visit to see how increased rosuvastatin  dose affects LDL and verify no changes in LFTs.  If LDL stil >70, consider further increasing to rosuvastatin  40mg   daily.  Follow Up Plan:  Will monitor progress of PAP and will follow-up accordingly   Channing DELENA Mealing, PharmD, DPLA  "

## 2024-09-25 ENCOUNTER — Telehealth: Payer: Self-pay

## 2024-09-25 NOTE — Progress Notes (Signed)
" ° °  09/25/2024  Patient ID: Teresa Spence, female   DOB: February 16, 1951, 74 y.o.   MRN: [id number]  Missed call/voicemail from patient endorsing confusion around enrollment status with [facility] Cares PAP for Jardiance .  Patient received a prescription of Jardiance  a few days ago with directions on how to refill, but then this week she received a letter stating her application for 2026 re-enrollment was denied based on [facility].  Contacted the medication assistance team to see if they can reach out to [facility] Cares to check on patient's enrollment status.  Channing DELENA Mealing, PharmD, DPLA  "

## 2024-09-30 NOTE — Progress Notes (Signed)
.  I have collaborated with the care management provider regarding care management and care coordination activities outlined in this encounter and have reviewed this encounter including documentation in the note and care plan. I am certifying that I agree with the content of this note and encounter as supervising physician.    Ok to switch ozempic  for trulicity.

## 2024-10-02 ENCOUNTER — Telehealth (HOSPITAL_BASED_OUTPATIENT_CLINIC_OR_DEPARTMENT_OTHER): Payer: Self-pay | Admitting: Physician Assistant

## 2024-10-02 ENCOUNTER — Ambulatory Visit: Payer: Self-pay

## 2024-10-02 NOTE — Telephone Encounter (Signed)
 FYI Only or Action Required?: Action required by provider: request for appointment.  Patient was last seen in primary care on 09/16/2024 by [name], PA-C.  Called Nurse Triage reporting Hip Pain.  Symptoms began several days ago.  Interventions attempted: Nothing.  Symptoms are: unchanged.Left hip, leg numbness,pain. Asking to see Dr. De Cuba, no availability. Has seen him before.Asking to be worked in after next Tuesday. Please advise pt.  Triage Disposition: See Physician Within 24 Hours  Patient/caregiver understands and will follow disposition?: Yes     Reason for Triage: Severe leg/hip pain impacting walking  Reason for Disposition  Numbness in a leg or foot (i.e., loss of sensation)  Answer Assessment - Initial Assessment Questions 1. LOCATION and RADIATION: Where is the pain located? Does the pain spread (shoot) anywhere else?     Left hip pain, numbness 2. QUALITY: What does the pain feel like?  (e.g., sharp, dull, aching, burning)     Feels numb 3. SEVERITY: How bad is the pain? What does it keep you from doing?   (Scale 1-10; or mild, moderate, severe)     N/a 4. ONSET: When did the pain start? Does it come and go, or is it there all the time?     Several days 5. WORK OR EXERCISE: Has there been any recent work or exercise that involved this part of the body?      no 6. CAUSE: What do you think is causing the hip pain?      unsure 7. AGGRAVATING FACTORS: What makes the hip pain worse? (e.g., walking, climbing stairs, running)     walking 8. OTHER SYMPTOMS: Do you have any other symptoms? (e.g., back pain, pain shooting down leg,  fever, rash)     no  Protocols used: Hip Pain-A-AH

## 2024-10-02 NOTE — Telephone Encounter (Signed)
 Appointment scheduled.

## 2024-10-02 NOTE — Telephone Encounter (Signed)
 Spoke with patient - states she had wanted to be worked in with Dr. Penne next week. She will contact their office for scheduling. Offered to provide her with this contact # but she states she does have this number and will call them for scheduling.

## 2024-10-02 NOTE — Telephone Encounter (Signed)
 Copied from CRM [id number]. Topic: Appointments - Scheduling Inquiry for Clinic >> Oct 02, 2024  3:55 PM Travis F wrote: Reason for CRM: Patient is calling in because she says she needs to make an appointment with Dr. De Cuba. Patient says she called previously and was told someone was going to send a message back to see If he could work her in. Patient says the note was sent to her PCP office instead of Dr. Everitt Cuba's and she still needs the appointment with him. Patient says a referral had already been sent for Dr. De Cuba. Please follow up with patient.

## 2024-10-06 ENCOUNTER — Telehealth: Payer: Self-pay

## 2024-10-06 NOTE — Telephone Encounter (Signed)
-----   Message from Channing DELENA Mealing sent at 10/01/2024  5:15 PM EST ----- Good afternoon!  We would like to change Ms. Common from Ozezmpic 0.5mg  weekly to Trulicit 1.5mg  weekly, and she should qualify for [employer] Cares PAP.  Could you all assist with this application process, please?  Her provider is Vermell Bologna, [place], with [facility].  Thank you!  Channing DELENA Mealing, PharmD, DPLA

## 2024-10-06 NOTE — Telephone Encounter (Signed)
 Staff onsite again on 10/08/24 to mail pt forms.

## 2024-10-08 ENCOUNTER — Other Ambulatory Visit (HOSPITAL_BASED_OUTPATIENT_CLINIC_OR_DEPARTMENT_OTHER)

## 2024-10-08 ENCOUNTER — Encounter (HOSPITAL_BASED_OUTPATIENT_CLINIC_OR_DEPARTMENT_OTHER): Payer: Self-pay | Admitting: Family Medicine

## 2024-10-08 ENCOUNTER — Ambulatory Visit (HOSPITAL_BASED_OUTPATIENT_CLINIC_OR_DEPARTMENT_OTHER)

## 2024-10-08 ENCOUNTER — Ambulatory Visit (INDEPENDENT_AMBULATORY_CARE_PROVIDER_SITE_OTHER): Admitting: Family Medicine

## 2024-10-08 VITALS — BP 123/70 | HR 86 | Resp 18 | Ht 66.5 in | Wt 182.0 lb

## 2024-10-08 DIAGNOSIS — M545 Low back pain, unspecified: Secondary | ICD-10-CM | POA: Diagnosis not present

## 2024-10-08 NOTE — Assessment & Plan Note (Signed)
 Patient continues to have issues with low back pain and associated left hip pain.  She also has some left leg numbness that continues.  She has not had any significant weakness, no new issues such as foot drop.  She will occasionally have some right sided sciatica.  She has been working with PT and focusing on conservative measures. On exam, patient is in no acute distress.  Antalgic gait in office today.  Very mild tenderness palpation through paraspinal muscles bilaterally in lumbar region.  Negative straight leg raise bilaterally.  Negative FABER and FADIR bilaterally.  Mild tenderness palpation over proximal aspect of IT band on the left side.  Normal strength testing for hip abduction and adduction bilaterally. Given continued symptoms despite conservative measures including physical therapy, recommend proceeding with initial x-rays today.  We will likely arrange for MRI to be completed following x-rays. Pending results of imaging, likely will plan for referral to spine specialist for further evaluation and recommendations.

## 2024-10-08 NOTE — Progress Notes (Signed)
" ° ° °  Procedures performed today:    None.  Independent interpretation of notes and tests performed by another provider:   None.  Brief History, Exam, Impression, and Recommendations:    BP 123/70 (BP Location: Right Arm, Patient Position: Sitting, Cuff Size: Normal)   Pulse 86   Resp 18   Ht 5' 6.5 (1.689 m)   Wt 182 lb (82.6 kg)   SpO2 92%   BMI 28.94 kg/m   Bilateral low back pain, unspecified chronicity, unspecified whether sciatica present Assessment & Plan: Patient continues to have issues with low back pain and associated left hip pain.  She also has some left leg numbness that continues.  She has not had any significant weakness, no new issues such as foot drop.  She will occasionally have some right sided sciatica.  She has been working with PT and focusing on conservative measures. On exam, patient is in no acute distress.  Antalgic gait in office today.  Very mild tenderness palpation through paraspinal muscles bilaterally in lumbar region.  Negative straight leg raise bilaterally.  Negative FABER and FADIR bilaterally.  Mild tenderness palpation over proximal aspect of IT band on the left side.  Normal strength testing for hip abduction and adduction bilaterally. Given continued symptoms despite conservative measures including physical therapy, recommend proceeding with initial x-rays today.  We will likely arrange for MRI to be completed following x-rays. Pending results of imaging, likely will plan for referral to spine specialist for further evaluation and recommendations.  Orders: -     DG Lumbar Spine Complete; Future  Return if symptoms worsen or fail to improve.   ___________________________________________ Rayshell Goecke de Cuba, MD, ABFM, [facility] "

## 2024-10-09 NOTE — Telephone Encounter (Signed)
 PAP application for (Trulicity/[facility] Cares)  has been mailed to pt home. I will fax PCP pages once I receive pt pages.

## 2024-10-14 ENCOUNTER — Ambulatory Visit (HOSPITAL_BASED_OUTPATIENT_CLINIC_OR_DEPARTMENT_OTHER): Payer: Self-pay | Admitting: Family Medicine

## 2024-10-14 ENCOUNTER — Encounter (HOSPITAL_BASED_OUTPATIENT_CLINIC_OR_DEPARTMENT_OTHER): Payer: Self-pay | Admitting: Family Medicine

## 2024-10-14 DIAGNOSIS — M545 Low back pain, unspecified: Secondary | ICD-10-CM

## 2024-10-14 DIAGNOSIS — M47816 Spondylosis without myelopathy or radiculopathy, lumbar region: Secondary | ICD-10-CM

## 2024-10-14 DIAGNOSIS — M4316 Spondylolisthesis, lumbar region: Secondary | ICD-10-CM

## 2024-10-15 ENCOUNTER — Encounter: Payer: Self-pay | Admitting: Physician Assistant

## 2024-11-02 ENCOUNTER — Other Ambulatory Visit

## 2024-11-02 ENCOUNTER — Other Ambulatory Visit (HOSPITAL_BASED_OUTPATIENT_CLINIC_OR_DEPARTMENT_OTHER)

## 2024-11-02 ENCOUNTER — Ambulatory Visit (HOSPITAL_BASED_OUTPATIENT_CLINIC_OR_DEPARTMENT_OTHER)

## 2024-12-02 ENCOUNTER — Ambulatory Visit: Admitting: Orthopedic Surgery

## 2024-12-15 ENCOUNTER — Ambulatory Visit: Admitting: Physician Assistant

## 2025-07-29 ENCOUNTER — Ambulatory Visit
